# Patient Record
Sex: Male | Born: 1945
Health system: Southern US, Community
[De-identification: ages and names within clinical notes are randomized; demographics above are authoritative.]

## PROBLEM LIST (undated history)

## (undated) DIAGNOSIS — C4492 Squamous cell carcinoma of skin, unspecified: Secondary | ICD-10-CM

## (undated) DIAGNOSIS — R0989 Other specified symptoms and signs involving the circulatory and respiratory systems: Secondary | ICD-10-CM

## (undated) DIAGNOSIS — H269 Unspecified cataract: Secondary | ICD-10-CM

## (undated) DIAGNOSIS — E785 Hyperlipidemia, unspecified: Secondary | ICD-10-CM

## (undated) DIAGNOSIS — C4491 Basal cell carcinoma of skin, unspecified: Secondary | ICD-10-CM

## (undated) DIAGNOSIS — N529 Male erectile dysfunction, unspecified: Secondary | ICD-10-CM

## (undated) DIAGNOSIS — K219 Gastro-esophageal reflux disease without esophagitis: Secondary | ICD-10-CM

## (undated) DIAGNOSIS — G4730 Sleep apnea, unspecified: Secondary | ICD-10-CM

## (undated) DIAGNOSIS — E119 Type 2 diabetes mellitus without complications: Secondary | ICD-10-CM

## (undated) DIAGNOSIS — H409 Unspecified glaucoma: Secondary | ICD-10-CM

## (undated) DIAGNOSIS — T7840XA Allergy, unspecified, initial encounter: Secondary | ICD-10-CM

## (undated) DIAGNOSIS — I10 Essential (primary) hypertension: Secondary | ICD-10-CM

## (undated) HISTORY — DX: Type 2 diabetes mellitus without complications: E11.9

## (undated) HISTORY — PX: TONSILLECTOMY: SUR1361

## (undated) HISTORY — DX: Unspecified glaucoma: H40.9

## (undated) HISTORY — PX: POLYPECTOMY: SHX149

## (undated) HISTORY — DX: Essential (primary) hypertension: I10

## (undated) HISTORY — DX: Unspecified cataract: H26.9

## (undated) HISTORY — DX: Gastro-esophageal reflux disease without esophagitis: K21.9

## (undated) HISTORY — PX: CYSTECTOMY: SUR359

## (undated) HISTORY — PX: COLONOSCOPY: SHX174

## (undated) HISTORY — DX: Squamous cell carcinoma of skin, unspecified: C44.92

## (undated) HISTORY — DX: Other specified symptoms and signs involving the circulatory and respiratory systems: R09.89

## (undated) HISTORY — DX: Allergy, unspecified, initial encounter: T78.40XA

## (undated) HISTORY — PX: PILONIDAL CYST EXCISION: SHX744

## (undated) HISTORY — DX: Basal cell carcinoma of skin, unspecified: C44.91

## (undated) HISTORY — PX: INGUINAL HERNIA REPAIR: SUR1180

## (undated) HISTORY — DX: Male erectile dysfunction, unspecified: N52.9

## (undated) HISTORY — DX: Hyperlipidemia, unspecified: E78.5

---

## 2001-11-10 ENCOUNTER — Ambulatory Visit (HOSPITAL_BASED_OUTPATIENT_CLINIC_OR_DEPARTMENT_OTHER): Admission: RE | Admit: 2001-11-10 | Discharge: 2001-11-10 | Payer: Self-pay | Admitting: Family Medicine

## 2001-11-15 ENCOUNTER — Encounter: Payer: Self-pay | Admitting: Surgery

## 2001-11-17 ENCOUNTER — Ambulatory Visit (HOSPITAL_COMMUNITY): Admission: RE | Admit: 2001-11-17 | Discharge: 2001-11-17 | Payer: Self-pay | Admitting: Surgery

## 2003-12-26 ENCOUNTER — Ambulatory Visit: Payer: Self-pay | Admitting: Internal Medicine

## 2004-07-01 ENCOUNTER — Ambulatory Visit: Payer: Self-pay | Admitting: Internal Medicine

## 2004-07-08 ENCOUNTER — Ambulatory Visit: Payer: Self-pay | Admitting: Internal Medicine

## 2004-07-22 ENCOUNTER — Ambulatory Visit: Payer: Self-pay | Admitting: Internal Medicine

## 2004-12-05 ENCOUNTER — Ambulatory Visit: Payer: Self-pay | Admitting: Internal Medicine

## 2004-12-20 ENCOUNTER — Ambulatory Visit: Payer: Self-pay | Admitting: Internal Medicine

## 2005-01-22 ENCOUNTER — Ambulatory Visit: Payer: Self-pay | Admitting: Internal Medicine

## 2005-04-21 ENCOUNTER — Ambulatory Visit (HOSPITAL_BASED_OUTPATIENT_CLINIC_OR_DEPARTMENT_OTHER): Admission: RE | Admit: 2005-04-21 | Discharge: 2005-04-21 | Payer: Self-pay | Admitting: Surgery

## 2005-04-27 ENCOUNTER — Encounter (INDEPENDENT_AMBULATORY_CARE_PROVIDER_SITE_OTHER): Payer: Self-pay | Admitting: *Deleted

## 2005-04-27 ENCOUNTER — Ambulatory Visit (HOSPITAL_BASED_OUTPATIENT_CLINIC_OR_DEPARTMENT_OTHER): Admission: RE | Admit: 2005-04-27 | Discharge: 2005-04-27 | Payer: Self-pay | Admitting: Surgery

## 2005-11-26 ENCOUNTER — Ambulatory Visit: Payer: Self-pay | Admitting: Internal Medicine

## 2006-01-28 ENCOUNTER — Ambulatory Visit: Payer: Self-pay | Admitting: Internal Medicine

## 2006-01-28 LAB — CONVERTED CEMR LAB
ALT: 47 units/L — ABNORMAL HIGH (ref 0–40)
AST: 32 units/L (ref 0–37)
Albumin: 4.2 g/dL (ref 3.5–5.2)
Alkaline Phosphatase: 33 units/L — ABNORMAL LOW (ref 39–117)
BUN: 19 mg/dL (ref 6–23)
CO2: 26 meq/L (ref 19–32)
Calcium: 9.2 mg/dL (ref 8.4–10.5)
Chloride: 104 meq/L (ref 96–112)
Chol/HDL Ratio, serum: 5.8
Cholesterol: 191 mg/dL (ref 0–200)
Creatinine, Ser: 1.2 mg/dL (ref 0.4–1.5)
GFR calc non Af Amer: 66 mL/min
Glomerular Filtration Rate, Af Am: 79 mL/min/{1.73_m2}
Glucose, Bld: 103 mg/dL — ABNORMAL HIGH (ref 70–99)
HCT: 42.7 % (ref 39.0–52.0)
HDL: 32.8 mg/dL — ABNORMAL LOW (ref >39.0)
Hemoglobin: 14.5 g/dL (ref 13.0–17.0)
LDL Cholesterol: 135 mg/dL — ABNORMAL HIGH (ref 0–99)
MCHC: 33.9 g/dL (ref 30.0–36.0)
MCV: 94.5 fL (ref 78.0–100.0)
PSA: 1.13 ng/mL (ref 0.10–4.00)
Platelets: 219 10*3/uL (ref 150–400)
Potassium: 3.5 meq/L (ref 3.5–5.1)
RBC: 4.52 M/uL (ref 4.22–5.81)
RDW: 11.9 % (ref 11.5–14.6)
Sodium: 138 meq/L (ref 135–145)
TSH: 0.88 microintl units/mL (ref 0.35–5.50)
Testosterone, total: 2.8074 ng/mL — ABNORMAL LOW
Total Bilirubin: 0.8 mg/dL (ref 0.3–1.2)
Total Protein: 6.8 g/dL (ref 6.0–8.3)
Triglyceride fasting, serum: 118 mg/dL (ref 0–149)
VLDL: 24 mg/dL (ref 0–40)
WBC: 7.9 10*3/uL (ref 4.5–10.5)

## 2006-02-16 ENCOUNTER — Ambulatory Visit: Payer: Self-pay

## 2006-06-24 ENCOUNTER — Ambulatory Visit: Payer: Self-pay | Admitting: Family Medicine

## 2006-06-24 LAB — CONVERTED CEMR LAB
Basophils Absolute: 0.1 10*3/uL (ref 0.0–0.1)
Basophils Relative: 1 % (ref 0.0–1.0)
Eosinophils Absolute: 0.7 10*3/uL — ABNORMAL HIGH (ref 0.0–0.6)
Eosinophils Relative: 9.2 % — ABNORMAL HIGH (ref 0.0–5.0)
HCT: 39.7 % (ref 39.0–52.0)
Hemoglobin: 14.1 g/dL (ref 13.0–17.0)
Lymphocytes Relative: 31.4 % (ref 12.0–46.0)
MCHC: 35.4 g/dL (ref 30.0–36.0)
MCV: 92.2 fL (ref 78.0–100.0)
Monocytes Absolute: 0.7 10*3/uL (ref 0.2–0.7)
Monocytes Relative: 9.2 % (ref 3.0–11.0)
Neutro Abs: 3.6 10*3/uL (ref 1.4–7.7)
Neutrophils Relative %: 49.2 % (ref 43.0–77.0)
Platelets: 209 10*3/uL (ref 150–400)
RBC: 4.31 M/uL (ref 4.22–5.81)
RDW: 12.1 % (ref 11.5–14.6)
WBC: 7.5 10*3/uL (ref 4.5–10.5)

## 2006-06-29 DIAGNOSIS — E785 Hyperlipidemia, unspecified: Secondary | ICD-10-CM | POA: Insufficient documentation

## 2006-06-29 DIAGNOSIS — I10 Essential (primary) hypertension: Secondary | ICD-10-CM | POA: Insufficient documentation

## 2007-02-14 ENCOUNTER — Telehealth (INDEPENDENT_AMBULATORY_CARE_PROVIDER_SITE_OTHER): Payer: Self-pay | Admitting: *Deleted

## 2007-03-03 ENCOUNTER — Ambulatory Visit: Payer: Self-pay | Admitting: Internal Medicine

## 2007-03-05 ENCOUNTER — Telehealth (INDEPENDENT_AMBULATORY_CARE_PROVIDER_SITE_OTHER): Payer: Self-pay | Admitting: *Deleted

## 2007-03-05 ENCOUNTER — Ambulatory Visit: Payer: Self-pay | Admitting: Family Medicine

## 2007-03-09 ENCOUNTER — Telehealth: Payer: Self-pay | Admitting: Internal Medicine

## 2007-03-11 ENCOUNTER — Ambulatory Visit: Payer: Self-pay | Admitting: Internal Medicine

## 2007-03-16 ENCOUNTER — Encounter (INDEPENDENT_AMBULATORY_CARE_PROVIDER_SITE_OTHER): Payer: Self-pay | Admitting: *Deleted

## 2007-04-15 ENCOUNTER — Telehealth (INDEPENDENT_AMBULATORY_CARE_PROVIDER_SITE_OTHER): Payer: Self-pay | Admitting: *Deleted

## 2007-07-08 ENCOUNTER — Encounter (INDEPENDENT_AMBULATORY_CARE_PROVIDER_SITE_OTHER): Payer: Self-pay | Admitting: *Deleted

## 2007-08-01 ENCOUNTER — Ambulatory Visit: Payer: Self-pay | Admitting: Internal Medicine

## 2007-11-23 ENCOUNTER — Ambulatory Visit: Payer: Self-pay | Admitting: Internal Medicine

## 2008-02-06 ENCOUNTER — Encounter (INDEPENDENT_AMBULATORY_CARE_PROVIDER_SITE_OTHER): Payer: Self-pay | Admitting: *Deleted

## 2008-02-21 ENCOUNTER — Ambulatory Visit: Payer: Self-pay | Admitting: Internal Medicine

## 2008-04-19 ENCOUNTER — Ambulatory Visit: Payer: Self-pay | Admitting: Internal Medicine

## 2008-09-18 ENCOUNTER — Ambulatory Visit: Payer: Self-pay | Admitting: Internal Medicine

## 2008-09-18 LAB — HM COLONOSCOPY: HM Colonoscopy: NORMAL

## 2008-09-20 ENCOUNTER — Ambulatory Visit: Payer: Self-pay | Admitting: Internal Medicine

## 2008-09-21 ENCOUNTER — Encounter (INDEPENDENT_AMBULATORY_CARE_PROVIDER_SITE_OTHER): Payer: Self-pay | Admitting: *Deleted

## 2008-09-25 ENCOUNTER — Telehealth (INDEPENDENT_AMBULATORY_CARE_PROVIDER_SITE_OTHER): Payer: Self-pay | Admitting: *Deleted

## 2008-09-25 LAB — CONVERTED CEMR LAB
ALT: 38 units/L (ref 0–53)
AST: 24 units/L (ref 0–37)
BUN: 19 mg/dL (ref 6–23)
Basophils Absolute: 0 10*3/uL (ref 0.0–0.1)
Basophils Relative: 0.1 % (ref 0.0–3.0)
CO2: 27 meq/L (ref 19–32)
Calcium: 9 mg/dL (ref 8.4–10.5)
Chloride: 106 meq/L (ref 96–112)
Cholesterol: 173 mg/dL (ref 0–200)
Creatinine, Ser: 1.1 mg/dL (ref 0.4–1.5)
Eosinophils Absolute: 0.6 10*3/uL (ref 0.0–0.7)
Eosinophils Relative: 8 % — ABNORMAL HIGH (ref 0.0–5.0)
GFR calc non Af Amer: 71.79 mL/min (ref >60)
Glucose, Bld: 99 mg/dL (ref 70–99)
HCT: 43.5 % (ref 39.0–52.0)
HDL: 27.9 mg/dL — ABNORMAL LOW (ref >39.00)
Hemoglobin: 14.8 g/dL (ref 13.0–17.0)
LDL Cholesterol: 121 mg/dL — ABNORMAL HIGH (ref 0–99)
Lymphocytes Relative: 33.2 % (ref 12.0–46.0)
Lymphs Abs: 2.7 10*3/uL (ref 0.7–4.0)
MCHC: 34 g/dL (ref 30.0–36.0)
MCV: 94.1 fL (ref 78.0–100.0)
Monocytes Absolute: 1 10*3/uL (ref 0.1–1.0)
Monocytes Relative: 12.9 % — ABNORMAL HIGH (ref 3.0–12.0)
Neutro Abs: 3.7 10*3/uL (ref 1.4–7.7)
Neutrophils Relative %: 45.8 % (ref 43.0–77.0)
PSA: 1.01 ng/mL (ref 0.10–4.00)
Platelets: 184 10*3/uL (ref 150.0–400.0)
Potassium: 3.8 meq/L (ref 3.5–5.1)
RBC: 4.63 M/uL (ref 4.22–5.81)
RDW: 11.9 % (ref 11.5–14.6)
Sodium: 139 meq/L (ref 135–145)
TSH: 1.11 microintl units/mL (ref 0.35–5.50)
Total CHOL/HDL Ratio: 6
Triglycerides: 123 mg/dL (ref 0.0–149.0)
VLDL: 24.6 mg/dL (ref 0.0–40.0)
WBC: 8 10*3/uL (ref 4.5–10.5)

## 2008-10-30 ENCOUNTER — Ambulatory Visit: Payer: Self-pay | Admitting: Internal Medicine

## 2009-09-26 ENCOUNTER — Telehealth (INDEPENDENT_AMBULATORY_CARE_PROVIDER_SITE_OTHER): Payer: Self-pay | Admitting: *Deleted

## 2009-10-09 ENCOUNTER — Ambulatory Visit: Payer: Self-pay | Admitting: Internal Medicine

## 2009-10-09 DIAGNOSIS — L723 Sebaceous cyst: Secondary | ICD-10-CM | POA: Insufficient documentation

## 2009-10-09 DIAGNOSIS — N529 Male erectile dysfunction, unspecified: Secondary | ICD-10-CM | POA: Insufficient documentation

## 2009-10-11 ENCOUNTER — Ambulatory Visit: Payer: Self-pay | Admitting: Internal Medicine

## 2009-10-15 ENCOUNTER — Telehealth: Payer: Self-pay | Admitting: Internal Medicine

## 2009-10-17 LAB — CONVERTED CEMR LAB
ALT: 31 units/L (ref 0–53)
AST: 21 units/L (ref 0–37)
Albumin: 4.1 g/dL (ref 3.5–5.2)
Alkaline Phosphatase: 33 units/L — ABNORMAL LOW (ref 39–117)
BUN: 17 mg/dL (ref 6–23)
Basophils Absolute: 0.1 10*3/uL (ref 0.0–0.1)
Basophils Relative: 0.8 % (ref 0.0–3.0)
Bilirubin, Direct: 0.2 mg/dL (ref 0.0–0.3)
CO2: 26 meq/L (ref 19–32)
Calcium: 8.7 mg/dL (ref 8.4–10.5)
Chloride: 102 meq/L (ref 96–112)
Cholesterol: 185 mg/dL (ref 0–200)
Creatinine, Ser: 1.3 mg/dL (ref 0.4–1.5)
Eosinophils Absolute: 0.5 10*3/uL (ref 0.0–0.7)
Eosinophils Relative: 6.5 % — ABNORMAL HIGH (ref 0.0–5.0)
GFR calc non Af Amer: 60.61 mL/min (ref >60)
Glucose, Bld: 91 mg/dL (ref 70–99)
HCT: 40.7 % (ref 39.0–52.0)
HDL: 32.4 mg/dL — ABNORMAL LOW (ref >39.00)
Hemoglobin: 14 g/dL (ref 13.0–17.0)
LDL Cholesterol: 121 mg/dL — ABNORMAL HIGH (ref 0–99)
Lymphocytes Relative: 30.8 % (ref 12.0–46.0)
Lymphs Abs: 2.5 10*3/uL (ref 0.7–4.0)
MCHC: 34.4 g/dL (ref 30.0–36.0)
MCV: 94.8 fL (ref 78.0–100.0)
Monocytes Absolute: 1 10*3/uL (ref 0.1–1.0)
Monocytes Relative: 12.6 % — ABNORMAL HIGH (ref 3.0–12.0)
Neutro Abs: 4 10*3/uL (ref 1.4–7.7)
Neutrophils Relative %: 49.3 % (ref 43.0–77.0)
PSA: 1.09 ng/mL (ref 0.10–4.00)
Platelets: 190 10*3/uL (ref 150.0–400.0)
Potassium: 4.2 meq/L (ref 3.5–5.1)
RBC: 4.29 M/uL (ref 4.22–5.81)
RDW: 12.8 % (ref 11.5–14.6)
Sodium: 135 meq/L (ref 135–145)
TSH: 1 microintl units/mL (ref 0.35–5.50)
Total Bilirubin: 0.8 mg/dL (ref 0.3–1.2)
Total CHOL/HDL Ratio: 6
Total Protein: 6.3 g/dL (ref 6.0–8.3)
Triglycerides: 159 mg/dL — ABNORMAL HIGH (ref 0.0–149.0)
VLDL: 31.8 mg/dL (ref 0.0–40.0)
WBC: 8.2 10*3/uL (ref 4.5–10.5)

## 2010-03-09 LAB — CONVERTED CEMR LAB
ALT: 33 units/L (ref 0–53)
AST: 19 units/L (ref 0–37)
Albumin: 4.3 g/dL (ref 3.5–5.2)
Alkaline Phosphatase: 37 units/L — ABNORMAL LOW (ref 39–117)
BUN: 16 mg/dL (ref 6–23)
Basophils Absolute: 0 10*3/uL (ref 0.0–0.1)
Basophils Relative: 0 % (ref 0.0–1.0)
Bilirubin Urine: NEGATIVE
Bilirubin, Direct: 0.1 mg/dL (ref 0.0–0.3)
Blood in Urine, dipstick: NEGATIVE
CO2: 29 meq/L (ref 19–32)
Calcium: 9.4 mg/dL (ref 8.4–10.5)
Chloride: 104 meq/L (ref 96–112)
Cholesterol: 187 mg/dL (ref 0–200)
Creatinine, Ser: 1.1 mg/dL (ref 0.4–1.5)
Eosinophils Absolute: 0.3 10*3/uL (ref 0.0–0.6)
Eosinophils Relative: 1.8 % (ref 0.0–5.0)
GFR calc Af Amer: 88 mL/min
GFR calc non Af Amer: 72 mL/min
Glucose, Bld: 94 mg/dL (ref 70–99)
Glucose, Urine, Semiquant: NEGATIVE
HCT: 44.6 % (ref 39.0–52.0)
HDL: 31.8 mg/dL — ABNORMAL LOW (ref >39.0)
Hemoglobin: 15.3 g/dL (ref 13.0–17.0)
Ketones, urine, test strip: NEGATIVE
LDL Cholesterol: 131 mg/dL — ABNORMAL HIGH (ref 0–99)
Lymphocytes Relative: 35 % (ref 12.0–46.0)
MCHC: 34.4 g/dL (ref 30.0–36.0)
MCV: 91.8 fL (ref 78.0–100.0)
Monocytes Absolute: 1.8 10*3/uL — ABNORMAL HIGH (ref 0.2–0.7)
Monocytes Relative: 11.8 % — ABNORMAL HIGH (ref 3.0–11.0)
Neutro Abs: 7.6 10*3/uL (ref 1.4–7.7)
Neutrophils Relative %: 51.4 % (ref 43.0–77.0)
Nitrite: NEGATIVE
PSA: 1.68 ng/mL (ref 0.10–4.00)
Platelets: 280 10*3/uL (ref 150–400)
Potassium: 4 meq/L (ref 3.5–5.1)
Protein, U semiquant: NEGATIVE
RBC: 4.85 M/uL (ref 4.22–5.81)
RDW: 11.8 % (ref 11.5–14.6)
Sodium: 140 meq/L (ref 135–145)
Specific Gravity, Urine: 1.025
TSH: 1.39 microintl units/mL (ref 0.35–5.50)
Total Bilirubin: 1 mg/dL (ref 0.3–1.2)
Total CHOL/HDL Ratio: 5.9
Total Protein: 7.1 g/dL (ref 6.0–8.3)
Triglycerides: 120 mg/dL (ref 0–149)
Urobilinogen, UA: NEGATIVE
VLDL: 24 mg/dL (ref 0–40)
WBC Urine, dipstick: NEGATIVE
WBC: 14.9 10*3/uL — ABNORMAL HIGH (ref 4.5–10.5)
pH: 5

## 2010-03-11 NOTE — Progress Notes (Signed)
Summary: SURGICAL REFERRAL  ---- Converted from flag ---- ---- 10/14/2009 10:32 AM, Jose E. Paz MD wrote: in the back, by the shoulder blade  ---- 10/11/2009 2:29 PM, Magdalen Spatz [facility] wrote: Dr. Drue Novel,  The Sebaceous Cyst, is it on the patient's back, or on the back of his knee?  The referral says back, the office notes have back in one section, then "knee back" in another.  Thank you, Renee ------------------------------

## 2010-03-11 NOTE — Progress Notes (Signed)
Summary: refills   Phone Note Outgoing Call   Summary of Call: ok #30 days and 1 RF all meds tell patient is due fpr a CPX, schedule one no further RF w/o OV Jose E. Paz MD  September 26, 2009 1:00 PM   Follow-up for Phone Call        refilled all of pts meds- will you schedule cpx. Army Fossa CMA  September 26, 2009 1:06 PM   Additional Follow-up for Phone Call Additional follow up Details #1::        called home number but pt was not home. will try again later.Lavell Islam  September 26, 2009 3:11 PM    Additional Follow-up for Phone Call Additional follow up Details #2::    pt has appt for cpx 8/31.Lavell Islam  October 01, 2009 11:57 AM

## 2010-03-11 NOTE — Assessment & Plan Note (Signed)
Summary: cpx/kn   Vital Signs:  Patient profile:   65 year old male Height:      69 inches Weight:      232.38 pounds BMI:     34.44 Pulse rate:   66 / minute Pulse rhythm:   regular BP sitting:   142 / 80  (left arm) Cuff size:   large  Vitals Entered By: Army Fossa CMA (October 09, 2009 1:23 PM) CC: CPX: not fasting   History of Present Illness: CPX has a sebaceous cyst in the back that is giving him problems, like a referral for excision  Preventive Screening-Counseling & Management  Caffeine-Diet-Exercise     Does Patient Exercise: yes     Type of exercise: walking- 2 miles     Times/week: 7  Current Medications (verified): 1)  Amlodipine Besylate 5 Mg  Tabs (Amlodipine Besylate) .Marland Kitchen.. 1 By Mouth Once Daily 2)  Avapro 300 Mg  Tabs (Irbesartan) .Marland Kitchen.. 1 By Mouth Once Daily 3)  Tricor 145 Mg  Tabs (Fenofibrate) .Marland Kitchen.. 1 By Mouth Once Daily 4)  Metoprolol Tartrate 50 Mg  Tabs (Metoprolol Tartrate) .Marland Kitchen.. 1 By Mouth Bid  Allergies (verified): No Known Drug Allergies  Past History:  Past Medical History: Reviewed history from 03/11/2007 and no changes required. Hyperlipidemia Hypertension decreased L carotid pulse (?) : Nl carotidu/s 02-2006  Past Surgical History: Reviewed history from 02/21/2008 and no changes required. SLEEP STUDY (2003): neg Pilonidal cyst Inguinal herniorrhaphy  (L)   Family History: M - deceased age 64 pneumonia/dementia F - deceased age 26 coronary insuff CAD - GF, F Valve replacement (due to Ca++) -- F age 12s  DM - no colon ca - no prostate ca - no  Social History: Married 2 kids 5 grandkids retired  Alcohol use-yes (couple beers a night) Never Smoked Drug use-no Regular exercise-yes (walks every  day ) exercise-- walks almost daily, swimms 2-3 /weekDoes Patient Exercise:  yes  Review of Systems General:  Denies fatigue, fever, and weight loss. CV:  Denies chest pain or discomfort and swelling of feet. Resp:   Denies cough and shortness of breath. GI:  Denies bloody stools. GU:  Denies dysuria, hematuria, urinary frequency, and urinary hesitancy; some drecreased libido and ED nocturia x 3 , not bother by that . Psych:  Denies anxiety and depression.  Physical Exam  General:  alert and well-developed.   Neck:  no masses and no thyromegaly.   Lungs:  normal respiratory effort, no intercostal retractions, no accessory muscle use, and normal breath sounds.   Heart:  normal rate, regular rhythm, no murmur, no gallop, and no rub.   Abdomen:  soft, non-tender, no distention, no masses, no guarding, and no rigidity.   Rectal:  external hemorrhoid  noted. Normal sphincter tone. No rectal masses or tenderness. hemocult neg Prostate:  Prostate gland firm and smooth, no enlargement, nodularity, tenderness, mass, asymmetry or induration. Extremities:  no edema Skin:  has a mass consistent with a sebaceous cyst in the knee back, size approximately 1 inch. Has similar but smaller cyst proximal from it  Psych:  Cognition and judgment appear intact. Alert and cooperative with normal attention span and concentration.     Impression & Recommendations:  Problem # 1:  HEALTH SCREENING (ICD-V70.0) Td 09-2008 Zostavax 02-2008 flu shot --today  Colonoscopy:    Date:  01/09/2002    Next Due:  01/2012   Results:  normal  hemocult neg today, no iFOB available today, advised him to call  in 1 month   continue with his healthy lifestyle, add ASA   Problem # 2:  HYPERTENSION (ICD-401.9) at goal  His updated medication list for this problem includes:    Amlodipine Besylate 5 Mg Tabs (Amlodipine besylate) .Marland Kitchen... 1 by mouth once daily    Avapro 300 Mg Tabs (Irbesartan) .Marland Kitchen... 1 by mouth once daily    Metoprolol Tartrate 50 Mg Tabs (Metoprolol tartrate) .Marland Kitchen... 1 by mouth bid  BP today: 142/80 Prior BP: 126/80 (09/18/2008)  Labs Reviewed: K+: 3.8 (09/20/2008) Creat: : 1.1 (09/20/2008)   Chol: 173 (09/20/2008)    HDL: 27.90 (09/20/2008)   LDL: 121 (09/20/2008)   TG: 123.0 (09/20/2008)  Problem # 3:  HYPERLIPIDEMIA (ICD-272.4)  His updated medication list for this problem includes:    Tricor 145 Mg Tabs (Fenofibrate) .Marland Kitchen... 1 by mouth once daily  Labs Reviewed: SGOT: 24 (09/20/2008)   SGPT: 38 (09/20/2008)   HDL:27.90 (09/20/2008), 31.8 (03/11/2007)  LDL:121 (09/20/2008), 131 (03/11/2007)  Chol:173 (09/20/2008), 187 (03/11/2007)  Trig:123.0 (09/20/2008), 120 (03/11/2007)  Problem # 4:  SEBACEOUS CYST (ICD-706.2)  Referred to Dr. Margaree Mackintosh that knows  the patient already  Orders: Surgical Referral (Surgery)  Problem # 5:  ERECTILE DYSFUNCTION, ORGANIC (ICD-607.84) some decreased libido and ED Patient believes related to blood pressure medication, offered   to switch around his medicines to help him. The patient states is not a major problem, prefers to stay on the same medicines and start fish oil daily which helped in the past  Complete Medication List: 1)  Amlodipine Besylate 5 Mg Tabs (Amlodipine besylate) .Marland Kitchen.. 1 by mouth once daily 2)  Avapro 300 Mg Tabs (Irbesartan) .Marland Kitchen.. 1 by mouth once daily 3)  Tricor 145 Mg Tabs (Fenofibrate) .Marland Kitchen.. 1 by mouth once daily 4)  Metoprolol Tartrate 50 Mg Tabs (Metoprolol tartrate) .Marland Kitchen.. 1 by mouth bid 5)  Aspirin 81 Mg Tbec (Aspirin) .Marland Kitchen.. 1 a day  Other Orders: Admin 1st Vaccine (45409) Flu Vaccine 58yrs + (81191)  Patient Instructions: 1)  please come back fasting within one week 2)  FLP, BMP, CBC, LFTs, TSH, PSA----dx V70 3)  Check your blood pressure 2 or 3 times a week. If it is more than 140/85 consistently,please let us know  4)  Please schedule a follow-up appointment in 1 year.  Prescriptions: METOPROLOL TARTRATE 50 MG  TABS (METOPROLOL TARTRATE) 1 by mouth bid  #180 x 3   Entered and Authorized by:   Elita Quick E. Paz MD   Signed by:   Nolon Rod. Paz MD on 10/09/2009   Method used:   Electronically to        [facility] [phone]* (retail)        [address]       [place], [place]  [address]       Ph: [phone]       Fax: [phone]   RxID:   [id number] TRICOR 145 MG  TABS (FENOFIBRATE) 1 by mouth once daily  #90 x 3   Entered and Authorized by:   Nolon Rod. Paz MD   Signed by:   Nolon Rod. Paz MD on 10/09/2009   Method used:   Electronically to        [facility] [phone]* (retail)       [address]       [place], [place]  [address]       Ph: [phone]       Fax: [phone]   RxID:  [id number] AVAPRO 300 MG  TABS (IRBESARTAN) 1 by mouth once daily  #90 x 3   Entered and Authorized by:   Elita Quick E. Paz MD   Signed by:   Nolon Rod. Paz MD on 10/09/2009   Method used:   Electronically to        [facility] [phone]* (retail)       [address]       [place], [place]  [address]       Ph: [phone]       Fax: [phone]   RxID:   [id number] AMLODIPINE BESYLATE 5 MG  TABS (AMLODIPINE BESYLATE) 1 by mouth once daily  #90 x 3   Entered and Authorized by:   Nolon Rod. Paz MD   Signed by:   Nolon Rod. Paz MD on 10/09/2009   Method used:   Electronically to        [facility] [phone]* (retail)       [address]       [place], [place]  [address]       Ph: [phone]       Fax: [phone]   RxID:   [id number] METOPROLOL TARTRATE 50 MG  TABS (METOPROLOL TARTRATE) 1 by mouth bid  #60 Tablet x 11   Entered by:   Army Fossa CMA   Authorized by:   Nolon Rod. Paz MD   Signed by:   Army Fossa CMA on 10/09/2009   Method used:   Electronically to        [facility] [phone]* (retail)       [address]       [place], [place]  [address]       Ph: [phone]       Fax: [phone]   RxID:   [id number] TRICOR 145 MG  TABS (FENOFIBRATE) 1 by mouth once daily  #30 Tablet x 11   Entered by:   Army Fossa CMA   Authorized by:   Nolon Rod. Paz MD   Signed by:   Army Fossa CMA on 10/09/2009   Method used:   Electronically to        [facility] [phone]* (retail)       [address]        [place], [place]  [address]       Ph: [phone]       Fax: [phone]   RxID:   [id number] AVAPRO 300 MG  TABS (IRBESARTAN) 1 by mouth once daily  #30 Tablet x 11   Entered by:   Army Fossa CMA   Authorized by:   Nolon Rod. Paz MD   Signed by:   Army Fossa CMA on 10/09/2009   Method used:   Electronically to        [facility] [phone]* (retail)       [address]       [place], [place]  [address]       Ph: [phone]       Fax: [phone]   RxID:   [id number] AMLODIPINE BESYLATE 5 MG  TABS (AMLODIPINE BESYLATE) 1 by mouth once daily  #30 Tablet x 11   Entered by:   Army Fossa CMA   Authorized by:   Nolon Rod. Paz MD   Signed by:   Army Fossa CMA on 10/09/2009   Method used:   Electronically to        [facility]  [address] [phone]* (retail)       [address]       [place], [place]  [address]       Ph: [phone]       Fax: [phone]   RxID:   [id number]  Flu Vaccine Consent Questions     Do you have a history of severe allergic reactions to this vaccine? no    Any prior history of allergic reactions to egg and/or gelatin? no    Do you have a sensitivity to the preservative Thimersol? no    Do you have a past history of Guillan-Barre Syndrome? no    Do you currently have an acute febrile illness? no    Have you ever had a severe reaction to latex? no    Vaccine information given and explained to patient? yes    Are you currently pregnant? no    Lot Number:[id number]   Exp Date:08/09/2010   Site Given  Left Deltoid [id number]   RxID:   [id number]    .lbflu   Risk Factors:  Exercise:  yes    Times per week:  7    Type:  walking- 2 miles   Appended Document: cpx/kn Correction: Skin:  has a mass consistent with a sebaceous cyst in the mid-upper  back (by the scapula)  , size approximately 1 inch. Has similar but smaller cyst proximal from it

## 2010-05-26 ENCOUNTER — Telehealth: Payer: Self-pay | Admitting: *Deleted

## 2010-05-26 NOTE — Telephone Encounter (Signed)
Please call the patient, if he likes to switch Avapro is okay with me, we can try losartan 100 mg one daily. If he switches, he will need a nurse visit 3 weeks after that change for a BMP and a BP check.

## 2010-05-26 NOTE — Telephone Encounter (Signed)
[facility] called and stated that Avapro is no longer covered by pts insurance. Please give an alternative med?

## 2010-05-27 MED ORDER — LOSARTAN POTASSIUM 100 MG PO TABS: 100.0000 mg | ORAL_TABLET | Freq: Every day | ORAL | Status: DC

## 2010-05-27 NOTE — Telephone Encounter (Signed)
Spoke w/ pt he is aware, would like to switch. Appt scheduled.

## 2010-06-17 ENCOUNTER — Ambulatory Visit (INDEPENDENT_AMBULATORY_CARE_PROVIDER_SITE_OTHER): Payer: Medicare HMO | Admitting: *Deleted

## 2010-06-17 VITALS — BP 120/68 | Wt 223.0 lb

## 2010-06-17 DIAGNOSIS — I10 Essential (primary) hypertension: Secondary | ICD-10-CM

## 2010-06-17 LAB — BASIC METABOLIC PANEL
Chloride: 106 mEq/L (ref 96–112)
Creatinine, Ser: 0.9 mg/dL (ref 0.4–1.5)
GFR: 88.86 mL/min (ref >60.00)
Potassium: 4.1 mEq/L (ref 3.5–5.1)

## 2010-06-19 ENCOUNTER — Encounter: Payer: Self-pay | Admitting: Family Medicine

## 2010-06-19 ENCOUNTER — Ambulatory Visit (INDEPENDENT_AMBULATORY_CARE_PROVIDER_SITE_OTHER): Payer: Medicare HMO | Admitting: Family Medicine

## 2010-06-19 VITALS — BP 130/72 | HR 59 | Temp 97.8°F | Wt 226.4 lb

## 2010-06-19 DIAGNOSIS — J40 Bronchitis, not specified as acute or chronic: Secondary | ICD-10-CM

## 2010-06-19 MED ORDER — AMOXICILLIN-POT CLAVULANATE 875-125 MG PO TABS: 1.0000 | ORAL_TABLET | Freq: Two times a day (BID) | ORAL | Status: AC

## 2010-06-19 NOTE — Patient Instructions (Signed)
Bronchitis Bronchitis is the body's way of reacting to injury and/or infection (inflammation) of the bronchi. Bronchi are the air tubes that extend from the windpipe into the lungs. If the inflammation becomes severe, it may cause shortness of breath.  CAUSES Inflammation may be caused by:  A virus.   Germs (bacteria).   Dust.   Allergens.   Pollutants and many other irritants.  The cells lining the bronchial tree are covered with tiny hairs (cilia). These constantly beat upward, away from the lungs, toward the mouth. This keeps the lungs free of pollutants. When these cells become too irritated and are unable to do their job, mucus begins to develop. This causes the characteristic cough of bronchitis. The cough clears the lungs when the cilia are unable to do their job. Without either of these protective mechanisms, the mucus would settle in the lungs. Then you would develop pneumonia. Smoking is a common cause of bronchitis and can contribute to pneumonia. Stopping this habit is the single most important thing you can do to help yourself. TREATMENT  Your caregiver may prescribe an antibiotic if the cough is caused by bacteria. Also, medicines that open up your airways make it easier to breathe. Your caregiver may also recommend or prescribe an expectorant. It will loosen the mucus to be coughed up. Only take over-the-counter or prescription medicines for pain, discomfort, or fever as directed by your caregiver.   Removing whatever causes the problem (smoking, for example) is critical to preventing the problem from getting worse.   Cough suppressants may be prescribed for relief of cough symptoms.   Inhaled medicines may be prescribed to help with symptoms now and to help prevent problems from returning.   For those with recurrent (chronic) bronchitis, there may be a need for steroid medicines.  SEEK IMMEDIATE MEDICAL CARE IF:  During treatment, you develop more pus-like mucus  (purulent sputum).   You or your child has an oral temperature above 100.4, not controlled by medicine.   Your baby is older than 3 months with a rectal temperature of 102 F (38.9 C) or higher.   Your baby is 3 months old or younger with a rectal temperature of 100.4 F (38 C) or higher.   You become progressively more ill.   You have increased difficulty breathing, wheezing, or shortness of breath.  It is necessary to seek immediate medical care if you are elderly or sick from any other disease. MAKE SURE YOU:  Understand these instructions.   Will watch your condition.   Will get help right away if you are not doing well or get worse.  Document Released: 01/26/2005 Document Re-Released: 04/22/2009 [employer] Patient Information 2011 [employer]. 

## 2010-06-19 NOTE — Progress Notes (Signed)
  Subjective:     Jeffery Cline is a 65 y.o. male who presents for evaluation of sinus pain. Symptoms include: congestion, cough, facial pain, nasal congestion, sinus pressure and productive cough. Onset of symptoms was 7 days ago. Symptoms have been gradually worsening since that time. Past history is significant for asthma as child and hx bronchitis. Patient is a non-smoker.  The following portions of the patient's history were reviewed and updated as appropriate: allergies, current medications, past family history, past medical history, past social history, past surgical history and problem list.  Review of Systems Pertinent items are noted in HPI.   Objective:     General appearance: alert, cooperative, appears stated age and no distress Ears: normal TM's and external ear canals both ears Nose: Nares normal. Septum midline. Mucosa normal. No drainage or sinus tenderness. Throat: abnormal findings: mild oropharyngeal erythema Neck: mild anterior cervical adenopathy and thyroid not enlarged, symmetric, no tenderness/mass/nodules Lungs: wheezes mostly right side Heart: regular rate and rhythm, S1, S2 normal, no murmur, click, rub or gallop Extremities: extremities normal, atraumatic, no cyanosis or edema Lymph nodes: Cervical adenopathy:     Assessment:    Acute bacterial bronchitis  Plan:    Augmentin per medication orders. mucinex  F/u 2 weeks or sooner prn

## 2010-06-27 NOTE — Op Note (Signed)
NAME:  Jeffery Cline, Jeffery Cline             ACCOUNT NO.:  [id number]   MEDICAL RECORD NO.:  [id number]          PATIENT TYPE:  AMB   LOCATION:  DSC                          FACILITY:  [facility]   PHYSICIAN:  Wilmon Arms. Corliss Skains, M.D. DATE OF BIRTH:  [birth year]   DATE OF PROCEDURE:  04/27/2005  DATE OF DISCHARGE:                                 OPERATIVE REPORT   PREOPERATIVE DIAGNOSIS:  Multiple sebaceous cysts from the back, neck and  buttocks.   POSTOPERATIVE DIAGNOSIS:  Multiple sebaceous cysts from the back, neck and  buttocks.   PROCEDURE PERFORMED:  Excision of four sebaceous cysts.   SURGEON:  Wilmon Arms. Corliss Skains, M.D.   ASSISTANT:  None.   ANESTHESIA:  Local MAC.   INDICATIONS:  The patient is a 65 year old male with an extensive history of  skin problems, who had had a previous sebaceous cyst removed from the  posterior left shoulder several years ago. This has recurred. He also has a  smaller one in the mid back. He has a tiny one on the left side of his neck.  There is also a lump in his right buttock which has been enlarging. He  requests excision of all these. The one in his buttock was unclear whether  it was a lipoma or sebaceous cyst.   DESCRIPTION OF PROCEDURE:  The patient brought to the operating room, placed  in a prone position on the operating room table. After adequate level of  intravenous sedation was given, a time-out was taken. His right buttock was  then prepped with Betadine and draped in sterile fashion. A transverse  incision was made directly over the mass. Dissection was carried down  through subcutaneous tissues with cautery. The mass was encountered and was  found to be a large sebaceous cyst measuring proximally 2 cm in diameter.  This was chronically inflamed. The cyst was completely excised with cautery.  Hemostasis was obtained with cautery. The specimen was sent for pathologic  examination. The wound was closed with a deep layer of 3-0 Vicryl and a  subcuticular layer of 4-0 Monocryl. Steri-Strips were applied. The patient's  back was then prepped with Betadine and draped in sterile fashion. We began  with the small midline sebaceous cyst. This area was infiltrated with 0.25%  Marcaine as well. An elliptical incision was made around the opening in the  skin. Sharp dissection was used to excise the small cyst entirely.  Hemostasis obtained with cautery and the wound was closed with interrupted  vertical mattress sutures of 3-0 nylon. The one behind his left shoulder was  also anesthetized with 0.25% Marcaine. A transverse incision was made over  the cyst. Pus was encountered immediately. The entire cyst was proximally 3  cm in diameter. This was opened and I excised as much of the cyst wall that  I could see. Hemostasis was obtained with cautery. The wound was irrigated  and packed with 2 x 2 gauze. His left neck was then prepped with Betadine,  anesthetized with 0.25%  Marcaine. A tiny elliptical incision was made around the palpable nodule.  This also turned out  to be a small sebaceous cyst. This was excised and  hemostasis was obtained with cautery. The wound was closed with a single 3-0  nylon. The patient was awakened and brought to the recovery room in stable  condition.      Wilmon Arms. Tsuei, M.D.  Electronically Signed     MKT/MEDQ  D:  04/27/2005  T:  04/28/2005  Job:  [id number]

## 2010-06-27 NOTE — Op Note (Signed)
NAME:  Jeffery Cline, Jeffery Cline                       ACCOUNT NO.:  [id number]   MEDICAL RECORD NO.:  [id number]                   PATIENT TYPE:  AMB   LOCATION:  DAY                                  FACILITY:  [facility]   PHYSICIAN:  Abigail Miyamoto, MD                DATE OF BIRTH:  Nov 19, 1945   DATE OF PROCEDURE:  11/17/2001  DATE OF DISCHARGE:                                 OPERATIVE REPORT   PREOPERATIVE DIAGNOSES:  1. Right inguinal hernia.  2. Sebaceous cysts, left posterior ear and left upper back.   PROCEDURES:  1. Laparoscopic right inguinal hernia repair with mesh.  2. Excision of small sebaceous cysts behind left ear and left upper back.   SURGEON:  Douglas A. Magnus Ivan, M.D.   ANESTHESIA:  General endotracheal anesthesia.   ESTIMATED BLOOD LOSS:  Minimal.   PROCEDURE IN DETAIL:  The patient was brought to the operating room and  identified as Jeffery Cline.  He was placed supine on the operating table,  and general anesthesia was induced.  His abdomen and groin area was then  prepped and draped in the usual sterile fashion.  Using a #15 blade, a small  transverse incision was made below the umbilicus.  The incision was carried  down to the fascia which was then opened with a scalpel.  The rectus muscles  were identified and elevated.  The dissecting balloon was then passed  underneath the rectus sheath and manipulated toward the pelvis.  The  dissecting balloon was then insufflated, dissecting out the preperitoneal  space.  This was done under direct vision.  The dissecting balloon was then  removed, and a smaller balloon pole was placed through the umbilical  incision.  Insufflation of the preperitoneal space was then begun with CO2.  Next, two 5 mm ports were placed in the patient's midline under direct  vision.  Dissection was then carried down in the inguinal area.  Cooper's  ligament was easily identified.  The patient was found to have a large  direct inguinal  hernia defect.  The inferior epigastric vessels were well-  protected.  Testicular cord and structures were easily identified, and  dissection was carried out lateral to them.  The cord structures were  examined, and no hernia was seen to be found in the cord structures.  Once  the inguinal floor was dissected out, a piece of large Bard precut mesh was  brought onto the field.  A suture was placed in the mesh for pulling in the  abdominal cavity to help with holding the mesh.  The mesh was then placed  through the umbilical port.  The mesh was then opened up into the inguinal  floor area.  It was then tacked in place to Cooper's ligament, up the medial  abdominal wall, and then out laterally, covering a large direct hernia  defect as well as the cord structures in an onlay  fashion.  Excellent  coverage of the defect appeared to be achieved.  At this point, all ports  were removed, and the preperitoneal space was deflated under direct vision.  The mesh appeared to lay appropriately.  Next, the umbilical port was  removed as well.  The fascia of the umbilicus was then closed with a figure-  of-eight 0 Vicryl suture.  All incision were then anesthetized and closed  with 4-0 Monocryl subcuticular sutures.  Next, an elliptical incision was  made around the small mass behind the left ear.  This was used to completely  excise the superficial sebaceous cyst.  The wound was then closed with  single interrupted 4-0 Monocryl suture.  Dermabond and then Steri-Strips  were applied.  Next, the patient was turned up on his right side slightly.  The left upper back cyst was then identified.  A single stitch was made over  the cyst, and dissection was carried down through the cyst until it had been  completely excised with the capsule in its entirety.  The wound was then  irrigated with saline and anesthetized with Marcaine.  The wound was then  closed with interrupted 4-0  Monocryl sutures.  Dermabond,  Steri-Strips were then placed.  Gauze was  placed on the wound also.  The patient tolerated the procedure well.  All  sponge, needle, and instrument counts were correct at the end of the  procedure.  The patient was then extubated in the operating room and taken  in stable condition to the recovery room.                                               Abigail Miyamoto, MD    DB/MEDQ  D:  11/17/2001  T:  11/17/2001  Job:  [id number]

## 2010-08-02 ENCOUNTER — Ambulatory Visit (INDEPENDENT_AMBULATORY_CARE_PROVIDER_SITE_OTHER): Payer: Medicare HMO | Admitting: Family Medicine

## 2010-08-02 ENCOUNTER — Encounter: Payer: Self-pay | Admitting: Family Medicine

## 2010-08-02 DIAGNOSIS — T7840XA Allergy, unspecified, initial encounter: Secondary | ICD-10-CM

## 2010-08-02 DIAGNOSIS — J45909 Unspecified asthma, uncomplicated: Secondary | ICD-10-CM

## 2010-08-02 MED ORDER — PREDNISONE 20 MG PO TABS: ORAL_TABLET | ORAL | Status: DC

## 2010-08-02 NOTE — Patient Instructions (Signed)
Take the prednisone as directed..if y sym do not improve           i would rec. A allergy w/up w Dr. Willa Rough

## 2010-08-02 NOTE — Progress Notes (Signed)
  Subjective:    Patient ID: Jeffery Cline, male    DOB: 08-Feb-1946, 65 y.o.   MRN: [id number]  HPI  65 y/o male non-smoker.h/o of  AR and asthma,childhood,who comes in w 3 week h/o of cough.  rx w augmentin and theb self rx w wifes amoxil w no improvement in sym.  No fever.     Review of Systems gen and pulm ros neg    Objective:   Physical Exam wdwn male nad heent neg exp. 3plus nasal edema Chest clear no wheezing       Assessment & Plan:  AR, and rad,,,,,,,,pred B/T,,,,,,,,,,,,,in the future do not take atb unless doc. Bact. infection

## 2010-08-20 ENCOUNTER — Encounter: Payer: Self-pay | Admitting: Internal Medicine

## 2010-08-20 ENCOUNTER — Ambulatory Visit (INDEPENDENT_AMBULATORY_CARE_PROVIDER_SITE_OTHER): Payer: Medicare HMO | Admitting: Internal Medicine

## 2010-08-20 VITALS — BP 134/86 | HR 61 | Wt 225.0 lb

## 2010-08-20 DIAGNOSIS — J45909 Unspecified asthma, uncomplicated: Secondary | ICD-10-CM

## 2010-08-20 DIAGNOSIS — I10 Essential (primary) hypertension: Secondary | ICD-10-CM

## 2010-08-20 MED ORDER — AZITHROMYCIN 250 MG PO TABS: ORAL_TABLET | ORAL | Status: AC

## 2010-08-20 NOTE — Progress Notes (Signed)
  Subjective:    Patient ID: Jeffery Cline, male    DOB: [birth year], 65 y.o.   MRN: [id number]  HPI Symptoms started 6 weeks ago with what seemed to be a cold: Chest congestion, green sputum production, some sore throat. He has been seen twice, prescribed augmentin  and more recently prednisone. Most symptoms resolved but he continued to have a non- productive cough; he does have some chest congestion and white/clear sputum production early n the morning. No wheezing per se Also, we switch from avapro to losartan in April, BP is well controlled, recent BMP normal. He feels great, much better than when she was taking avapro. Wonders if cough may be related to new BP med.  Past Medical History  Diagnosis Date  . Hyperlipidemia   . Hypertension    Past Surgical History  Procedure Date  . Pilonidal cyst excision   . Inguinal hernia repair       Review of Systems No GERD symptoms Mild postnasal dripping nose congestion which is nothing new to him. Hystoricaly , Advair has helped with similar symptoms.     Objective:   Physical Exam  Constitutional: He appears well-developed and well-nourished.  HENT:  Head: Normocephalic and atraumatic.  Right Ear: External ear normal.  Left Ear: External ear normal.  Nose: Nose normal.  Mouth/Throat: No oropharyngeal exudate.  Cardiovascular: Normal rate, regular rhythm and normal heart sounds.   No murmur heard. Pulmonary/Chest: Effort normal and breath sounds normal. No respiratory distress. He has no wheezes. He has no rales.  Musculoskeletal: He exhibits no edema.          Assessment & Plan:

## 2010-08-20 NOTE — Patient Instructions (Addendum)
For cough, take Mucinex DM twice a day as needed  Take the antibiotic as prescribed, zithromax symbicort 80/4.5 2 puffs twice a day until samples gone Call if no better in few days Call anytime if the symptoms are severe, you have high fever, short of breath or if symptoms resurface

## 2010-08-20 NOTE — Assessment & Plan Note (Signed)
Switch from avapro to losartan/2012, since then he feels great and BP is well-controlled. Had a BMP, it was normal. No change for now

## 2010-08-20 NOTE — Assessment & Plan Note (Addendum)
peristent cough x 6 weeks after a cold H/o asthma as a child, some chest congestion and early morning sputum per history. Historically he responded well to advair in the past to similar situation See instructions Is possible but unlikely that the cough may be triggered by losartan. Will wait and see how he does. Also rec a CXR, pt somehow reluctant to get it done

## 2010-08-21 ENCOUNTER — Ambulatory Visit (INDEPENDENT_AMBULATORY_CARE_PROVIDER_SITE_OTHER)
Admission: RE | Admit: 2010-08-21 | Discharge: 2010-08-21 | Disposition: A | Discharge status: Home / Self Care | Payer: Medicare HMO | Source: Ambulatory Visit | Attending: Internal Medicine | Admitting: Internal Medicine

## 2010-08-21 DIAGNOSIS — J45909 Unspecified asthma, uncomplicated: Secondary | ICD-10-CM

## 2010-08-26 ENCOUNTER — Telehealth: Payer: Self-pay | Admitting: *Deleted

## 2010-08-26 DIAGNOSIS — R059 Cough, unspecified: Secondary | ICD-10-CM

## 2010-08-26 DIAGNOSIS — R05 Cough: Secondary | ICD-10-CM

## 2010-08-26 NOTE — Telephone Encounter (Signed)
Pt called and states he felt his cough was getting better yesterday. He said this am it is worse. He said it has been going for 6 weeks, should his BP medication be changed?

## 2010-08-27 NOTE — Telephone Encounter (Signed)
Pt is aware.  

## 2010-08-27 NOTE — Telephone Encounter (Signed)
Patient called to speak to Duwayne Heck this morning---said he has already spoken to her once about his "problem" (would not go into detail) and would need her to call him back as soon as possible--informed him that she was out in morning and should be back around lunch and that she would get this message after lunch--he said that would be fine

## 2010-08-27 NOTE — Telephone Encounter (Signed)
If he continued coughing, I prefer him to see pulmonary, Dr. Sherene Sires.  It is possible that the new BP medication is causing cough but that is actually not very common. Please arrange pulmonary referal

## 2010-08-27 NOTE — Telephone Encounter (Signed)
Dr.Paz please advise on pts concern.

## 2010-08-29 ENCOUNTER — Other Ambulatory Visit (INDEPENDENT_AMBULATORY_CARE_PROVIDER_SITE_OTHER): Payer: Medicare HMO

## 2010-08-29 ENCOUNTER — Institutional Professional Consult (permissible substitution): Payer: Medicare HMO | Admitting: Internal Medicine

## 2010-08-29 ENCOUNTER — Encounter: Payer: Self-pay | Admitting: Internal Medicine

## 2010-08-29 ENCOUNTER — Ambulatory Visit (INDEPENDENT_AMBULATORY_CARE_PROVIDER_SITE_OTHER): Payer: Medicare HMO | Admitting: Internal Medicine

## 2010-08-29 VITALS — BP 138/84 | HR 68 | Temp 97.9°F | Ht 70.0 in | Wt 227.2 lb

## 2010-08-29 DIAGNOSIS — R059 Cough, unspecified: Secondary | ICD-10-CM

## 2010-08-29 DIAGNOSIS — R05 Cough: Secondary | ICD-10-CM | POA: Insufficient documentation

## 2010-08-29 DIAGNOSIS — I10 Essential (primary) hypertension: Secondary | ICD-10-CM

## 2010-08-29 LAB — CBC WITH DIFFERENTIAL/PLATELET
Basophils Absolute: 0.1 10*3/uL (ref 0.0–0.1)
HCT: 43.4 % (ref 39.0–52.0)
Hemoglobin: 14.6 g/dL (ref 13.0–17.0)
Lymphs Abs: 2.4 10*3/uL (ref 0.7–4.0)
Monocytes Relative: 11.5 % (ref 3.0–12.0)
Neutro Abs: 3.8 10*3/uL (ref 1.4–7.7)
RDW: 13.8 % (ref 11.5–14.6)

## 2010-08-29 MED ORDER — PREDNISONE (PAK) 10 MG PO TABS: ORAL_TABLET | ORAL | Status: AC

## 2010-08-29 NOTE — Patient Instructions (Addendum)
Stop amlodipine and losartan Start azor 5/20 one daily Prednisone 10 mg take  4 each am x 2 days,   2 each am x 2 days,  1 each am x2days and stop  Try prilosec 20mg   Take 30-60 min before first meal of the day and Pepcid 20 mg one bedtime until cough is completely gone for at least a week without the need for cough suppression  I think of reflux for chronic cough like I do oxygen for fire (doesn't cause the fire but once you get the oxygen suppressed it usually goes away regardless of the exact cause).   GERD (REFLUX)  is an extremely common cause of respiratory symptoms, many times with no significant heartburn at all.    It can be treated with medication, but also with lifestyle changes including avoidance of late meals, excessive alcohol, smoking cessation, and avoid fatty foods, chocolate, peppermint, colas, red wine, and acidic juices such as orange juice.  NO MINT OR MENTHOL PRODUCTS SO NO COUGH DROPS  USE SUGARLESS CANDY INSTEAD (jolley ranchers or Stover's)  NO OIL BASED VITAMINS  Please see patient coordinator before you leave today  to schedule sinus ct   Please schedule a follow up office visit in 2 weeks, sooner if needed

## 2010-08-29 NOTE — Progress Notes (Signed)
Subjective:     Patient ID: Jeffery Cline, male   DOB: 1945/03/01, 65 y.o.   MRN: [id number]  HPI  43 yowm never smoker with asthma as child with passive smoke exp only outgrew while still in elementary school and athletic in high school with occ rhinitis esp  in summer with grass exposure (stuffy nose/sneezing)  and sev episodes of bad bronchitis requiring ov every several years treated with advair ? Helped but never needed any maintenance   08/29/2010 Initial pulmonary office eval in EMR era cc  Acute onset severe cough p  Weekend exposure to wet conditions early May 2012  with re-enactment civil war and coughing daily ever since, some better on abx including zpak   And prednisone  Never  Tried inhalgers though. Cough worse in am and hs.  Minimal mucoid sputum now.    Pt denies any significant  Overt hb/  dysphagia, itching,  nasal congestion or excess/ purulent secretions,  fever, chills, sweats, unintended wt loss, pleuritic or exertional cp, hempoptysis, orthopnea pnd or leg swelling.    Also denies any obvious fluctuation of symptoms with weather or environmental changes or other aggravating or alleviating factors.       Review of Systems  Constitutional: Negative for fever, chills, activity change, appetite change and unexpected weight change.  HENT: Positive for congestion, sore throat and sneezing. Negative for rhinorrhea, trouble swallowing, dental problem, voice change and postnasal drip.   Eyes: Negative for visual disturbance.  Respiratory: Positive for cough. Negative for choking and shortness of breath.   Cardiovascular: Negative for chest pain and leg swelling.  Gastrointestinal: Negative for nausea, vomiting and abdominal pain.  Genitourinary: Negative for difficulty urinating.  Musculoskeletal: Negative for arthralgias.  Skin: Negative for rash.  Psychiatric/Behavioral: Negative for behavioral problems and confusion.       Objective:   Physical Exam Mod obese amb  wm nad Wt 227 08/29/2010  HEENT: nl dentition, turbinates, and orophanx. Nl external ear canals without cough reflex   NECK :  without JVD/Nodes/TM/ nl carotid upstrokes bilaterally   LUNGS: no acc muscle use, clear to A and P bilaterally without cough on insp or exp maneuvers   CV:  RRR  no s3 or murmur or increase in P2, no edema   ABD:  soft and nontender with nl excursion in the supine position. No bruits or organomegaly, bowel sounds nl  MS:  warm without deformities, calf tenderness, cyanosis or clubbing  SKIN: warm and dry without lesions    NEURO:  alert, approp, no deficits  cxr 08/21/10 wnl     Assessment:         Plan:

## 2010-08-30 NOTE — Assessment & Plan Note (Signed)
The most common causes of chronic cough in immunocompetent adults include the following: upper airway cough syndrome (UACS), previously referred to as postnasal drip syndrome (PNDS), which is caused by variety of rhinosinus conditions; (2) asthma; (3) GERD; (4) chronic bronchitis from cigarette smoking or other inhaled environmental irritants; (5) nonasthmatic eosinophilic bronchitis; and (6) bronchiectasis.   These conditions, singly or in combination, have accounted for up to 94% of the causes of chronic cough in prospective studies.   Other conditions have constituted no >6% of the causes in prospective studies These have included bronchogenic carcinoma, chronic interstitial pneumonia, sarcoidosis, left ventricular failure, ACEI-induced cough, and aspiration from a condition associated with pharyngeal dysfunction.  This is almost certainly  Classic Upper airway cough syndrome, so named because it's frequently impossible to sort out how much is  CR/sinusitis with freq throat clearing (which can be related to primary GERD)   vs  causing  secondary (" extra esophageal")  GERD from wide swings in gastric pressure that occur with throat clearing, often  promoting self use of mint and menthol lozenges that reduce the lower esophageal sphincter tone and exacerbate the problem further in a cyclical fashion.   These are the same pts who not infrequently have failed to tolerate ace inhibitors,  dry powder inhalers or biphosphonates or report having reflux symptoms that don't respond to standard doses of PPI , and are easily confused as having aecopd or asthma flares,   For now check sinus ct and rx empirically for gerd then regroup  Discussed with pt in detail: The standardized cough guidelines recently published in Chest by [place] in 2006  are a multiple step process (up to 12!) , not a single office visit,  and are intended  to address this problem logically,  with an alogrithm dependent on response  to empiric treatment at  each progressive step  to determine a specific diagnosis with  minimal addtional testing needed. Therefore if compliance is an issue or can't be accurately verified then it's very unlikely the standard evaluation and treatment will be successful here.    Furthermore, response to therapy (other than acute cough suppression, which should only be used short term with avoidance of narcotic containing cough syrups if possible), can be a gradual process for which the patient may not receive immediate benefit.  Unlike going to an eye doctor where the right rx is almost always the first one and is immediately effective, this is almost never the case in the management of chronic cough syndromes and the patient needs to commit up front to compliance with recommendations and have the patience to wait out a response for up to 6 weeks of therapy directed at the likely underlying problem(s).

## 2010-08-30 NOTE — Assessment & Plan Note (Signed)
Doubt this is ARB but will give trial off at pt's request and use diovan/amlodipine in combo instead

## 2010-09-01 ENCOUNTER — Ambulatory Visit (INDEPENDENT_AMBULATORY_CARE_PROVIDER_SITE_OTHER)
Admission: RE | Admit: 2010-09-01 | Discharge: 2010-09-01 | Disposition: A | Discharge status: Home / Self Care | Payer: Medicare HMO | Source: Ambulatory Visit | Attending: Internal Medicine | Admitting: Internal Medicine

## 2010-09-01 DIAGNOSIS — R059 Cough, unspecified: Secondary | ICD-10-CM

## 2010-09-01 DIAGNOSIS — R05 Cough: Secondary | ICD-10-CM

## 2010-09-01 LAB — ALLERGY PROFILE REGION II-DC, DE, MD, ~~LOC~~, VA
Aspergillus fumigatus, IgG: <0.1 kU/L (ref <0.35)
Box Elder IgE: <0.1 kU/L (ref <0.35)
Cladosporium Herbarum: <0.1 kU/L (ref <0.35)
Lamb's Quarters: <0.1 kU/L (ref <0.35)
Meadow Grass: <0.1 kU/L (ref <0.35)
Oak: <0.1 kU/L (ref <0.35)

## 2010-09-01 NOTE — Progress Notes (Signed)
Quick Note:  Spoke with pt and notified of results per Dr. Wert. Pt verbalized understanding and denied any questions.  ______ 

## 2010-09-15 ENCOUNTER — Institutional Professional Consult (permissible substitution): Payer: Medicare HMO | Admitting: Internal Medicine

## 2010-09-17 ENCOUNTER — Encounter: Payer: Self-pay | Admitting: Internal Medicine

## 2010-09-17 ENCOUNTER — Ambulatory Visit (INDEPENDENT_AMBULATORY_CARE_PROVIDER_SITE_OTHER): Payer: Medicare HMO | Admitting: Internal Medicine

## 2010-09-17 VITALS — BP 116/68 | HR 58 | Temp 97.9°F | Ht 70.0 in | Wt 231.6 lb

## 2010-09-17 DIAGNOSIS — R05 Cough: Secondary | ICD-10-CM

## 2010-09-17 DIAGNOSIS — I10 Essential (primary) hypertension: Secondary | ICD-10-CM

## 2010-09-17 DIAGNOSIS — R059 Cough, unspecified: Secondary | ICD-10-CM

## 2010-09-17 NOTE — Progress Notes (Signed)
Subjective:     Patient ID: Jeffery Cline, male   DOB: 18-Dec-1945, 65 y.o.   MRN: [id number]  HPI  44 yowm never smoker with asthma as child with passive smoke exp only outgrew while still in elementary school and athletic in high school with occ rhinitis esp  in summer with grass exposure (stuffy nose/sneezing)  and sev episodes of bad bronchitis requiring ov every several years treated with advair ? Helped but never needed any maintenance   08/29/2010 Initial pulmonary office eval in EMR era cc  Acute onset severe cough p  Weekend exposure to wet conditions early May 2012  with re-enactment civil war and coughing daily ever since, some better on abx including zpak   And prednisone  Never  Tried inhalers though. Cough worse in am and hs.  Minimal mucoid sputum now.   rec Stop amlodipine and losartan Start azor 5/20 one daily Prednisone 10 mg take  4 each am x 2 days,   2 each am x 2 days,  1 each am x2days and stop  Try prilosec 20mg   Take 30-60 min before first meal of the day and Pepcid 20 mg one bedtime until cough is completely gone for at least a week without the need for cough suppression GERD (REFLUX) diet  09/17/2010 f/u ov/[facility] cc cough 80% better, not so much p prednisone,  Cough day > night, esp on phone, did not follow recs re use of gerd suppression (stopped p 2 weeks even though not completely better.    Sleeping ok without nocturnal  or early am exac of resp c/o's  - Pt denies any significant sore throat, dysphagia, itching, sneezing,  nasal congestion or excess/ purulent secretions,  fever, chills, sweats, unintended wt loss, pleuritic or exertional cp, hempoptysis, orthopnea pnd or leg swelling.    Also denies any obvious fluctuation of symptoms with weather or environmental changes or other aggravating or alleviating factors.     .             Objective:   Physical Exam  Mod obese amb wm nad  Wt 227 08/29/2010  > 09/17/2010  231  HEENT: nl dentition, turbinates,  and orophanx. Nl external ear canals without cough reflex   NECK :  without JVD/Nodes/TM/ nl carotid upstrokes bilaterally   LUNGS: no acc muscle use, clear to A and P bilaterally without cough on insp or exp maneuvers   CV:  RRR  no s3 or murmur or increase in P2, no edema   ABD:  soft and nontender with nl excursion in the supine position. No bruits or organomegaly, bowel sounds nl  MS:  warm without deformities, calf tenderness, cyanosis or clubbing     cxr 08/21/10 wnl     Assessment:         Plan:

## 2010-09-17 NOTE — Assessment & Plan Note (Signed)
Ok on azor 5/20 so continue for a month to see if cough gone, then resume losartan ok with me

## 2010-09-17 NOTE — Patient Instructions (Signed)
Stay on free samples of azor for another month while on prilosec 20mg   Take 30-60 min before first meal of the day and Pepcid 20 mg one bedtime for a minimum of two  months and if better after one month  ok to resume amlodipine and losartan.  Then if cough comes back you know it's the medication.  Best medicine available for drainage chlortrimeton 4 mg every 6 hours if needed  Please schedule a follow up visit in 2  months but call sooner if needed .  GERD (REFLUX)  is an extremely common cause of respiratory symptoms, many times with no significant heartburn at all.    It can be treated with medication, but also with lifestyle changes including avoidance of late meals, excessive alcohol, smoking cessation, and avoid fatty foods, chocolate, peppermint, colas, red wine, and acidic juices such as orange juice.  NO MINT OR MENTHOL PRODUCTS SO NO COUGH DROPS  USE SUGARLESS CANDY INSTEAD (jolley ranchers or Stover's)  NO OIL BASED VITAMINS

## 2010-09-17 NOTE — Assessment & Plan Note (Signed)
Most c/w  Classic Upper airway cough syndrome, so named because it's frequently impossible to sort out how much is  CR/sinusitis with freq throat clearing (which can be related to primary GERD)   vs  causing  secondary (" extra esophageal")  GERD from wide swings in gastric pressure that occur with throat clearing, often  promoting self use of mint and menthol lozenges that reduce the lower esophageal sphincter tone and exacerbate the problem further in a cyclical fashion.   These are the same pts who not infrequently have failed to tolerate ace inhibitors,  dry powder inhalers or biphosphonates or report having reflux symptoms that don't respond to standard doses of PPI , and are easily confused as having aecopd or asthma flares,   Since so much better guidelines suggest max gerd rx x minimum of 6 weeks then regroup

## 2010-09-22 ENCOUNTER — Other Ambulatory Visit: Payer: Self-pay | Admitting: Internal Medicine

## 2010-10-20 ENCOUNTER — Other Ambulatory Visit: Payer: Self-pay | Admitting: Internal Medicine

## 2010-10-21 MED ORDER — FENOFIBRATE 145 MG PO TABS: 145.0000 mg | ORAL_TABLET | Freq: Every day | ORAL | Status: DC

## 2010-10-21 MED ORDER — AMLODIPINE-OLMESARTAN 5-20 MG PO TABS: 1.0000 | ORAL_TABLET | Freq: Every day | ORAL | Status: DC

## 2010-10-21 MED ORDER — METOPROLOL TARTRATE 50 MG PO TABS: 50.0000 mg | ORAL_TABLET | Freq: Two times a day (BID) | ORAL | Status: DC

## 2010-10-21 NOTE — Telephone Encounter (Signed)
Rx[s] Done. 

## 2010-10-23 ENCOUNTER — Other Ambulatory Visit: Payer: Self-pay | Admitting: Internal Medicine

## 2010-10-24 NOTE — Telephone Encounter (Signed)
Refill was done on 10/21/10. Checked with pharmacy to ensure that it was received

## 2010-10-29 ENCOUNTER — Other Ambulatory Visit: Payer: Self-pay | Admitting: Internal Medicine

## 2010-10-29 MED ORDER — AMLODIPINE-OLMESARTAN 5-20 MG PO TABS: 1.0000 | ORAL_TABLET | Freq: Every day | ORAL | Status: DC

## 2010-10-29 NOTE — Telephone Encounter (Signed)
Sent med to pharmacy.Marland KitchenMarland Kitchen9/19/12@11 ;48am/LMB

## 2010-11-06 ENCOUNTER — Other Ambulatory Visit: Payer: Self-pay | Admitting: Internal Medicine

## 2010-11-06 NOTE — Telephone Encounter (Signed)
Amlodipine Besylate 5 mg request. This is the medication that is in patient's EMR chart [in [facility]].  Pulmonary office visit on 09/17/2010 that made these changes: Stop amlodipine and losartan  Start azor 5/20 one daily  Prednisone 10 mg take 4 each am x 2 days, 2 each am x 2 days, 1 each am x2days and stop  Try prilosec 20mg  Take 30-60 min before first meal of the day and Pepcid 20 mg one bedtime until cough is completely gone for at least a week without the need for cough suppression  GERD (REFLUX) diet  Azor filled last week on 10/29/2010.? Please Advise.

## 2010-11-06 NOTE — Telephone Encounter (Signed)
Need to clarify what meds he is on, [facility]

## 2010-11-14 ENCOUNTER — Encounter (INDEPENDENT_AMBULATORY_CARE_PROVIDER_SITE_OTHER): Payer: Self-pay | Admitting: Surgery

## 2010-11-17 ENCOUNTER — Other Ambulatory Visit: Payer: Self-pay | Admitting: Internal Medicine

## 2010-11-17 MED ORDER — AMLODIPINE BESYLATE 5 MG PO TABS: 5.0000 mg | ORAL_TABLET | Freq: Every day | ORAL | Status: DC

## 2010-11-17 NOTE — Telephone Encounter (Signed)
Spoke with patient, cough not better or worse with amlodipine/losartan or Azore. Patient prefers amlodipine and losartan. Medication list changed, refill amlodipine. He will see pulmonary, the patient is convinced that cough is  GERD related. After the pulmonary visit he will come back and see me for a physical.

## 2010-11-17 NOTE — Telephone Encounter (Signed)
Duplicate Done 11/17/10.

## 2010-11-18 ENCOUNTER — Encounter (INDEPENDENT_AMBULATORY_CARE_PROVIDER_SITE_OTHER): Payer: Self-pay | Admitting: Surgery

## 2010-11-18 ENCOUNTER — Ambulatory Visit (INDEPENDENT_AMBULATORY_CARE_PROVIDER_SITE_OTHER): Payer: Medicare HMO | Admitting: Surgery

## 2010-11-18 VITALS — BP 134/88 | HR 64 | Temp 97.6°F | Resp 20 | Ht 70.0 in | Wt 234.0 lb

## 2010-11-18 DIAGNOSIS — L723 Sebaceous cyst: Secondary | ICD-10-CM

## 2010-11-18 NOTE — Progress Notes (Signed)
Chief Complaint  Patient presents with  . Other    est pt- eval another cyst on back    HPI Jeffery Cline is a 65 y.o. male. This patient has had multiple sebaceous cyst excised from his back in the past. He presents with at least 3 new areas that are slowly enlarging. There is one on his posterior left shoulder that had been quite tender and swollen but has improved slightly. He denies any drainage from any of the cyst. He presents now to plan excision here in the office. HPI  Past Medical History  Diagnosis Date  . Hyperlipidemia   . Hypertension   . Cyst     Past Surgical History  Procedure Date  . Pilonidal cyst excision   . Inguinal hernia repair     right by dr. Magnus Ivan  . Cystectomy     back    Family History  Problem Relation Age of Onset  . Dementia Mother   . Heart disease Father     Social History History  Substance Use Topics  . Smoking status: Never Smoker   . Smokeless tobacco: Never Used  . Alcohol Use: Yes    No Known Allergies  Current Outpatient Prescriptions  Medication Sig Dispense Refill  . amLODipine (NORVASC) 5 MG tablet Take 1 tablet (5 mg total) by mouth daily.  90 tablet  3  . aspirin 81 MG tablet Take 81 mg by mouth daily.        . fenofibrate (TRICOR) 145 MG tablet Take 1 tablet (145 mg total) by mouth daily.  30 tablet  3  . losartan (COZAAR) 100 MG tablet TAKE 1 TABLET DAILY  30 tablet  3  . metoprolol (LOPRESSOR) 50 MG tablet Take 1 tablet (50 mg total) by mouth 2 (two) times daily.  60 tablet  3    Review of Systems Review of Systems ROS otherwise negative  Blood pressure 134/88, pulse 64, temperature 97.6 F (36.4 C), temperature source Temporal, resp. rate 20, height 5\' 10"  (1.778 m), weight 234 lb (106.142 kg).  Physical Exam Physical Exam WDWN in NAD Skin - posterior left scapula - two 1.5/ 2.0 cm cysts; no inflammation or infection Right lumbar - 3 cm mass - lipoma vs sebaceous cyst Data  Reviewed None  Assessment    Sebaceous cysts - 1.5/ 2.0/ 3.0 cm of the posterior left shoulder and right lumbar regions    Plan    Excision in the office under local anesthesia.       Bertil Brickey K. 11/18/2010, 9:29 AM

## 2010-11-24 ENCOUNTER — Ambulatory Visit (INDEPENDENT_AMBULATORY_CARE_PROVIDER_SITE_OTHER): Payer: Medicare HMO | Admitting: Internal Medicine

## 2010-11-24 ENCOUNTER — Encounter: Payer: Self-pay | Admitting: Internal Medicine

## 2010-11-24 VITALS — BP 128/78 | HR 57 | Temp 98.2°F | Ht 70.0 in | Wt 241.0 lb

## 2010-11-24 DIAGNOSIS — R059 Cough, unspecified: Secondary | ICD-10-CM

## 2010-11-24 DIAGNOSIS — R05 Cough: Secondary | ICD-10-CM

## 2010-11-24 NOTE — Assessment & Plan Note (Signed)
Response to present rx strongly supports  Classic Upper airway cough syndrome, so named because it's frequently impossible to sort out how much is  CR/sinusitis with freq throat clearing (which can be related to primary GERD)   vs  causing  secondary (" extra esophageal")  GERD from wide swings in gastric pressure that occur with throat clearing, often  promoting self use of mint and menthol lozenges that reduce the lower esophageal sphincter tone and exacerbate the problem further in a cyclical fashion.   These are the same pts who not infrequently have failed to tolerate ace inhibitors,  dry powder inhalers or biphosphonates or report having reflux symptoms that don't respond to standard doses of PPI , and are easily confused as having aecopd or asthma flares,   See instructions for specific recommendations which were reviewed directly with the patient who was given a copy with highlighter outlining the key components.

## 2010-11-24 NOTE — Patient Instructions (Signed)
First try off the pepcid and use nasal saline sprays to control drainage/ nasal obstruction  Lose extra weight and watch your diet and then after another drop the pepcid and see if cough flares  At any point the cough really flares (for any reason): treat yourself with maxium dose acid suppression :  prilosec Take 30- 60 min before your first and last meals of the day and the pepcid 20 mg one at bedtime for at least 2 weeks after better and use Chlortrimeton to suppress nasal drainage (over the counter)    If you are satisfied with your treatment plan let your doctor know and he/she can either refill your medications or you can return here when your prescription runs out.     If in any way you are not 100% satisfied,  please tell us.  If 100% better, tell your friends!

## 2010-11-24 NOTE — Progress Notes (Signed)
Subjective:     Patient ID: Jeffery Cline, male   DOB: Jul 20, 1945, 65 y.o.   MRN: [id number]  HPI  4 yowm never smoker with asthma as child with passive smoke exp only outgrew while still in elementary school and athletic in high school with occ rhinitis esp  in summer with grass exposure (stuffy nose/sneezing)  and sev episodes of bad bronchitis requiring ov every several years treated with advair ? Helped but never needed any maintenance   08/29/2010 Initial pulmonary office eval in EMR era cc  Acute onset severe cough p  Weekend exposure to wet conditions early May 2012  with re-enactment civil war and coughing daily ever since, some better on abx including zpak   And prednisone  Never  Tried inhalers though. Cough worse in am and hs.  Minimal mucoid sputum now.   rec Stop amlodipine and losartan Start azor 5/20 one daily Prednisone 10 mg take  4 each am x 2 days,   2 each am x 2 days,  1 each am x2days and stop  Try prilosec 20mg   Take 30-60 min before first meal of the day and Pepcid 20 mg one bedtime until cough is completely gone for at least a week without the need for cough suppression GERD (REFLUX) diet  09/17/2010 f/u ov/Jeffery Cline cc cough 80% better, not so much p prednisone,  Cough day > night, esp on phone, did not follow recs re use of gerd suppression (stopped p 2 weeks even though not completely better)  rec Stay on free samples of azor for another month while on prilosec 20mg   Take 30-60 min before first meal of the day and Pepcid 20 mg one bedtime for a minimum of two  months and if better after one month  ok to resume amlodipine and losartan.  Then if cough comes back you know it's the medication. Best medicine available for drainage chlortrimeton 4 mg every 6 hours if needed GERD diet reviewed again     11/24/2010 f/u ov/Jeffery Cline back on cozar due to insurance,  cc 100% better cough when it occurs is rare, dry, and  Seems to be triggered by wine, almonds,  Clementeen Graham taking prilosec  each am and sometimes the pepcid at bedtime but has not tried H1   Sleeping ok without nocturnal  or early am exac of resp c/o's  - Pt denies any significant sore throat, dysphagia, itching, sneezing,  nasal congestion or excess/ purulent secretions,  fever, chills, sweats, unintended wt loss, pleuritic or exertional cp, hempoptysis, orthopnea pnd or leg swelling.    Also denies any obvious fluctuation of symptoms with weather or environmental changes or other aggravating or alleviating factors.     .             Objective:   Physical Exam  Mod obese amb wm nad  Wt 227 08/29/2010  > 09/17/2010  231  > 11/24/2010  241  HEENT: nl dentition and orophanx. Nl external ear canals without cough reflex- moderate bilateral nasal turbinate swelling with watery discharge, no cyanosis or polyps  NECK :  without JVD/Nodes/TM/ nl carotid upstrokes bilaterally   LUNGS: no acc muscle use, clear to A and P bilaterally without cough on insp or exp maneuvers   CV:  RRR  no s3 or murmur or increase in P2, no edema   ABD:  soft and nontender with nl excursion in the supine position. No bruits or organomegaly, bowel sounds nl  MS:  warm without  deformities, calf tenderness, cyanosis or clubbing     cxr 08/25/10 Minimal atelectasis versus scarring at the right lung base. No  overt infiltrate identified.     Assessment:         Plan:

## 2010-12-10 ENCOUNTER — Ambulatory Visit (INDEPENDENT_AMBULATORY_CARE_PROVIDER_SITE_OTHER): Payer: Medicare HMO | Admitting: Surgery

## 2010-12-10 ENCOUNTER — Encounter (INDEPENDENT_AMBULATORY_CARE_PROVIDER_SITE_OTHER): Payer: Self-pay | Admitting: Surgery

## 2010-12-10 VITALS — BP 132/84 | HR 60 | Temp 97.2°F | Resp 20 | Ht 70.0 in | Wt 235.4 lb

## 2010-12-10 DIAGNOSIS — L723 Sebaceous cyst: Secondary | ICD-10-CM

## 2010-12-10 NOTE — Progress Notes (Signed)
Procedure cancelled due to fire alarm.  Rescheduled for tomorrow.

## 2010-12-10 NOTE — Patient Instructions (Signed)
We will perform the procedure tomorrow.

## 2010-12-11 ENCOUNTER — Encounter (INDEPENDENT_AMBULATORY_CARE_PROVIDER_SITE_OTHER): Payer: Self-pay | Admitting: Surgery

## 2010-12-11 ENCOUNTER — Ambulatory Visit (INDEPENDENT_AMBULATORY_CARE_PROVIDER_SITE_OTHER): Payer: Medicare HMO | Admitting: Surgery

## 2010-12-11 VITALS — BP 124/78 | HR 64 | Temp 97.5°F | Resp 20 | Ht 70.0 in | Wt 235.2 lb

## 2010-12-11 DIAGNOSIS — L723 Sebaceous cyst: Secondary | ICD-10-CM

## 2010-12-11 NOTE — Patient Instructions (Signed)
Remove dressings on Saturday, then you may shower over the steri-strips.

## 2010-12-11 NOTE — Progress Notes (Signed)
Removal of three sebaceous cysts of the left posterior shoulder and right lower back - local anesthetic.  Closed with 4-0 Monocryl, steri-strips, Tegaderm.    May shower in 48 hours.  Follow-up PRn.

## 2011-01-19 ENCOUNTER — Other Ambulatory Visit: Payer: Self-pay | Admitting: Internal Medicine

## 2011-01-19 ENCOUNTER — Encounter: Payer: Self-pay | Admitting: Internal Medicine

## 2011-01-20 ENCOUNTER — Ambulatory Visit (INDEPENDENT_AMBULATORY_CARE_PROVIDER_SITE_OTHER): Payer: Medicare HMO | Admitting: Internal Medicine

## 2011-01-20 DIAGNOSIS — R059 Cough, unspecified: Secondary | ICD-10-CM

## 2011-01-20 DIAGNOSIS — R05 Cough: Secondary | ICD-10-CM

## 2011-01-20 DIAGNOSIS — Z125 Encounter for screening for malignant neoplasm of prostate: Secondary | ICD-10-CM

## 2011-01-20 DIAGNOSIS — I10 Essential (primary) hypertension: Secondary | ICD-10-CM

## 2011-01-20 DIAGNOSIS — Z0000 Encounter for general adult medical examination without abnormal findings: Secondary | ICD-10-CM | POA: Insufficient documentation

## 2011-01-20 DIAGNOSIS — N529 Male erectile dysfunction, unspecified: Secondary | ICD-10-CM

## 2011-01-20 DIAGNOSIS — Z23 Encounter for immunization: Secondary | ICD-10-CM

## 2011-01-20 DIAGNOSIS — E785 Hyperlipidemia, unspecified: Secondary | ICD-10-CM

## 2011-01-20 LAB — AST: AST: 25 U/L (ref 0–37)

## 2011-01-20 LAB — LIPID PANEL
Cholesterol: 189 mg/dL (ref 0–200)
Triglycerides: 172 mg/dL — ABNORMAL HIGH (ref 0.0–149.0)

## 2011-01-20 LAB — ALT: ALT: 36 U/L (ref 0–53)

## 2011-01-20 LAB — BASIC METABOLIC PANEL
CO2: 26 mEq/L (ref 19–32)
Chloride: 106 mEq/L (ref 96–112)
Potassium: 3.9 mEq/L (ref 3.5–5.1)

## 2011-01-20 LAB — PSA: PSA: 1.35 ng/mL (ref 0.10–4.00)

## 2011-01-20 MED ORDER — NEBIVOLOL HCL 20 MG PO TABS: 20.0000 mg | ORAL_TABLET | Freq: Every day | ORAL | Status: DC

## 2011-01-20 NOTE — Assessment & Plan Note (Addendum)
amb BP great, will change metoprolol to bystolic d/t issues w/ ED (see ED)

## 2011-01-20 NOTE — Assessment & Plan Note (Addendum)
Definitely worse when changed metoprolol from ER to plain Plan: Change metoprolol to bystolic, See instructions Also consider go back to metoprolol er

## 2011-01-20 NOTE — Assessment & Plan Note (Addendum)
Td 09-2008, pneumonia today Zostavax 02-2008 Had a flu shot already  Colonoscopy:   Date:  01/09/2002, normal Next cscope due 01/2012  However has occ "mucus " from the rectum and a trace + hemocult: refer for another Cscope     continue with his healthy lifestyle, discussed diet

## 2011-01-20 NOTE — Patient Instructions (Signed)
Stop metoprolol , start bystolic 10 mg 2 tablets once a day Watch your BP (goal 110/60 to 140/85) Call if side effects Depending on results we may need to increase or decrease the dose Came back in 6 weeks

## 2011-01-20 NOTE — Progress Notes (Signed)
  Subjective:    Patient ID: Jeffery Cline, male    DOB: Dec 06, 1945, 65 y.o.   MRN: [id number]  HPI Here for Medicare AWV: 1. Risk factors based on Past M, S, F history: reviewed 2. Physical Activities: walks 2 miles x 4 /week   3. Depression/mood:  No problems noted, reported  4. Hearing: no problems noted or reported    5. ADL's: totally independent    6. Fall Risk: no recent falls, low risk 7. home Safety: does feelsafe at home  8. Height, weight, &visual acuity: see VS, normal vision, uses reading glasses   9. Counseling: provided 10. Labs ordered based on risk factors: if needed  11. Referral Coordination: if needed 12.  Care Plan, see assessment and plan  13.   Cognitive Assessment: Motor exam and cognition are normal  In addition, today we discussed the following: HTN-- good med compliance , amb BP wnl Hyperlipidemia--  good medication compliance Cough-- occ cough, well treated w/ PPIs prn ED-- worse since metoprolol was changed from "ER qd"  to plain bid ; some decreased libido, no decrease muscle mass   Past Medical History: Hyperlipidemia Hypertension ED decreased L carotid pulse (?) : Nl carotidu/s 02-2006 Sebaceous cyst in the back on-off   Past Surgical History: SLEEP STUDY (2003): neg Pilonidal cyst Inguinal herniorrhaphy  (L)   Family History: M - deceased age 34 pneumonia/dementia F - deceased age 51 coronary insuff CAD - GF, F dx in late 70s  Valve replacement (due to Ca++) -- F age 70s  DM - no colon ca - no prostate ca - no  Social History: Married. 2 kids ([place] and [place]) 5 grandkids retired  Alcohol use-yes  (occ beer) Never Smoked Drug use-no Regular exercise-yes (walks every  day )  Review of Systems  Constitutional: Negative for fever and fatigue.  Respiratory: Negative for shortness of breath and wheezing.   Cardiovascular: Negative for chest pain. Leg swelling: mild LE edema   Gastrointestinal: Negative for abdominal pain and  blood in stool.       Occ mucus per rectum after a BM, no blood-itching-pain.Depends on what food he eats  Genitourinary: Negative for dysuria, hematuria and difficulty urinating.       Objective:   Physical Exam  Constitutional: He is oriented to person, place, and time. He appears well-developed and well-nourished. No distress.  HENT:  Head: Atraumatic.  Neck: No thyromegaly present.  Cardiovascular: Normal rate, regular rhythm and normal heart sounds.   No murmur heard. Pulmonary/Chest: Effort normal and breath sounds normal. No respiratory distress. He has no wheezes. He has no rales.  Abdominal: Soft. Bowel sounds are normal. He exhibits no distension. There is no tenderness. There is no rebound and no guarding.  Genitourinary: Rectum normal and prostate normal. Guaiac stool: brown stools, trace + hemocult.       No exeternal hemorrhoids   Musculoskeletal: He exhibits no edema.  Neurological: He is alert and oriented to person, place, and time.  Skin: He is not diaphoretic.  Psychiatric: He has a normal mood and affect. His behavior is normal. Judgment and thought content normal.      Assessment & Plan:

## 2011-01-20 NOTE — Assessment & Plan Note (Signed)
Well controlled at present w/ PPIs prn thus likely related to GERD

## 2011-01-21 ENCOUNTER — Encounter: Payer: Self-pay | Admitting: Internal Medicine

## 2011-01-22 ENCOUNTER — Encounter: Payer: Self-pay | Admitting: Internal Medicine

## 2011-02-13 ENCOUNTER — Ambulatory Visit (INDEPENDENT_AMBULATORY_CARE_PROVIDER_SITE_OTHER): Payer: Medicare HMO | Admitting: Internal Medicine

## 2011-02-13 VITALS — BP 138/78 | HR 65 | Temp 97.6°F | Ht 69.75 in | Wt 233.0 lb

## 2011-02-13 DIAGNOSIS — J40 Bronchitis, not specified as acute or chronic: Secondary | ICD-10-CM

## 2011-02-13 MED ORDER — HYDROCODONE-HOMATROPINE 5-1.5 MG/5ML PO SYRP: 5.0000 mL | ORAL_SOLUTION | Freq: Four times a day (QID) | ORAL | Status: DC | PRN

## 2011-02-13 MED ORDER — AZITHROMYCIN 250 MG PO TABS: ORAL_TABLET | ORAL | Status: AC

## 2011-02-13 NOTE — Patient Instructions (Addendum)
Rest, fluids , tylenol For cough, take Mucinex DM twice a day as needed  If the cough continue, use hydrocodone, will cause drowsiness Take the antibiotic as prescribed ----> zpack if wheezing continue, call for an additional med (ventolin) qnsal 2 sprays on each side of the nose daily until sample gone Call if no better in few days Call anytime if the symptoms are severe

## 2011-02-13 NOTE — Progress Notes (Signed)
  Subjective:    Patient ID: Jeffery Cline, male    DOB: 1945/02/25, 66 y.o.   MRN: [id number]  HPI One-week history of cough, chest and sinus congestion with production of green and abundant sputum. Some wheezing.  Past Medical History:  Hyperlipidemia  Hypertension  ED  decreased L carotid pulse (?) : Nl carotidu/s 02-2006  Sebaceous cyst in the back on-off   Past Surgical History:  SLEEP STUDY (2003): neg  Pilonidal cyst  Inguinal herniorrhaphy (L)  Social History:  Married. 2 kids ([place] and [place])  5 grandkids  retired  Alcohol use-yes (occ beer)  Never Smoked  Drug use-no    Review of Systems No fever or chills No chest pain or shortness of breath No nausea, vomiting, diarrhea     Objective:   Physical Exam  Constitutional: He is oriented to person, place, and time. He appears well-developed and well-nourished. No distress.  HENT:  Head: Normocephalic and atraumatic.       Face symmetric, not tender to palpation, right ear with wax, left ear normal. Nose congested, throat without redness  Cardiovascular: Normal rate, regular rhythm and normal heart sounds.   No murmur heard. Pulmonary/Chest:       Rhonchi bilaterally, no crackles, very few end expiratory wheezes  Neurological: He is alert and oriented to person, place, and time.  Skin: He is not diaphoretic.  Psychiatric: He has a normal mood and affect. His behavior is normal. Judgment and thought content normal.       Assessment & Plan:  Presents with bronchitis and mild bronchospasm. See instructions. Also he has symbicort at home, will use as needed.

## 2011-02-15 ENCOUNTER — Encounter: Payer: Self-pay | Admitting: Internal Medicine

## 2011-02-19 ENCOUNTER — Ambulatory Visit (INDEPENDENT_AMBULATORY_CARE_PROVIDER_SITE_OTHER): Payer: Medicare HMO | Admitting: Internal Medicine

## 2011-02-19 ENCOUNTER — Encounter: Payer: Self-pay | Admitting: Internal Medicine

## 2011-02-19 VITALS — BP 140/82 | HR 64 | Ht 70.0 in | Wt 229.0 lb

## 2011-02-19 DIAGNOSIS — R195 Other fecal abnormalities: Secondary | ICD-10-CM

## 2011-02-19 MED ORDER — PEG-KCL-NACL-NASULF-NA ASC-C 100 G PO SOLR: 1.0000 | Freq: Once | ORAL | Status: DC

## 2011-02-19 NOTE — Progress Notes (Signed)
Subjective:    Patient ID: Jeffery Cline, male    DOB: September 03, 1945, 66 y.o.   MRN: [id number]  HPI This man presents for evaluation of heme-positive stool. He described some occasional leakage of mucus in his underwear to his primary care physician last month. A digital rectal exam showed a trace heme positive stool. Had a routine colonoscopy in December of 2003 that showed internal and external hemorrhoids. His GI review of systems is positive for occasional cough but has been attributed in part to reflux. It's not a very frequent problem and he will use omeprazole 20 mg as needed to help control this. Again the stomach current issue.  No Known Allergies Outpatient Prescriptions Prior to Visit  Medication Sig Dispense Refill  . amLODipine (NORVASC) 5 MG tablet Take 1 tablet (5 mg total) by mouth daily.  90 tablet  3  . aspirin 81 MG tablet Take 81 mg by mouth daily.        . fenofibrate (TRICOR) 145 MG tablet Take 1 tablet (145 mg total) by mouth daily.  30 tablet  3  . losartan (COZAAR) 100 MG tablet TAKE 1 TABLET DAILY  30 tablet  2  . Nebivolol HCl (BYSTOLIC) 20 MG TABS Take 1 tablet (20 mg total) by mouth daily.  30 tablet  6  . Omeprazole 20 MG TBEC Take 1 tablet by mouth as needed.        Marland Kitchen azithromycin (ZITHROMAX Z-PAK) 250 MG tablet As directed  6 each  0  . HYDROcodone-homatropine (HYCODAN) 5-1.5 MG/5ML syrup Take 5 mLs by mouth every 6 (six) hours as needed for cough.  120 mL  0   Past Medical History  Diagnosis Date  . Hyperlipidemia   . Hypertension   . Pilonidal cyst   . Allergic rhinitis   . Hemorrhoids    Past Surgical History  Procedure Date  . Pilonidal cyst excision   . Inguinal hernia repair     right by dr. Magnus Ivan  . Cystectomy     back  . Colonoscopy 01/09/2002    internal and external hemorrhoids  . Tonsillectomy    History   Social History  . Marital Status: Married    Spouse Name: N/A    Number of Children: N/A  . Years of Education: N/A    Social History Main Topics  . Smoking status: Never Smoker   . Smokeless tobacco: Never Used  . Alcohol Use: Yes     less than 1 drink per day  . Drug Use: No    Social History Narrative  .  retired Engineer, site   Family History  Problem Relation Age of Onset  . Dementia Mother   . Coronary artery disease Father   . Coronary artery disease Maternal Grandfather   . Leukemia Paternal Grandfather   . Alcohol abuse Mother   . Colon cancer Neg Hx         Review of Systems Recovering from a URI, recently finished a Z-Pak.     Objective:   Physical Exam General:  Well-developed, well-nourished and in no acute distress Eyes:  anicteric. ENT:   Mouth and posterior pharynx free of lesions.  Neck:   supple w/o thyromegaly or mass.  Lungs: Clear to auscultation bilaterally. Heart:  S1S2, no rubs, murmurs, gallops. Abdomen:  soft, non-tender, no hepatosplenomegaly, hernia, or mass and BS+.  Rectal: Deferred until colonoscopy Lymph:  no cervical or supraclavicular adenopathy. Psych:  appropriate mood and  Affect.  Data Reviewed: Recent primary care office note Lab Results  Component Value Date   WBC 7.6 08/29/2010   HGB 14.6 08/29/2010   HCT 43.4 08/29/2010   MCV 93.2 08/29/2010   PLT 203.0 08/29/2010          Assessment & Plan:   1. Heme + stool    Evaluate for cause with colonoscopy. Risks benefits and indications were explained he understands and agrees to proceed.

## 2011-02-19 NOTE — Patient Instructions (Signed)
You have been scheduled for a Colonoscopy with separate instructions given. Your prep kit has been sent to your pharmacy for you to pick up, please pick this up within 3-4 days or in may be put back on the shelf.

## 2011-02-20 ENCOUNTER — Other Ambulatory Visit: Payer: Self-pay | Admitting: Internal Medicine

## 2011-04-09 ENCOUNTER — Encounter: Payer: Self-pay | Admitting: Internal Medicine

## 2011-04-14 ENCOUNTER — Ambulatory Visit (AMBULATORY_SURGERY_CENTER): Payer: Medicare HMO | Admitting: Internal Medicine

## 2011-04-14 ENCOUNTER — Encounter: Payer: Self-pay | Admitting: Internal Medicine

## 2011-04-14 VITALS — BP 136/89 | HR 54 | Temp 97.2°F | Resp 16 | Ht 70.0 in | Wt 229.0 lb

## 2011-04-14 DIAGNOSIS — D126 Benign neoplasm of colon, unspecified: Secondary | ICD-10-CM

## 2011-04-14 DIAGNOSIS — K5730 Diverticulosis of large intestine without perforation or abscess without bleeding: Secondary | ICD-10-CM

## 2011-04-14 DIAGNOSIS — R195 Other fecal abnormalities: Secondary | ICD-10-CM

## 2011-04-14 DIAGNOSIS — K648 Other hemorrhoids: Secondary | ICD-10-CM

## 2011-04-14 MED ORDER — SODIUM CHLORIDE 0.9 % IV SOLN: 500.0000 mL | INTRAVENOUS | Status: DC

## 2011-04-14 NOTE — Progress Notes (Signed)
Patient did not experience any of the following events: a burn prior to discharge; a fall within the facility; wrong site/side/patient/procedure/implant event; or a hospital transfer or hospital admission upon discharge from the facility. (G8907) Patient did not have preoperative order for IV antibiotic SSI prophylaxis. (G8918)  

## 2011-04-14 NOTE — Op Note (Signed)
[facility] [address] [place], [place]  [address]  COLONOSCOPY PROCEDURE REPORT  PATIENT:  Jeffery Cline, Jeffery Cline  MR#:  [id number] BIRTHDATE:  May 25, 1945, 65 yrs. old  GENDER:  male ENDOSCOPIST:  Iva Boop, MD, [facility]  PROCEDURE DATE:  04/14/2011 PROCEDURE:  Colonoscopy with biopsy and snare polypectomy ASA CLASS:  Class II INDICATIONS:  heme positive stool MEDICATIONS:   These medications were titrated to patient response per physician's verbal order, Fentanyl 75 mcg IV, Versed 9 mg  DESCRIPTION OF PROCEDURE:   After the risks benefits and alternatives of the procedure were thoroughly explained, informed consent was obtained.  Digital rectal exam was performed and revealed no rectal masses and normal prostate.   The LB 180AL [id number] endoscope was introduced through the anus and advanced to the cecum, which was identified by both the appendix and ileocecal valve, without limitations.  The quality of the prep was excellent, using MoviPrep.  The instrument was then slowly withdrawn as the colon was fully examined. <<PROCEDUREIMAGES>>  FINDINGS:  Three polyps were found. Diminutive splenic flexure (4 mm and 2mm removed cold snare and bx), 2-3 mm rectal polyp removed cold biopsy.  Moderate diverticulosis was found in the sigmoid colon.  This was otherwise a normal examination of the colon. Retroflexed views in the rectum revealed internal hemorrhoids. The time to cecum = 5:29 minutes. The scope was then withdrawn in 11:00 minutes from the cecum and the procedure completed. COMPLICATIONS:  None ENDOSCOPIC IMPRESSION: 1) Three diminutive polyps removed 2) Moderate diverticulosis in the sigmoid colon 3) Internal hemorrhoids - probable cause of heme + stool 4) Otherwise normal examination  REPEAT EXAM:  In for Colonoscopy, pending biopsy results.  Iva Boop, MD, Clementeen Graham  CC:  Willow Ora, MD and The Patient  n. eSIGNED:   Iva Boop at 04/14/2011 10:10  AM  [name], [phone]

## 2011-04-14 NOTE — Patient Instructions (Signed)
YOU HAD AN ENDOSCOPIC PROCEDURE TODAY AT THE [facility]: Refer to the procedure report that was given to you for any specific questions about what was found during the examination.  If the procedure report does not answer your questions, please call your gastroenterologist to clarify.  If you requested that your care partner not be given the details of your procedure findings, then the procedure report has been included in a sealed envelope for you to review at your convenience later.  YOU SHOULD EXPECT: Some feelings of bloating in the abdomen. Passage of more gas than usual.  Walking can help get rid of the air that was put into your GI tract during the procedure and reduce the bloating. If you had a lower endoscopy (such as a colonoscopy or flexible sigmoidoscopy) you may notice spotting of blood in your stool or on the toilet paper. If you underwent a bowel prep for your procedure, then you may not have a normal bowel movement for a few days.  DIET: Your first meal following the procedure should be a light meal and then it is ok to progress to your normal diet.  A half-sandwich or bowl of soup is an example of a good first meal.  Heavy or fried foods are harder to digest and may make you feel nauseous or bloated.  Likewise meals heavy in dairy and vegetables can cause extra gas to form and this can also increase the bloating.  Drink plenty of fluids but you should avoid alcoholic beverages for 24 hours.  ACTIVITY: Your care partner should take you home directly after the procedure.  You should plan to take it easy, moving slowly for the rest of the day.  You can resume normal activity the day after the procedure however you should NOT DRIVE or use heavy machinery for 24 hours (because of the sedation medicines used during the test).    SYMPTOMS TO REPORT IMMEDIATELY: A gastroenterologist can be reached at any hour.  During normal business hours, 8:30 AM to 5:00 PM Monday through Friday,  call [phone].  After hours and on weekends, please call the GI answering service at [phone] who will take a message and have the physician on call contact you.   Following lower endoscopy (colonoscopy or flexible sigmoidoscopy):  Excessive amounts of blood in the stool  Significant tenderness or worsening of abdominal pains  Swelling of the abdomen that is new, acute  Fever of 100F or higher  Following upper endoscopy (EGD)  Vomiting of blood or coffee ground material  New chest pain or pain under the shoulder blades  Painful or persistently difficult swallowing  New shortness of breath  Fever of 100F or higher  Black, tarry-looking stools  FOLLOW UP: If any biopsies were taken you will be contacted by phone or by letter within the next 1-3 weeks.  Call your gastroenterologist if you have not heard about the biopsies in 3 weeks.  Our staff will call the home number listed on your records the next business day following your procedure to check on you and address any questions or concerns that you may have at that time regarding the information given to you following your procedure. This is a courtesy call and so if there is no answer at the home number and we have not heard from you through the emergency physician on call, we will assume that you have returned to your regular daily activities without incident.  SIGNATURES/CONFIDENTIALITY: You and/or your care   partner have signed paperwork which will be entered into your electronic medical record.  These signatures attest to the fact that that the information above on your After Visit Summary has been reviewed and is understood.  Full responsibility of the confidentiality of this discharge information lies with you and/or your care-partner.  

## 2011-04-15 ENCOUNTER — Telehealth: Payer: Self-pay | Admitting: *Deleted

## 2011-04-15 NOTE — Telephone Encounter (Signed)
  Follow up Call-  Call back number 04/14/2011  Post procedure Call Back phone  # [phone]  Permission to leave phone message Yes     Patient questions:  Do you have a fever, pain , or abdominal swelling? no Pain Score  0 *  Have you tolerated food without any problems? yes  Have you been able to return to your normal activities? yes  Do you have any questions about your discharge instructions: Diet   no Medications  no Follow up visit  no  Do you have questions or concerns about your Care? no  Actions: * If pain score is 4 or above: No action needed, pain <4.

## 2011-04-18 ENCOUNTER — Other Ambulatory Visit: Payer: Self-pay | Admitting: Internal Medicine

## 2011-04-20 NOTE — Telephone Encounter (Signed)
Prescription sent to pharmacy.

## 2011-04-21 ENCOUNTER — Encounter: Payer: Self-pay | Admitting: Internal Medicine

## 2011-04-21 DIAGNOSIS — Z860100 Personal history of colon polyps, unspecified: Secondary | ICD-10-CM | POA: Insufficient documentation

## 2011-04-21 NOTE — Progress Notes (Signed)
Quick Note:  2 diminutive adenomas and one hyperplastic Repeat colonoscopy 5 years - approx 04/2016 ______

## 2011-06-21 ENCOUNTER — Other Ambulatory Visit: Payer: Self-pay | Admitting: Internal Medicine

## 2011-06-22 NOTE — Telephone Encounter (Signed)
Refill done.  

## 2011-09-21 ENCOUNTER — Other Ambulatory Visit: Payer: Self-pay | Admitting: Internal Medicine

## 2011-09-21 MED ORDER — NEBIVOLOL HCL 20 MG PO TABS: 20.0000 mg | ORAL_TABLET | Freq: Every day | ORAL | Status: DC

## 2011-09-21 NOTE — Telephone Encounter (Signed)
Refill Bystolic 20mg  Tablet #30 take one tablet every day last fill 7.13.13 Last ov 1.4.13 acute

## 2011-10-16 ENCOUNTER — Ambulatory Visit (INDEPENDENT_AMBULATORY_CARE_PROVIDER_SITE_OTHER): Payer: Medicare HMO | Admitting: Internal Medicine

## 2011-10-16 ENCOUNTER — Encounter: Payer: Self-pay | Admitting: Internal Medicine

## 2011-10-16 VITALS — BP 132/84 | HR 68 | Temp 98.1°F | Wt 240.0 lb

## 2011-10-16 DIAGNOSIS — L0292 Furuncle, unspecified: Secondary | ICD-10-CM | POA: Insufficient documentation

## 2011-10-16 DIAGNOSIS — L723 Sebaceous cyst: Secondary | ICD-10-CM

## 2011-10-16 DIAGNOSIS — I10 Essential (primary) hypertension: Secondary | ICD-10-CM

## 2011-10-16 DIAGNOSIS — N529 Male erectile dysfunction, unspecified: Secondary | ICD-10-CM

## 2011-10-16 MED ORDER — FENOFIBRATE 145 MG PO TABS: 145.0000 mg | ORAL_TABLET | Freq: Every day | ORAL | Status: DC

## 2011-10-16 MED ORDER — DOXYCYCLINE HYCLATE 100 MG PO TABS: 100.0000 mg | ORAL_TABLET | Freq: Two times a day (BID) | ORAL | Status: AC

## 2011-10-16 MED ORDER — METOPROLOL TARTRATE 50 MG PO TABS: 50.0000 mg | ORAL_TABLET | Freq: Two times a day (BID) | ORAL | Status: DC

## 2011-10-16 MED ORDER — AMLODIPINE BESYLATE 5 MG PO TABS: 5.0000 mg | ORAL_TABLET | Freq: Every day | ORAL | Status: DC

## 2011-10-16 MED ORDER — LOSARTAN POTASSIUM 100 MG PO TABS: 100.0000 mg | ORAL_TABLET | Freq: Every day | ORAL | Status: DC

## 2011-10-16 NOTE — Assessment & Plan Note (Signed)
We change it metoprolol to bystolic hoping to help ED but things have not changed consequently he likes to go back to metoprolol which is a generic.

## 2011-10-16 NOTE — Assessment & Plan Note (Signed)
Infection at the right under arm. Not fluctuant as of this morning Plan: Warm compresses, doxycycline for  7-10 days, if he comes to a head he knows to return for a I&D

## 2011-10-16 NOTE — Patient Instructions (Addendum)
Doxycycline twice a day for 7-10 days Warm compress Come back if it comes to a head Next visit by December for a physical Check the  blood pressure 2 or 3 times a week, be sure it is between 110/60 and 140/85. If it is consistently higher or lower, let me know

## 2011-10-16 NOTE — Assessment & Plan Note (Signed)
We took about possible therapy with Viagra, he's not interested at this point. Would consider check a testosterone level when he comes back

## 2011-10-16 NOTE — Progress Notes (Signed)
  Subjective:    Patient ID: Jeffery Cline, male    DOB: 09/18/1945, 66 y.o.   MRN: [id number]  HPI Acute visit, we discussed the following issues: Yesterday he developed redness and pain at the right under arm. Hypertension, we changed metoprolol to bystolic, likes to go back to metoprolol, see assessment and plan.  Past Medical History:   Hyperlipidemia   Hypertension   ED   decreased L carotid pulse (?) : Nl carotidu/s 02-2006   Sebaceous cyst in the back on-off   Past Surgical History:   SLEEP STUDY (2003): neg   Pilonidal cyst   Inguinal herniorrhaphy (L)  Social History:   Married. 2 kids ([place] and [place])   5 grandkids   retired   Alcohol use-yes (occ beer)   Never Smoked   Drug use-no   Review of Systems No fever or chills Has not seen any discharge from the under arm Has been doing well, had a root canal 10 days ago, has been taking amoxicillin in the last few days    Objective:   Physical Exam Alert oriented x3, no apparent distress, vital signs are stable Has a 3x2 cm area of redness, swelling and tenderness at the inner right arm very close to the axillary area. There is not fluctuant. No discharge or openings. Mild redness in the skin around it      Assessment & Plan:

## 2011-11-08 ENCOUNTER — Other Ambulatory Visit: Payer: Self-pay | Admitting: Internal Medicine

## 2011-11-09 NOTE — Telephone Encounter (Signed)
Spoke with Morrie Sheldon at the pharmacy & she confirmed pt still has refills left on file. Refill request sent in error.

## 2011-11-12 ENCOUNTER — Encounter (INDEPENDENT_AMBULATORY_CARE_PROVIDER_SITE_OTHER): Payer: Self-pay | Admitting: Surgery

## 2011-11-12 ENCOUNTER — Ambulatory Visit (INDEPENDENT_AMBULATORY_CARE_PROVIDER_SITE_OTHER): Payer: Medicare HMO | Admitting: Surgery

## 2011-11-12 VITALS — BP 142/86 | HR 68 | Temp 98.2°F | Resp 14 | Ht 70.0 in | Wt 241.2 lb

## 2011-11-12 DIAGNOSIS — L723 Sebaceous cyst: Secondary | ICD-10-CM

## 2011-11-12 NOTE — Progress Notes (Signed)
This patient presents with a infected sebaceous cyst on the back of his neck right at the hairline. He states that he had a cyst here about 30 years ago that was incised and drained. However it seem to have recurred. Last week it drained a large amount of foul-smelling purulent fluid. It has recurred and is quite tender.  He presents now for urgent evaluation.  Filed Vitals:   11/12/11 1543  BP: 142/86  Pulse: 68  Temp: 98.2 F (36.8 C)  Resp: 14    On the posterior neck at the hairline there is a 3 cm area of induration. There is a punctate opening with some purulent drainage. We positioned him in a prone position. I prepped this area Betadine and anesthetized with 1% lidocaine. I made a 2 cm transverse incision. We entered a large sebaceous cyst and expressed a large amount of purulent fluid and sebum. We debrided some of the cyst wall but this is fairly difficult due to the inflammation. We packed the wound with quarter-inch Nu Gauze. The patient will remove the dressing in 24 hours and will followup in 2 weeks. He will continue dressing the wound with Neosporin and a dry dressing.  Wilmon Arms. Corliss Skains, MD, [facility]  11/12/2011 4:14 PM

## 2011-11-26 ENCOUNTER — Ambulatory Visit (INDEPENDENT_AMBULATORY_CARE_PROVIDER_SITE_OTHER): Payer: Medicare HMO | Admitting: Surgery

## 2011-11-26 ENCOUNTER — Encounter (INDEPENDENT_AMBULATORY_CARE_PROVIDER_SITE_OTHER): Payer: Self-pay | Admitting: Surgery

## 2011-11-26 VITALS — BP 129/82 | HR 77 | Temp 98.6°F | Resp 16 | Ht 70.0 in | Wt 233.4 lb

## 2011-11-26 DIAGNOSIS — L723 Sebaceous cyst: Secondary | ICD-10-CM

## 2011-11-26 NOTE — Progress Notes (Signed)
Status post excision of an infected sebaceous cyst on the back of his neck. This area is completely healed. No sign of inflammation. No drainage noted. The patient currently does not have any other sebaceous cysts that are large enough for excision. If he does develop another large cyst, I encouraged him to call us to excise these before they become infected. If he calls to schedule an appointment, we will just schedule him for the last appointment of the afternoon so we can excise it at the time of evaluation.  Jeffery Cline. Corliss Skains, MD, [facility]  11/26/2011 9:13 AM

## 2012-03-01 ENCOUNTER — Ambulatory Visit (INDEPENDENT_AMBULATORY_CARE_PROVIDER_SITE_OTHER): Payer: Medicare HMO | Admitting: Internal Medicine

## 2012-03-01 ENCOUNTER — Encounter: Payer: Self-pay | Admitting: Internal Medicine

## 2012-03-01 VITALS — BP 140/82 | HR 56 | Temp 97.8°F | Ht 70.0 in | Wt 231.0 lb

## 2012-03-01 DIAGNOSIS — I10 Essential (primary) hypertension: Secondary | ICD-10-CM

## 2012-03-01 DIAGNOSIS — N529 Male erectile dysfunction, unspecified: Secondary | ICD-10-CM

## 2012-03-01 DIAGNOSIS — Z0000 Encounter for general adult medical examination without abnormal findings: Secondary | ICD-10-CM

## 2012-03-01 DIAGNOSIS — E785 Hyperlipidemia, unspecified: Secondary | ICD-10-CM

## 2012-03-01 LAB — BASIC METABOLIC PANEL
BUN: 16 mg/dL (ref 6–23)
CO2: 27 mEq/L (ref 19–32)
Calcium: 9.2 mg/dL (ref 8.4–10.5)
Glucose, Bld: 94 mg/dL (ref 70–99)
Sodium: 135 mEq/L (ref 135–145)

## 2012-03-01 LAB — CBC WITH DIFFERENTIAL/PLATELET
Basophils Relative: 0.4 % (ref 0.0–3.0)
Eosinophils Relative: 8 % — ABNORMAL HIGH (ref 0.0–5.0)
HCT: 43.5 % (ref 39.0–52.0)
Lymphs Abs: 2.7 10*3/uL (ref 0.7–4.0)
MCV: 91.3 fl (ref 78.0–100.0)
Monocytes Absolute: 1.1 10*3/uL — ABNORMAL HIGH (ref 0.1–1.0)
Platelets: 209 10*3/uL (ref 150.0–400.0)
WBC: 9 10*3/uL (ref 4.5–10.5)

## 2012-03-01 LAB — LIPID PANEL
Cholesterol: 197 mg/dL (ref 0–200)
HDL: 37.6 mg/dL — ABNORMAL LOW (ref >39.00)
Triglycerides: 147 mg/dL (ref 0.0–149.0)

## 2012-03-01 LAB — TSH: TSH: 0.93 u[IU]/mL (ref 0.35–5.50)

## 2012-03-01 NOTE — Assessment & Plan Note (Addendum)
Excellent control with current medications that includes amlodipine, losartan and Lopressor. No change EKG today, sinus bradycardia, at baseline Labs

## 2012-03-01 NOTE — Progress Notes (Signed)
  Subjective:    Patient ID: Jeffery Cline, male    DOB: [birth year], 67 y.o.   MRN: [id number]  HPI  Here for Medicare AWV: 1. Risk factors based on Past M, S, F history: reviewed 2. Physical Activities: walks 2 miles x 4 /week    3. Depression/mood: (-) screening   4. Hearing: no problems noted or reported     5. ADL's: totally independent     6. Fall Risk: no recent falls, low risk, see instructions 7. home Safety: does feelsafe at home   8. Height, weight, &visual acuity: see VS, normal vision, uses reading glasses, sees eye doctor, ?early glaucoma    9. Counseling: provided 10. Labs ordered based on risk factors: if needed   11. Referral Coordination: if needed 12.  Care Plan, see assessment and plan   13.   Cognitive Assessment: Motor exam and cognition are normal  In addition, today we discussed the following: Hypertension, good medication compliance, normal ambulatory BPs Some stress related to family issues however he denies anxiety per se, sleeping well. High cholesterol, good medication compliance. ED, still having some problems but ED is not a major problem for him  right now.  Past Medical History:   Hyperlipidemia   Hypertension   ED   decreased L carotid pulse (?) : Nl carotidu/s 02-2006   Sebaceous cyst in the back on-off   Past Surgical History:   SLEEP STUDY (2003): neg   Pilonidal cyst   Inguinal herniorrhaphy (L)  Social History:   Married. 2 sons ([place] and [place]) , 5 grandkids   retired   Alcohol use-yes (occ beer)   Never Smoked   Drug use-no   Family History  Problem Relation Age of Onset  . Dementia Mother   . Coronary artery disease Father   . Coronary artery disease Maternal Grandfather   . Leukemia Paternal Grandfather   . Alcohol abuse Mother   . Colon cancer Neg Hx     Review of Systems Denies chest pain or shortness or breath No nausea, vomiting, diarrhea or blood in the stools. No dysuria or gross hematuria.     Objective:   Physical Exam  General -- alert, well-developed, + central obesity.   Neck --no thyromegaly , normal carotid pulse Lungs -- normal respiratory effort, no intercostal retractions, no accessory muscle use, and normal breath sounds.   Heart-- normal rate, regular rhythm, no murmur, and no gallop.   Abdomen--soft, non-tender, no distention, no masses, no HSM, no guarding, and no rigidity.  No bruit  Extremities-- no pretibial edema bilaterally Rectal-- No external abnormalities noted. Normal sphincter tone. No rectal masses or tenderness. Brown stool Prostate:  Prostate gland firm and smooth, no enlargement, nodularity, tenderness, mass, asymmetry or induration. Neurologic-- alert & oriented X3 and strength normal in all extremities. Psych-- Cognition and judgment appear intact. Alert and cooperative with normal attention span and concentration.  not anxious appearing and not depressed appearing.       Assessment & Plan:

## 2012-03-01 NOTE — Assessment & Plan Note (Signed)
Good compliance with medications, check FLP 

## 2012-03-01 NOTE — Patient Instructions (Addendum)

## 2012-03-01 NOTE — Assessment & Plan Note (Signed)
Td 09-2008, pneumonia 2012, Zostavax 02-2008 Had a flu shot already  Colonoscopy:   Date:  01/09/2002, normal Colonoscopy 04/2011, 2 polyps, Dr. Leone Payor, next 2018.  continue with his healthy lifestyle, discussed diet    Prostate cancer screening, negative family history, DRE normal, PSAs have been consistently normal, check a PSA next year

## 2012-03-01 NOTE — Assessment & Plan Note (Signed)
Still has some issues, currently not a major problem for the patient, we discussed medication but he's not interested, he has a history of vascular headaches.

## 2012-03-03 ENCOUNTER — Encounter: Payer: Self-pay | Admitting: Internal Medicine

## 2012-03-07 ENCOUNTER — Encounter: Payer: Self-pay | Admitting: *Deleted

## 2012-03-26 ENCOUNTER — Other Ambulatory Visit: Payer: Self-pay

## 2012-03-28 ENCOUNTER — Encounter: Payer: Self-pay | Admitting: Internal Medicine

## 2012-03-28 ENCOUNTER — Ambulatory Visit (INDEPENDENT_AMBULATORY_CARE_PROVIDER_SITE_OTHER): Payer: Medicare HMO | Admitting: Internal Medicine

## 2012-03-28 VITALS — BP 132/80 | HR 68 | Temp 97.9°F | Wt 227.0 lb

## 2012-03-28 DIAGNOSIS — J209 Acute bronchitis, unspecified: Secondary | ICD-10-CM

## 2012-03-28 MED ORDER — AZITHROMYCIN 250 MG PO TABS: ORAL_TABLET | ORAL | Status: DC

## 2012-03-28 MED ORDER — HYDROCODONE-HOMATROPINE 5-1.5 MG/5ML PO SYRP: 5.0000 mL | ORAL_SOLUTION | Freq: Three times a day (TID) | ORAL | Status: DC | PRN

## 2012-03-28 NOTE — Progress Notes (Signed)
  Subjective:    Patient ID: Jeffery Cline, male    DOB: Jul 03, 1945, 67 y.o.   MRN: [id number]  HPI Acute visit One-week history of productive cough, throughout the day, worse at night, he is bringing up sputum, initially clear and now yellow . Does not feel well in general, feeling tired. Taking Mucinex.  Past Medical History  Diagnosis Date  . Hyperlipidemia   . Hypertension   . ED (erectile dysfunction)   . Allergic rhinitis   . GERD (gastroesophageal reflux disease)   . Decreased carotid pulse     decreased L carotid pulse (?) : Nl carotidu/s 02-2006     Past Surgical History  Procedure Laterality Date  . Pilonidal cyst excision    . Inguinal hernia repair      right by dr. Magnus Ivan  . Cystectomy      back  . Colonoscopy  01/09/2002    internal and external hemorrhoids  . Tonsillectomy       Review of Systems No fever chills No nausea vomiting No myalgias or shortness or breath    Objective:   Physical Exam  General -- alert, well-developed  HEENT -- TMs normal, throat w/o redness, face symmetric and not tender to palpation, nose slightly congested   Lungs -- normal respiratory effort, no intercostal retractions, no accessory muscle use, and few rhonchi bilaterally, no wheezing or crackles  Heart-- normal rate, regular rhythm, no murmur, and no gallop.    Psych-- Cognition and judgment appear intact. Alert and cooperative with normal attention span and concentration.  not anxious appearing and not depressed appearing.       Assessment & Plan:  Bronchitis, see instructions

## 2012-03-28 NOTE — Patient Instructions (Addendum)
Rest, fluids , tylenol For cough, take Mucinex or Robitussin DM twice a day as needed  For severe cough, use hydrocodone Take the antibiotic as prescribed  (Zithromax) only if not improving in the next 2 or 3 days Call if no better by next week Call anytime if the symptoms are severe

## 2012-04-14 ENCOUNTER — Other Ambulatory Visit: Payer: Self-pay | Admitting: Internal Medicine

## 2012-04-14 NOTE — Telephone Encounter (Signed)
Refill done.  

## 2012-05-14 ENCOUNTER — Other Ambulatory Visit: Payer: Self-pay | Admitting: Internal Medicine

## 2012-05-16 NOTE — Telephone Encounter (Signed)
Refill done.  

## 2012-07-17 ENCOUNTER — Other Ambulatory Visit: Payer: Self-pay | Admitting: Internal Medicine

## 2012-07-18 NOTE — Telephone Encounter (Signed)
Pt has Rx on file from 05-14-12 and march 2014

## 2012-10-11 ENCOUNTER — Other Ambulatory Visit: Payer: Self-pay | Admitting: Internal Medicine

## 2012-10-11 NOTE — Telephone Encounter (Signed)
Refill done.  

## 2012-12-01 ENCOUNTER — Other Ambulatory Visit: Payer: Self-pay | Admitting: Dermatology

## 2012-12-10 ENCOUNTER — Other Ambulatory Visit: Payer: Self-pay | Admitting: Internal Medicine

## 2012-12-12 NOTE — Telephone Encounter (Signed)
Refills for metoprolol, losartan and fenofibrate sent to pharmacy

## 2012-12-15 ENCOUNTER — Other Ambulatory Visit: Payer: Self-pay

## 2013-01-16 ENCOUNTER — Other Ambulatory Visit: Payer: Self-pay | Admitting: Internal Medicine

## 2013-01-16 MED ORDER — FENOFIBRATE 145 MG PO TABS: ORAL_TABLET | ORAL | Status: DC

## 2013-01-16 MED ORDER — AMLODIPINE BESYLATE 5 MG PO TABS: ORAL_TABLET | ORAL | Status: DC

## 2013-01-16 MED ORDER — METOPROLOL TARTRATE 50 MG PO TABS: ORAL_TABLET | ORAL | Status: DC

## 2013-01-16 MED ORDER — LOSARTAN POTASSIUM 100 MG PO TABS: ORAL_TABLET | ORAL | Status: DC

## 2013-01-16 NOTE — Telephone Encounter (Signed)
rx refiled per protocol DJR  

## 2013-02-16 ENCOUNTER — Other Ambulatory Visit: Payer: Self-pay | Admitting: Internal Medicine

## 2013-03-03 ENCOUNTER — Telehealth: Payer: Self-pay

## 2013-03-03 NOTE — Telephone Encounter (Signed)
Medication List and allergies:  Updated and Reviewed  90 day supply/mail order: n/a Local prescriptions:  [facility] on [address]  Immunization due:  UTD  A/P: No changes to personal, family or Mutual Flu- 11/2012 per pt.  Tdap- 09/18/08 PNA- 01/20/11 Shingles- 02/21/08 CCS - 04/14/11- adenomatous polyps, diverticulosis, and internal hemorrhoids.  Recommended every 5 years.   PSA- 03/01/12-1.45   To discuss with provider: Not at this time.

## 2013-03-06 ENCOUNTER — Ambulatory Visit (INDEPENDENT_AMBULATORY_CARE_PROVIDER_SITE_OTHER): Payer: Medicare HMO | Admitting: Internal Medicine

## 2013-03-06 ENCOUNTER — Encounter: Payer: Self-pay | Admitting: Internal Medicine

## 2013-03-06 VITALS — BP 132/87 | HR 60 | Temp 98.0°F | Ht 69.2 in | Wt 233.0 lb

## 2013-03-06 DIAGNOSIS — E785 Hyperlipidemia, unspecified: Secondary | ICD-10-CM

## 2013-03-06 DIAGNOSIS — I10 Essential (primary) hypertension: Secondary | ICD-10-CM

## 2013-03-06 DIAGNOSIS — R399 Unspecified symptoms and signs involving the genitourinary system: Secondary | ICD-10-CM

## 2013-03-06 DIAGNOSIS — N529 Male erectile dysfunction, unspecified: Secondary | ICD-10-CM

## 2013-03-06 DIAGNOSIS — Z125 Encounter for screening for malignant neoplasm of prostate: Secondary | ICD-10-CM

## 2013-03-06 DIAGNOSIS — Z0000 Encounter for general adult medical examination without abnormal findings: Secondary | ICD-10-CM

## 2013-03-06 DIAGNOSIS — R3989 Other symptoms and signs involving the genitourinary system: Secondary | ICD-10-CM

## 2013-03-06 LAB — COMPREHENSIVE METABOLIC PANEL
ALBUMIN: 4.4 g/dL (ref 3.5–5.2)
ALT: 36 U/L (ref 0–53)
AST: 23 U/L (ref 0–37)
Alkaline Phosphatase: 35 U/L — ABNORMAL LOW (ref 39–117)
BUN: 16 mg/dL (ref 6–23)
CALCIUM: 9.1 mg/dL (ref 8.4–10.5)
CHLORIDE: 105 meq/L (ref 96–112)
CO2: 26 mEq/L (ref 19–32)
Creatinine, Ser: 1 mg/dL (ref 0.4–1.5)
GFR: 79.04 mL/min (ref >60.00)
GLUCOSE: 90 mg/dL (ref 70–99)
POTASSIUM: 3.8 meq/L (ref 3.5–5.1)
Sodium: 139 mEq/L (ref 135–145)
TOTAL PROTEIN: 7.2 g/dL (ref 6.0–8.3)
Total Bilirubin: 1 mg/dL (ref 0.3–1.2)

## 2013-03-06 LAB — URINALYSIS, ROUTINE W REFLEX MICROSCOPIC
BILIRUBIN URINE: NEGATIVE
Hgb urine dipstick: NEGATIVE
Ketones, ur: NEGATIVE
Nitrite: NEGATIVE
Specific Gravity, Urine: 1.025 (ref 1.000–1.030)
TOTAL PROTEIN, URINE-UPE24: NEGATIVE
URINE GLUCOSE: NEGATIVE
Urobilinogen, UA: 0.2 (ref 0.0–1.0)
pH: 6.5 (ref 5.0–8.0)

## 2013-03-06 LAB — LIPID PANEL
CHOLESTEROL: 177 mg/dL (ref 0–200)
HDL: 33.3 mg/dL — ABNORMAL LOW (ref >39.00)
LDL CALC: 106 mg/dL — AB (ref 0–99)
TRIGLYCERIDES: 187 mg/dL — AB (ref 0.0–149.0)
Total CHOL/HDL Ratio: 5
VLDL: 37.4 mg/dL (ref 0.0–40.0)

## 2013-03-06 LAB — CBC WITH DIFFERENTIAL/PLATELET
Basophils Absolute: 0 10*3/uL (ref 0.0–0.1)
Basophils Relative: 0.5 % (ref 0.0–3.0)
EOS PCT: 5.9 % — AB (ref 0.0–5.0)
Eosinophils Absolute: 0.6 10*3/uL (ref 0.0–0.7)
HEMATOCRIT: 43.9 % (ref 39.0–52.0)
Hemoglobin: 14.8 g/dL (ref 13.0–17.0)
Lymphocytes Relative: 31.5 % (ref 12.0–46.0)
Lymphs Abs: 3.1 10*3/uL (ref 0.7–4.0)
MCHC: 33.6 g/dL (ref 30.0–36.0)
MCV: 94.5 fl (ref 78.0–100.0)
MONOS PCT: 10.9 % (ref 3.0–12.0)
Monocytes Absolute: 1.1 10*3/uL — ABNORMAL HIGH (ref 0.1–1.0)
NEUTROS PCT: 51.2 % (ref 43.0–77.0)
Neutro Abs: 5 10*3/uL (ref 1.4–7.7)
Platelets: 212 10*3/uL (ref 150.0–400.0)
RBC: 4.65 Mil/uL (ref 4.22–5.81)
RDW: 12.5 % (ref 11.5–14.6)
WBC: 9.8 10*3/uL (ref 4.5–10.5)

## 2013-03-06 LAB — PSA: PSA: 1.44 ng/mL (ref 0.10–4.00)

## 2013-03-06 MED ORDER — AMLODIPINE BESYLATE 5 MG PO TABS
ORAL_TABLET | ORAL | Status: DC
Start: 2013-03-06 — End: 2013-10-09

## 2013-03-06 MED ORDER — LOSARTAN POTASSIUM 100 MG PO TABS: ORAL_TABLET | ORAL | Status: DC

## 2013-03-06 MED ORDER — METOPROLOL TARTRATE 50 MG PO TABS: ORAL_TABLET | ORAL | Status: DC

## 2013-03-06 MED ORDER — FENOFIBRATE 145 MG PO TABS: ORAL_TABLET | ORAL | Status: DC

## 2013-03-06 NOTE — Patient Instructions (Signed)
Get your blood work before you leave   Next visit is for a physical exam in 1 year ,  fasting Please make an appointment    Check the  blood pressure 2  times a month  be sure it is between 110/60 and 140/85. Ideal blood pressure is 120/80. If it is consistently higher or lower, let me know   Fall Prevention and Home Safety Falls cause injuries and can affect all age groups. It is possible to use preventive measures to significantly decrease the likelihood of falls. There are many simple measures which can make your home safer and prevent falls. OUTDOORS  Repair cracks and edges of walkways and driveways.  Remove high doorway thresholds.  Trim shrubbery on the main path into your home.  Have good outside lighting.  Clear walkways of tools, rocks, debris, and clutter.  Check that handrails are not broken and are securely fastened. Both sides of steps should have handrails.  Have leaves, snow, and ice cleared regularly.  Use sand or salt on walkways during winter months.  In the garage, clean up grease or oil spills. BATHROOM  Install night lights.  Install grab bars by the toilet and in the tub and shower.  Use non-skid mats or decals in the tub or shower.  Place a plastic non-slip stool in the shower to sit on, if needed.  Keep floors dry and clean up all water on the floor immediately.  Remove soap buildup in the tub or shower on a regular basis.  Secure bath mats with non-slip, double-sided rug tape.  Remove throw rugs and tripping hazards from the floors. BEDROOMS  Install night lights.  Make sure a bedside light is easy to reach.  Do not use oversized bedding.  Keep a telephone by your bedside.  Have a firm chair with side arms to use for getting dressed.  Remove throw rugs and tripping hazards from the floor. KITCHEN  Keep handles on pots and pans turned toward the center of the stove. Use back burners when possible.  Clean up spills quickly and  allow time for drying.  Avoid walking on wet floors.  Avoid hot utensils and knives.  Position shelves so they are not too high or low.  Place commonly used objects within easy reach.  If necessary, use a sturdy step stool with a grab bar when reaching.  Keep electrical cables out of the way.  Do not use floor polish or wax that makes floors slippery. If you must use wax, use non-skid floor wax.  Remove throw rugs and tripping hazards from the floor. STAIRWAYS  Never leave objects on stairs.  Place handrails on both sides of stairways and use them. Fix any loose handrails. Make sure handrails on both sides of the stairways are as long as the stairs.  Check carpeting to make sure it is firmly attached along stairs. Make repairs to worn or loose carpet promptly.  Avoid placing throw rugs at the top or bottom of stairways, or properly secure the rug with carpet tape to prevent slippage. Get rid of throw rugs, if possible.  Have an electrician put in a light switch at the top and bottom of the stairs. OTHER FALL PREVENTION TIPS  Wear low-heel or rubber-soled shoes that are supportive and fit well. Wear closed toe shoes.  When using a stepladder, make sure it is fully opened and both spreaders are firmly locked. Do not climb a closed stepladder.  Add color or contrast paint or  tape to grab bars and handrails in your home. Place contrasting color strips on first and last steps.  Learn and use mobility aids as needed. Install an electrical emergency response system.  Turn on lights to avoid dark areas. Replace light bulbs that burn out immediately. Get light switches that glow.  Arrange furniture to create clear pathways. Keep furniture in the same place.  Firmly attach carpet with non-skid or double-sided tape.  Eliminate uneven floor surfaces.  Select a carpet pattern that does not visually hide the edge of steps.  Be aware of all pets. OTHER HOME SAFETY TIPS  Set the  water temperature for 120 F (48.8 C).  Keep emergency numbers on or near the telephone.  Keep smoke detectors on every level of the home and near sleeping areas. Document Released: 01/16/2002 Document Revised: 07/28/2011 Document Reviewed: 04/17/2011 [facility] Patient Information 2014 [place].

## 2013-03-06 NOTE — Assessment & Plan Note (Addendum)
Mild nocturia, see review of systems, will check a UA, last PSA normal, repeat PSA today Discussed Flomax, not interested

## 2013-03-06 NOTE — Assessment & Plan Note (Signed)
Labs

## 2013-03-06 NOTE — Assessment & Plan Note (Signed)
Good compliance of medication,No recent ambulatory BPs, BP today normal, no change, labs

## 2013-03-06 NOTE — Progress Notes (Signed)
Subjective:    Patient ID: Jeffery Cline, male    DOB: 03-24-1945, 68 y.o.   MRN: [id number]  HPI Here for Medicare AWV:  1. Risk factors based on Past M, S, F history: reviewed  2. Physical Activities: walks 2 miles x 4 /week  3. Depression/mood: (-) screening  4. Hearing: no problems noted or reported  5. ADL's: totally independent  6. Fall Risk: no recent falls, low risk, see instructions  7. home Safety: does feel safe at home  8. Height, weight, &visual acuity: see VS, normal vision, uses reading glasses, sees eye doctor, d/t early glaucoma  9. Counseling: provided  10. Labs ordered based on risk factors: if needed  11. Referral Coordination: if needed  12. Care Plan, see assessment and plan  13. Cognitive Assessment: Motor exam and cognition are normal for age   In addition, today we discussed the following: ED--is still an issue, see assessment and plan Hypertension, good medication compliance, no apparent side effects High cholesterol ----on TriCor, good compliance LUTS--occasional symptoms, see review of systems and assessment and plan   Past Medical History  Diagnosis Date  . Hyperlipidemia   . Hypertension   . ED (erectile dysfunction)   . Allergic rhinitis   . GERD (gastroesophageal reflux disease)   . Decreased carotid pulse     decreased L carotid pulse (?) : Nl carotidu/s 02-2006     Past Surgical History  Procedure Laterality Date  . Pilonidal cyst excision    . Inguinal hernia repair      right by dr. Ninfa Linden  . Cystectomy      back  . Colonoscopy  01/09/2002    internal and external hemorrhoids  . Tonsillectomy     History   Social History  . Marital Status: Married    Spouse Name: N/A    Number of Children: N/A  . Years of Education: N/A   Occupational History  . Not on file.   Social History Main Topics  . Smoking status: Never Smoker   . Smokeless tobacco: Never Used  . Alcohol Use: Yes     Comment: less than 1 drink per day  .  Drug Use: No  . Sexual Activity: Not on file   Other Topics Concern  . Not on file   Social History Narrative  . No narrative on file   Family History  Problem Relation Age of Onset  . Dementia Mother   . Coronary artery disease Father   . Coronary artery disease Maternal Grandfather   . Leukemia Paternal Grandfather   . Alcohol abuse Mother   . Colon cancer Neg Hx   . Prostate cancer Neg Hx     Review of Systems No  CP, SOB Denies  nausea, vomiting diarrhea Denies  blood in the stools occ cough he think associated to GERD, sx decrease w/ prilosec prn (-) sputum production, (-) wheezing, no chest congestion No dysuria, gross hematuria   Had nocturia and mild diff going few months ago, now better, up ~ 2 per night which is normal for him     Objective:   Physical Exam  There were no vitals taken for this visit. General -- alert, well-developed, NAD.  Neck --no thyromegaly , normal carotid pulse, no carotid bruit HEENT-- Not pale.   Lungs -- normal respiratory effort, no intercostal retractions, no accessory muscle use, and normal breath sounds.  Heart-- normal rate, regular rhythm, no murmur.  Abdomen-- Not distended, good bowel  sounds,soft, non-tender. No  bruit. Rectal-- No external abnormalities noted. Normal sphincter tone. No rectal masses or tenderness. Brown stool. Prostate--Prostate gland firm and smooth, no enlargement, nodularity, tenderness, mass, asymmetry or induration. Extremities-- no pretibial edema bilaterally  Neurologic--  alert & oriented X3. Speech normal, gait normal, strength normal in all extremities.  Psych-- Cognition and judgment appear intact. Cooperative with normal attention span and concentration. No anxious or depressed appearing.     Assessment & Plan:

## 2013-03-06 NOTE — Assessment & Plan Note (Signed)
Again discussed medication, patient not interested at this time

## 2013-03-06 NOTE — Assessment & Plan Note (Addendum)
Td 09-2008 pneumonia 2012 Zostavax 02-2008 Had a flu shot already  Colonoscopy:   Date:  01/09/2002, normal Colonoscopy 04/2011, 2 polyps, Dr. Carlean Purl, next 2018.   Prostate cancer screening: DRE normal, checkina PSA today, has a neg family history    diet-exercise discussed

## 2013-03-06 NOTE — Progress Notes (Signed)
Pre visit review using our clinic review tool, if applicable. No additional management support is needed unless otherwise documented below in the visit note. 

## 2013-03-10 ENCOUNTER — Encounter: Payer: Self-pay | Admitting: *Deleted

## 2013-03-14 ENCOUNTER — Encounter: Payer: Self-pay | Admitting: Internal Medicine

## 2013-03-15 ENCOUNTER — Telehealth: Payer: Self-pay | Admitting: Internal Medicine

## 2013-03-15 NOTE — Telephone Encounter (Signed)
Patient called and scheduled a lab appointment for tomorrow. Please advise with lab order

## 2013-03-15 NOTE — Telephone Encounter (Signed)
Relevant patient education assigned to patient using Emmi. ° °

## 2013-03-16 ENCOUNTER — Other Ambulatory Visit (INDEPENDENT_AMBULATORY_CARE_PROVIDER_SITE_OTHER): Payer: Medicare HMO

## 2013-03-16 DIAGNOSIS — R399 Unspecified symptoms and signs involving the genitourinary system: Secondary | ICD-10-CM

## 2013-03-16 DIAGNOSIS — R3989 Other symptoms and signs involving the genitourinary system: Secondary | ICD-10-CM

## 2013-03-16 LAB — POCT URINALYSIS DIPSTICK
BILIRUBIN UA: NEGATIVE
Blood, UA: NEGATIVE
GLUCOSE UA: NEGATIVE
Ketones, UA: NEGATIVE
LEUKOCYTES UA: NEGATIVE
Nitrite, UA: NEGATIVE
PH UA: 5
Protein, UA: NEGATIVE
Spec Grav, UA: >=1.03
Urobilinogen, UA: 0.2

## 2013-03-17 LAB — URINE CULTURE
Colony Count: NO GROWTH
Organism ID, Bacteria: NO GROWTH

## 2013-04-11 ENCOUNTER — Encounter: Payer: Self-pay | Admitting: Internal Medicine

## 2013-04-11 ENCOUNTER — Ambulatory Visit (INDEPENDENT_AMBULATORY_CARE_PROVIDER_SITE_OTHER): Payer: Medicare HMO | Admitting: Internal Medicine

## 2013-04-11 VITALS — BP 117/73 | HR 60 | Temp 98.0°F | Wt 229.0 lb

## 2013-04-11 DIAGNOSIS — R05 Cough: Secondary | ICD-10-CM

## 2013-04-11 DIAGNOSIS — R059 Cough, unspecified: Secondary | ICD-10-CM

## 2013-04-11 DIAGNOSIS — J209 Acute bronchitis, unspecified: Secondary | ICD-10-CM

## 2013-04-11 MED ORDER — AZELASTINE HCL 0.1 % NA SOLN: 2.0000 | Freq: Every day | NASAL | Status: DC

## 2013-04-11 MED ORDER — AZITHROMYCIN 250 MG PO TABS: ORAL_TABLET | ORAL | Status: DC

## 2013-04-11 NOTE — Assessment & Plan Note (Signed)
Seen with an acute respiratory infection however he again complains about postnasal dripping and occasional GERD. Recommend PPIs daily for several weeks; also Nasacort OTC and astelin

## 2013-04-11 NOTE — Progress Notes (Signed)
Pre visit review using our clinic review tool, if applicable. No additional management support is needed unless otherwise documented below in the visit note. 

## 2013-04-11 NOTE — Progress Notes (Signed)
Subjective:    Patient ID: Jeffery Cline, male    DOB: [birth year], 68 y.o.   MRN: [id number]  DOS:  04/11/2013 Type of  visit: Acute visit  Symptoms started 2 weeks ago with cough, chest congestion, green sputum production. Overall slightly better today, less sputum production but still has cough.    ROS No fever chills No chest pain or difficulty breathing Occasional wheezing Occasional GERD symptoms, takes omeprazole as needed Last week had a three-day episode of nausea, vomiting, diarrhea; all GI symptoms resolved  Past Medical History  Diagnosis Date  . Hyperlipidemia   . Hypertension   . ED (erectile dysfunction)   . Allergic rhinitis   . GERD (gastroesophageal reflux disease)   . Decreased carotid pulse     decreased L carotid pulse (?) : Nl carotidu/s 02-2006      Past Surgical History  Procedure Laterality Date  . Pilonidal cyst excision    . Inguinal hernia repair      right by dr. Ninfa Linden  . Cystectomy      back  . Colonoscopy  01/09/2002    internal and external hemorrhoids  . Tonsillectomy      History   Social History  . Marital Status: Married    Spouse Name: N/A    Number of Children: 2  . Years of Education: N/A   Occupational History  . retired    Social History Main Topics  . Smoking status: Never Smoker   . Smokeless tobacco: Never Used  . Alcohol Use: Yes     Comment: less than 1 drink per day  . Drug Use: No  . Sexual Activity: Not on file   Other Topics Concern  . Not on file   Social History Narrative   Married. 2 sons ([place] and [place]) , 5 grandkids          Medication List       This list is accurate as of: 04/11/13  3:15 PM.  Always use your most recent med list.               amLODipine 5 MG tablet  Commonly known as:  NORVASC  TAKE 1 TABLET BY MOUTH EVERY DAY     aspirin 81 MG tablet  Take 81 mg by mouth daily.     azelastine 137 MCG/SPRAY nasal spray  Commonly known as:  ASTELIN  Place 2 sprays into both  nostrils at bedtime.     azithromycin 250 MG tablet  Commonly known as:  ZITHROMAX Z-PAK  As directed     bimatoprost 0.01 % Soln  Commonly known as:  LUMIGAN  1 drop at bedtime.     fenofibrate 145 MG tablet  Commonly known as:  TRICOR  TAKE 1 TABLET (145 MG TOTAL) BY MOUTH DAILY.     losartan 100 MG tablet  Commonly known as:  COZAAR  TAKE 1 TABLET (100 MG TOTAL) BY MOUTH DAILY.     metoprolol 50 MG tablet  Commonly known as:  LOPRESSOR  TAKE 1 TABLET (50 MG TOTAL) BY MOUTH 2 (TWO) TIMES DAILY.           Objective:   Physical Exam BP 117/73  Pulse 60  Temp(Src) 98 F (36.7 C)  Wt 229 lb (103.874 kg)  SpO2 96% General -- alert, well-developed, NAD.  HEENT-- Not pale. TMs obscured by wax, no d/c;  throat symmetric, no redness or discharge. Face symmetric, sinuses not tender to palpation. Nose moderately  congested.  Lungs -- normal respiratory effort, no intercostal retractions, no accessory muscle use, and + ronchi w/  Cough, no wheezing .  Heart-- normal rate, regular rhythm, no murmur.   Extremities-- no pretibial edema bilaterally  Neurologic--  alert & oriented X3. Speech normal, gait normal, strength normal in all extremities.   Psych-- Cognition and judgment appear intact. Cooperative with normal attention span and concentration. No anxious or depressed appearing.       Assessment & Plan:  Symptoms consistent with acute bronchitis, see instructions

## 2013-04-11 NOTE — Patient Instructions (Signed)
Rest, fluids , tylenol For cough, take Mucinex DM twice a day as needed   Take the antibiotic as prescribed  (zpack ) Call if no better in few days Call anytime if the symptoms are severe, you have high fever, short of breath, chest pain  For postnasal dripping:  OTC Nasocort: 2 nasal sprays on each side of the nose in the morning ASTELIN at night  GERD: OTC omeprazole 1 tablet daily

## 2013-07-13 ENCOUNTER — Other Ambulatory Visit: Payer: Self-pay | Admitting: *Deleted

## 2013-07-13 MED ORDER — METOPROLOL TARTRATE 50 MG PO TABS: ORAL_TABLET | ORAL | Status: DC

## 2013-07-13 MED ORDER — LOSARTAN POTASSIUM 100 MG PO TABS: ORAL_TABLET | ORAL | Status: DC

## 2013-07-13 MED ORDER — FENOFIBRATE 145 MG PO TABS: ORAL_TABLET | ORAL | Status: DC

## 2013-07-13 NOTE — Telephone Encounter (Signed)
Rx's sent to the pharmacy by e-script.//AB/CMA 

## 2013-10-09 ENCOUNTER — Other Ambulatory Visit: Payer: Self-pay | Admitting: Internal Medicine

## 2014-01-03 ENCOUNTER — Other Ambulatory Visit: Payer: Self-pay | Admitting: Internal Medicine

## 2014-01-11 ENCOUNTER — Other Ambulatory Visit: Payer: Self-pay | Admitting: Internal Medicine

## 2014-02-09 DIAGNOSIS — C4491 Basal cell carcinoma of skin, unspecified: Secondary | ICD-10-CM

## 2014-02-09 HISTORY — DX: Basal cell carcinoma of skin, unspecified: C44.91

## 2014-04-02 ENCOUNTER — Encounter: Payer: Self-pay | Admitting: Internal Medicine

## 2014-04-02 ENCOUNTER — Ambulatory Visit (INDEPENDENT_AMBULATORY_CARE_PROVIDER_SITE_OTHER): Payer: Medicare HMO | Admitting: Internal Medicine

## 2014-04-02 ENCOUNTER — Other Ambulatory Visit: Payer: Self-pay | Admitting: Internal Medicine

## 2014-04-02 VITALS — BP 130/71 | HR 57 | Temp 98.4°F | Ht 69.0 in | Wt 235.2 lb

## 2014-04-02 DIAGNOSIS — I10 Essential (primary) hypertension: Secondary | ICD-10-CM

## 2014-04-02 DIAGNOSIS — N528 Other male erectile dysfunction: Secondary | ICD-10-CM

## 2014-04-02 DIAGNOSIS — R399 Unspecified symptoms and signs involving the genitourinary system: Secondary | ICD-10-CM

## 2014-04-02 DIAGNOSIS — Z23 Encounter for immunization: Secondary | ICD-10-CM

## 2014-04-02 DIAGNOSIS — N529 Male erectile dysfunction, unspecified: Secondary | ICD-10-CM

## 2014-04-02 DIAGNOSIS — E785 Hyperlipidemia, unspecified: Secondary | ICD-10-CM

## 2014-04-02 DIAGNOSIS — Z0000 Encounter for general adult medical examination without abnormal findings: Secondary | ICD-10-CM

## 2014-04-02 NOTE — Assessment & Plan Note (Signed)
Currently well controlled but the patient feels there is a clear relationship between beta blockers and ED/decreased libido. Plan:  Continue with amlodipine and losartan Gradually discontinue beta blockers Increase amlodipine 5 mg to 2 tablets daily if needed for better BP control

## 2014-04-02 NOTE — Assessment & Plan Note (Signed)
Good compliance with fenofibrate, check labs

## 2014-04-02 NOTE — Progress Notes (Signed)
Subjective:    Patient ID: Jeffery Cline, male    DOB: 11-14-1945, 69 y.o.   MRN: [id number]  DOS:  04/02/2014 Type of visit - description :   Here for Medicare AWV:   1. Risk factors based on Past M, S, F history: reviewed   2. Physical Activities: more active, walks 2 miles most days   3. Depression/mood: (-) screening   4. Hearing: no problems noted or reported   5. ADL's: totally independent   6. Fall Risk: no recent falls, low risk, see instructions   7. home Safety: does feel safe at home   8. Height, weight, &visual acuity: see VS, normal vision, uses reading glasses, sees eye doctor, d/t early glaucoma   9. Counseling: provided   10. Labs ordered based on risk factors: if needed   11. Referral Coordination: if needed   12. Care Plan, see assessment and plan .Written plan provided 13. Cognitive Assessment: Motor exam and cognition are normal for age  51. Care team updated  15. End of life care , has a healthcare power of attorney document at home , asked for a copy   In addition, today we discussed the following: Hypertension, good compliance of medication, ambulatory BPs always within normal. Does have issue with beta blockers, see next. ED, decreased libido: Long history of ED, decreased libido. Thinks problem started after he started metoprolol, the days he forgets to take it, he noted some penile erections. High cholesterol, good compliance w/ medications without apparent side effects Medication list reviewed, not taking aspirin daily. LUTS: Still has problems with occasionally  urgency and frequency, + nocturia usually 3 times at night. No difficulty urinating, hematuria or dysuria..   Review of Systems Constitutional: No fever, chills. No unexplained wt changes. No unusual sweats HEENT: No dental problems, ear discharge, facial swelling, voice changes. No eye discharge, redness or intolerance to light Respiratory: No wheezing or difficulty breathing. No cough ,  mucus production Cardiovascular: No CP, leg swelling or palpitations GI: no nausea, vomiting, diarrhea or abdominal pain.  No blood in the stools. No dysphagia   Endocrine: No polyphagia, polyuria or polydipsia Musculoskeletal: No joint swellings or unusual aches or pains Skin: No change in the color of the skin, palor or rash Allergic, immunologic: No environmental allergies or food allergies Neurological: No dizziness or syncope. No headaches. No diplopia, slurred speech, motor deficits, facial numbness Hematological: No enlarged lymph nodes, easy bruising or bleeding Psychiatry: No suicidal ideas, hallucinations, behavior problems or confusion. No unusual/severe anxiety or depression.     Past Medical History  Diagnosis Date  . Hyperlipidemia   . Hypertension   . ED (erectile dysfunction)   . Allergic rhinitis   . GERD (gastroesophageal reflux disease)   . Decreased carotid pulse     decreased L carotid pulse (?) : Nl carotidu/s 02-2006      Past Surgical History  Procedure Laterality Date  . Pilonidal cyst excision    . Inguinal hernia repair      right by dr. Ninfa Linden  . Cystectomy      back  . Colonoscopy  01/09/2002    internal and external hemorrhoids  . Tonsillectomy      History   Social History  . Marital Status: Married    Spouse Name: N/A  . Number of Children: 2  . Years of Education: N/A   Occupational History  . retired    Social History Main Topics  . Smoking status: Never  Smoker   . Smokeless tobacco: Never Used  . Alcohol Use: Yes     Comment: less than 1 drink per day  . Drug Use: No  . Sexual Activity: Not on file   Other Topics Concern  . Not on file   Social History Narrative   Married. 2 sons ([place] and [place]) , 5 grandkids       Family History  Problem Relation Age of Onset  . Dementia Mother   . Coronary artery disease Father 2  . Coronary artery disease Maternal Grandfather   . Leukemia Paternal Grandfather   . Alcohol abuse  Mother   . Colon cancer Neg Hx   . Prostate cancer Neg Hx        Medication List       This list is accurate as of: 04/02/14 11:59 PM.  Always use your most recent med list.               amLODipine 5 MG tablet  Commonly known as:  NORVASC  Take 1 tablet daily.     aspirin 81 MG tablet  Take 81 mg by mouth daily.     azelastine 0.1 % nasal spray  Commonly known as:  ASTELIN  Place 2 sprays into both nostrils at bedtime.     bimatoprost 0.01 % Soln  Commonly known as:  LUMIGAN  1 drop at bedtime.     fenofibrate 145 MG tablet  Commonly known as:  TRICOR  TAKE 1 TABLET (145 MG TOTAL) BY MOUTH DAILY.     losartan 100 MG tablet  Commonly known as:  COZAAR  TAKE 1 TABLET (100 MG TOTAL) BY MOUTH DAILY.     metoprolol 50 MG tablet  Commonly known as:  LOPRESSOR  TAKE 1 TABLET (50 MG TOTAL) BY MOUTH 2 (TWO) TIMES DAILY.           Objective:   Physical Exam BP 130/71 mmHg  Pulse 57  Temp(Src) 98.4 F (36.9 C) (Oral)  Ht 5\' 9"  (1.753 m)  Wt 235 lb 4 oz (106.709 kg)  BMI 34.72 kg/m2  SpO2 97%  General:   Well developed, well nourished . NAD.  Neck:  Full range of motion. Supple. No  Thyromegaly . HEENT:  Normocephalic . Face symmetric, atraumatic Lungs:  CTA B Normal respiratory effort, no intercostal retractions, no accessory muscle use. Heart: brady,  no murmur.  Abdomen:  Not distended, soft, non-tender. No rebound or rigidity. No mass,organomegaly Muscle skeletal: no pretibial edema bilaterally  Rectal:  External abnormalities: none. Normal sphincter tone. No rectal masses or tenderness.  Stool brown  Prostate: Prostate gland firm and smooth, no enlargement, nodularity, tenderness, mass, asymmetry or induration.  Skin: Exposed areas without rash. Not pale. Not jaundice Neurologic:  alert & oriented X3.  Speech normal, gait appropriate for age and unassisted Strength symmetric and appropriate for age.  Psych: Cognition and judgment appear  intact.  Cooperative with normal attention span and concentration.  Behavior appropriate. No anxious or depressed appearing.     Assessment & Plan:

## 2014-04-02 NOTE — Patient Instructions (Signed)
Stop by the front desk and schedule labs to be done within few days (fasting)  Metoprolol 50 mg: Decrease to half tablet twice a day for 2 weeks Decreased to half tablet once a day for 2 weeks Then stop  Check your blood pressure daily, if he goes over 145/85, start taking amlodipine 5 mg 2 tablets daily     Please come back to the office in 2 months  For a check up   Fall Prevention and [place] cause injuries and can affect all age groups. It is possible to use preventive measures to significantly decrease the likelihood of falls. There are many simple measures which can make your home safer and prevent falls. OUTDOORS  Repair cracks and edges of walkways and driveways.  Remove high doorway thresholds.  Trim shrubbery on the main path into your home.  Have good outside lighting.  Clear walkways of tools, rocks, debris, and clutter.  Check that handrails are not broken and are securely fastened. Both sides of steps should have handrails.  Have leaves, snow, and ice cleared regularly.  Use sand or salt on walkways during winter months.  In the garage, clean up grease or oil spills. BATHROOM  Install night lights.  Install grab bars by the toilet and in the tub and shower.  Use non-skid mats or decals in the tub or shower.  Place a plastic non-slip stool in the shower to sit on, if needed.  Keep floors dry and clean up all water on the floor immediately.  Remove soap buildup in the tub or shower on a regular basis.  Secure bath mats with non-slip, double-sided rug tape.  Remove throw rugs and tripping hazards from the floors. BEDROOMS  Install night lights.  Make sure a bedside light is easy to reach.  Do not use oversized bedding.  Keep a telephone by your bedside.  Have a firm chair with side arms to use for getting dressed.  Remove throw rugs and tripping hazards from the floor. KITCHEN  Keep handles on pots and pans turned toward the  center of the stove. Use back burners when possible.  Clean up spills quickly and allow time for drying.  Avoid walking on wet floors.  Avoid hot utensils and knives.  Position shelves so they are not too high or low.  Place commonly used objects within easy reach.  If necessary, use a sturdy step stool with a grab bar when reaching.  Keep electrical cables out of the way.  Do not use floor polish or wax that makes floors slippery. If you must use wax, use non-skid floor wax.  Remove throw rugs and tripping hazards from the floor. STAIRWAYS  Never leave objects on stairs.  Place handrails on both sides of stairways and use them. Fix any loose handrails. Make sure handrails on both sides of the stairways are as long as the stairs.  Check carpeting to make sure it is firmly attached along stairs. Make repairs to worn or loose carpet promptly.  Avoid placing throw rugs at the top or bottom of stairways, or properly secure the rug with carpet tape to prevent slippage. Get rid of throw rugs, if possible.  Have an electrician put in a light switch at the top and bottom of the stairs. OTHER FALL PREVENTION TIPS  Wear low-heel or rubber-soled shoes that are supportive and fit well. Wear closed toe shoes.  When using a stepladder, make sure it is fully opened and both spreaders are  firmly locked. Do not climb a closed stepladder.  Add color or contrast paint or tape to grab bars and handrails in your home. Place contrasting color strips on first and last steps.  Learn and use mobility aids as needed. Install an electrical emergency response system.  Turn on lights to avoid dark areas. Replace light bulbs that burn out immediately. Get light switches that glow.  Arrange furniture to create clear pathways. Keep furniture in the same place.  Firmly attach carpet with non-skid or double-sided tape.  Eliminate uneven floor surfaces.  Select a carpet pattern that does not visually  hide the edge of steps.  Be aware of all pets. OTHER HOME SAFETY TIPS  Set the water temperature for 120 F (48.8 C).  Keep emergency numbers on or near the telephone.  Keep smoke detectors on every level of the home and near sleeping areas. Document Released: 01/16/2002 Document Revised: 07/28/2011 Document Reviewed: 04/17/2011 [facility] Patient Information 2015 [place], [place]. This information is not intended to replace advice given to you by your health care provider. Make sure you discuss any questions you have with your health care provider.   Preventive Care for Adults Ages 40 and over  Blood pressure check.** / Every 1 to 2 years.  Lipid and cholesterol check.**/ Every 5 years beginning at age 87.  Lung cancer screening. / Every year if you are aged 12-80 years and have a 30-pack-year history of smoking and currently smoke or have quit within the past 15 years. Yearly screening is stopped once you have quit smoking for at least 15 years or develop a health problem that would prevent you from having lung cancer treatment.  Fecal occult blood test (FOBT) of stool. / Every year beginning at age 37 and continuing until age 52. You may not have to do this test if you get a colonoscopy every 10 years.  Flexible sigmoidoscopy** or colonoscopy.** / Every 5 years for a flexible sigmoidoscopy or every 10 years for a colonoscopy beginning at age 57 and continuing until age 57.  Hepatitis C blood test.** / For all people born from [birth year] through 1965 and any individual with known risks for hepatitis C.  Abdominal aortic aneurysm (AAA) screening.** / A one-time screening for ages 57 to 64 years who are current or former smokers.  Skin self-exam. / Monthly.  Influenza vaccine. / Every year.  Tetanus, diphtheria, and acellular pertussis (Tdap/Td) vaccine.** / 1 dose of Td every 10 years.  Varicella vaccine.** / Consult your health care provider.  Zoster vaccine.** / 1 dose for adults  aged 66 years or older.  Pneumococcal 13-valent conjugate (PCV13) vaccine.** / Consult your health care provider.  Pneumococcal polysaccharide (PPSV23) vaccine.** / 1 dose for all adults aged 45 years and older.  Meningococcal vaccine.** / Consult your health care provider.  Hepatitis A vaccine.** / Consult your health care provider.  Hepatitis B vaccine.** / Consult your health care provider.  Haemophilus influenzae type b (Hib) vaccine.** / Consult your health care provider. **Family history and personal history of risk and conditions may change your health care provider's recommendations. Document Released: 03/24/2001 Document Revised: 01/31/2013 Document Reviewed: 06/23/2010 [facility] Patient Information 2015 [place], [place]. This information is not intended to replace advice given to you by your health care provider. Make sure you discuss any questions you have with your health care provider.

## 2014-04-02 NOTE — Assessment & Plan Note (Signed)
See comments under hypertension, we are stopping beta blockers. If no response, will get that testosterone level and possibly prescribe Viagra

## 2014-04-02 NOTE — Assessment & Plan Note (Addendum)
Td 09-2008 pneumonia 2012 Prevnar today Zostavax 02-2008 Had a flu shot already  Colonoscopy:   Date:  01/09/2002, normal Colonoscopy 04/2011, 2 polyps, Dr. Carlean Purl, next 2018.   Prostate cancer screening: DRE normal, checking a PSA today   diet-exercise discussed

## 2014-04-02 NOTE — Assessment & Plan Note (Signed)
Symptoms slightly worse lately? Check a PSA, UA

## 2014-04-02 NOTE — Progress Notes (Signed)
Pre visit review using our clinic review tool, if applicable. No additional management support is needed unless otherwise documented below in the visit note. 

## 2014-04-04 ENCOUNTER — Other Ambulatory Visit (INDEPENDENT_AMBULATORY_CARE_PROVIDER_SITE_OTHER): Payer: Medicare HMO

## 2014-04-04 DIAGNOSIS — R399 Unspecified symptoms and signs involving the genitourinary system: Secondary | ICD-10-CM

## 2014-04-04 DIAGNOSIS — N528 Other male erectile dysfunction: Secondary | ICD-10-CM

## 2014-04-04 DIAGNOSIS — N529 Male erectile dysfunction, unspecified: Secondary | ICD-10-CM

## 2014-04-04 DIAGNOSIS — E785 Hyperlipidemia, unspecified: Secondary | ICD-10-CM

## 2014-04-04 DIAGNOSIS — Z5181 Encounter for therapeutic drug level monitoring: Secondary | ICD-10-CM

## 2014-04-04 DIAGNOSIS — I10 Essential (primary) hypertension: Secondary | ICD-10-CM

## 2014-04-04 LAB — CBC WITH DIFFERENTIAL/PLATELET
Basophils Absolute: 0.1 10*3/uL (ref 0.0–0.1)
Basophils Relative: 0.5 % (ref 0.0–3.0)
Eosinophils Absolute: 0.6 10*3/uL (ref 0.0–0.7)
Eosinophils Relative: 5.6 % — ABNORMAL HIGH (ref 0.0–5.0)
HCT: 44.8 % (ref 39.0–52.0)
Hemoglobin: 15.1 g/dL (ref 13.0–17.0)
LYMPHS PCT: 25.4 % (ref 12.0–46.0)
Lymphs Abs: 2.5 10*3/uL (ref 0.7–4.0)
MCHC: 33.8 g/dL (ref 30.0–36.0)
MCV: 91 fl (ref 78.0–100.0)
Monocytes Absolute: 1.1 10*3/uL — ABNORMAL HIGH (ref 0.1–1.0)
Monocytes Relative: 10.9 % (ref 3.0–12.0)
Neutro Abs: 5.7 10*3/uL (ref 1.4–7.7)
Neutrophils Relative %: 57.6 % (ref 43.0–77.0)
PLATELETS: 205 10*3/uL (ref 150.0–400.0)
RBC: 4.92 Mil/uL (ref 4.22–5.81)
RDW: 13 % (ref 11.5–15.5)
WBC: 10 10*3/uL (ref 4.0–10.5)

## 2014-04-04 LAB — LIPID PANEL
CHOLESTEROL: 180 mg/dL (ref 0–200)
HDL: 31.6 mg/dL — ABNORMAL LOW (ref >39.00)
LDL CALC: 120 mg/dL — AB (ref 0–99)
NonHDL: 148.4
Total CHOL/HDL Ratio: 6
Triglycerides: 140 mg/dL (ref 0.0–149.0)
VLDL: 28 mg/dL (ref 0.0–40.0)

## 2014-04-04 LAB — BASIC METABOLIC PANEL
BUN: 19 mg/dL (ref 6–23)
CO2: 29 mEq/L (ref 19–32)
CREATININE: 0.94 mg/dL (ref 0.40–1.50)
Calcium: 9.4 mg/dL (ref 8.4–10.5)
Chloride: 106 mEq/L (ref 96–112)
GFR: 84.61 mL/min (ref >60.00)
GLUCOSE: 118 mg/dL — AB (ref 70–99)
Potassium: 4 mEq/L (ref 3.5–5.1)
Sodium: 140 mEq/L (ref 135–145)

## 2014-04-04 LAB — AST: AST: 17 U/L (ref 0–37)

## 2014-04-04 LAB — PSA: PSA: 1.48 ng/mL (ref 0.10–4.00)

## 2014-04-04 LAB — ALT: ALT: 24 U/L (ref 0–53)

## 2014-06-04 ENCOUNTER — Encounter: Payer: Self-pay | Admitting: Internal Medicine

## 2014-06-04 ENCOUNTER — Ambulatory Visit (INDEPENDENT_AMBULATORY_CARE_PROVIDER_SITE_OTHER): Payer: Medicare HMO | Admitting: Internal Medicine

## 2014-06-04 VITALS — BP 126/68 | HR 53 | Temp 97.5°F | Ht 69.0 in | Wt 234.0 lb

## 2014-06-04 DIAGNOSIS — N528 Other male erectile dysfunction: Secondary | ICD-10-CM | POA: Diagnosis not present

## 2014-06-04 DIAGNOSIS — R5383 Other fatigue: Secondary | ICD-10-CM | POA: Insufficient documentation

## 2014-06-04 DIAGNOSIS — R739 Hyperglycemia, unspecified: Secondary | ICD-10-CM | POA: Diagnosis not present

## 2014-06-04 DIAGNOSIS — I10 Essential (primary) hypertension: Secondary | ICD-10-CM

## 2014-06-04 DIAGNOSIS — E291 Testicular hypofunction: Secondary | ICD-10-CM

## 2014-06-04 DIAGNOSIS — R5382 Chronic fatigue, unspecified: Secondary | ICD-10-CM

## 2014-06-04 DIAGNOSIS — R399 Unspecified symptoms and signs involving the genitourinary system: Secondary | ICD-10-CM | POA: Diagnosis not present

## 2014-06-04 DIAGNOSIS — N529 Male erectile dysfunction, unspecified: Secondary | ICD-10-CM

## 2014-06-04 LAB — HEMOGLOBIN A1C: Hgb A1c MFr Bld: 6 % (ref 4.6–6.5)

## 2014-06-04 MED ORDER — LOSARTAN POTASSIUM 100 MG PO TABS: 100.0000 mg | ORAL_TABLET | Freq: Every day | ORAL | Status: DC

## 2014-06-04 MED ORDER — AMLODIPINE BESYLATE 5 MG PO TABS: 5.0000 mg | ORAL_TABLET | Freq: Every day | ORAL | Status: DC

## 2014-06-04 MED ORDER — METOPROLOL TARTRATE 50 MG PO TABS: 50.0000 mg | ORAL_TABLET | Freq: Two times a day (BID) | ORAL | Status: DC

## 2014-06-04 MED ORDER — FENOFIBRATE 145 MG PO TABS: 145.0000 mg | ORAL_TABLET | Freq: Every day | ORAL | Status: DC

## 2014-06-04 NOTE — Patient Instructions (Signed)
Get your blood work before you leave   Come back to the office in 4 months   for a routine check up   

## 2014-06-04 NOTE — Assessment & Plan Note (Signed)
Last PSA normal, has mostly nocturia, declined Flomax

## 2014-06-04 NOTE — Assessment & Plan Note (Signed)
Since the last visit he try to wean off beta blockers, BP went up, lower dose of BP did not change his ED. On further questioning, he has decreased libido, feeling tired, sometimes fatigue. Plan: Check a testosterone level. Declined Viagra.

## 2014-06-04 NOTE — Progress Notes (Signed)
Pre visit review using our clinic review tool, if applicable. No additional management support is needed unless otherwise documented below in the visit note. 

## 2014-06-04 NOTE — Assessment & Plan Note (Signed)
  Fatigue, Reports lack of energy, he does snore. Epworth Sleepiness Scale score 10 which is positive for sleep apnea. 10 years ago he had a negative sleep study, we discussed repeating a sleep study. We are also checking a testosterone level

## 2014-06-04 NOTE — Progress Notes (Signed)
Subjective:    Patient ID: Jeffery Cline, male    DOB: Aug 16, 1945, 69 y.o.   MRN: [id number]  DOS:  06/04/2014 Type of visit - description : f/u, to discuss several issues  Interval history: Since the last office visit, he try to wean off metoprolol to see if that help erectile dysfunction, when he was at the lowest dose of metoprolol, BP  was elevated, ED didn't improve and he felt slt "edgy". Did notice a + change--- slightly increase energy Eventually he decided to go back on normal dose of metoprolol because he was concerned about his blood pressure.     Review of Systems  denies weight increase or decrease   No anxiety- depression No blurred vision When asked, admits to a history of snoring or feeling sleepy some days. LUTS: Unchanged   Past Medical History  Diagnosis Date  . Hyperlipidemia   . Hypertension   . ED (erectile dysfunction)   . Allergic rhinitis   . GERD (gastroesophageal reflux disease)   . Decreased carotid pulse     decreased L carotid pulse (?) : Nl carotidu/s 02-2006      Past Surgical History  Procedure Laterality Date  . Pilonidal cyst excision    . Inguinal hernia repair      right by dr. Ninfa Linden  . Cystectomy      back  . Colonoscopy  01/09/2002    internal and external hemorrhoids  . Tonsillectomy      History   Social History  . Marital Status: Married    Spouse Name: N/A  . Number of Children: 2  . Years of Education: N/A   Occupational History  . retired    Social History Main Topics  . Smoking status: Never Smoker   . Smokeless tobacco: Never Used  . Alcohol Use: Yes     Comment: less than 1 drink per day  . Drug Use: No  . Sexual Activity: Not on file   Other Topics Concern  . Not on file   Social History Narrative   Married. 2 sons ([place] and [place]) , 5 grandkids          Medication List       This list is accurate as of: 06/04/14  1:02 PM.  Always use your most recent med list.               amLODipine  5 MG tablet  Commonly known as:  NORVASC  Take 1 tablet (5 mg total) by mouth daily.     aspirin 81 MG tablet  Take 81 mg by mouth daily.     azelastine 0.1 % nasal spray  Commonly known as:  ASTELIN  Place 2 sprays into both nostrils at bedtime.     bimatoprost 0.01 % Soln  Commonly known as:  LUMIGAN  1 drop at bedtime.     fenofibrate 145 MG tablet  Commonly known as:  TRICOR  Take 1 tablet (145 mg total) by mouth daily.     losartan 100 MG tablet  Commonly known as:  COZAAR  Take 1 tablet (100 mg total) by mouth daily.     metoprolol 50 MG tablet  Commonly known as:  LOPRESSOR  Take 1 tablet (50 mg total) by mouth 2 (two) times daily.           Objective:   Physical Exam BP 126/68 mmHg  Pulse 53  Temp(Src) 97.5 F (36.4 C) (Oral)  Ht 5\' 9"  (  1.753 m)  Wt 234 lb (106.142 kg)  BMI 34.54 kg/m2  SpO2 94% General:   Well developed, well nourished . NAD.  HEENT:  Normocephalic . Face symmetric, atraumatic Lungs:  CTA B Normal respiratory effort, no intercostal retractions, no accessory muscle use. Heart: RRR,  no murmur.  Muscle skeletal: Trace  pretibial edema bilaterally  Skin: Not pale. Not jaundice Neurologic:  alert & oriented X3.  Speech normal, gait appropriate for age and unassisted Psych--  Cognition and judgment appear intact.  Cooperative with normal attention span and concentration.  Behavior appropriate. No anxious or depressed appearing.        Assessment & Plan:       Hyperglycemia, check A1c   Today , I spent more than  25  min with the patient: >50% of the time counseling regards  pros and cons of checking a testosterone level and checking an sleep study

## 2014-06-04 NOTE — Assessment & Plan Note (Signed)
Since the last visit tried to wean off beta blockers but BP went up, back on normal dose of metoprolol and BPs well-controlled

## 2014-06-05 ENCOUNTER — Other Ambulatory Visit (INDEPENDENT_AMBULATORY_CARE_PROVIDER_SITE_OTHER): Payer: Medicare HMO

## 2014-06-05 DIAGNOSIS — E291 Testicular hypofunction: Secondary | ICD-10-CM

## 2014-06-05 LAB — TESTOSTERONE, FREE, TOTAL, SHBG
SEX HORMONE BINDING: 28 nmol/L (ref 22–77)
TESTOSTERONE-% FREE: 2.1 % (ref 1.6–2.9)
TESTOSTERONE: 180 ng/dL — AB (ref 300–890)
Testosterone, Free: 37.3 pg/mL — ABNORMAL LOW (ref 47.0–244.0)

## 2014-06-05 NOTE — Addendum Note (Signed)
Addended by: Janalee Dane C on: 06/05/2014 12:00 PM   Modules accepted: Orders

## 2014-06-06 LAB — PROLACTIN: PROLACTIN: 6 ng/mL (ref 2.1–17.1)

## 2014-06-06 LAB — FOLLICLE STIMULATING HORMONE: FSH: 4.8 m[IU]/mL (ref 1.4–18.1)

## 2014-06-06 LAB — LUTEINIZING HORMONE: LH: 2.47 m[IU]/mL

## 2014-06-11 NOTE — Addendum Note (Signed)
Addended by: Wilfrid Lund on: 06/11/2014 01:27 PM   Modules accepted: Orders

## 2014-06-20 ENCOUNTER — Ambulatory Visit (INDEPENDENT_AMBULATORY_CARE_PROVIDER_SITE_OTHER): Payer: Medicare HMO | Admitting: Internal Medicine

## 2014-06-20 ENCOUNTER — Encounter: Payer: Self-pay | Admitting: Internal Medicine

## 2014-06-20 VITALS — BP 118/70 | HR 58 | Temp 97.5°F | Resp 12 | Ht 70.0 in | Wt 238.6 lb

## 2014-06-20 DIAGNOSIS — R7989 Other specified abnormal findings of blood chemistry: Secondary | ICD-10-CM

## 2014-06-20 DIAGNOSIS — E291 Testicular hypofunction: Secondary | ICD-10-CM | POA: Diagnosis not present

## 2014-06-20 NOTE — Progress Notes (Signed)
Patient ID: Jeffery Cline, male   DOB: March 14, 1945, 69 y.o.   MRN: [id number]  HPI: Jeffery Cline is a 68 y.o.-year-old man, referred by his PCP, Dr. Larose Kells, for evaluation and management of low testosterone.  He c/o low libido, erectile dysfunction during APE with PCP >> testosterone was low.   Component     Latest Ref Rng 06/04/2014  Testosterone     300 - 890 ng/dL 180 (L)  Sex Hormone Binding     22 - 77 nmol/L 28  Testosterone Free     47.0 - 244.0 pg/mL 37.3 (L)  Testosterone-% Free     1.6 - 2.9 % 2.1  Labs were drawn at 9 am, fasting.  He admits for no libido - for the last 2 years + some fatigue; can fall asleep in am; has to be careful not to fall asleep at the wheel. Has difficulty obtaining or maintaining an erection - for the last 2 years; only occasional nocturnal erections. He also has nocturia almost every 2 hours No trauma to testes, testicular irradiation or surgery No h/o of mumps orchitis/h/o autoimmune ds. No h/o cryptorchidism He grew and went through puberty like his peers No shrinking of testes. No very small testes (<5 ml)    No breast discomfort/gynecomastia    No loss of body hair (axillary/pubic)/decreased need for shaving No height loss No abnormal sense of smell (only allergies) No hot flushes No vision problems (except glaucoma) No worst HA of his life - but had severe HAs 20 years ago when in [place] - when working.  No FH of hypogonadism/infertility  No personal h/o infertility - has 2 children (sons) No FH of hemochromatosis or pituitary tumors No excessive weight gain or loss.  No chronic diseases, exc. HTN No chronic pain. Not on opiates, does not take steroids.  No more than 2 drinks a day of alcohol at a time, and this is rarely No anabolic steroids use No herbal medicines  Not on antidepressants  No AI ds in his family, no FH of MS. He does not have family history of early cardiac disease (father at 21 y/o). His son and his  grandson: brittle bone ds >> improved after puberty.  He has oily skin >> gets cysts in the last 10 years; had cystic acne as a child. She is having them taken out - Dr Molli Posey. No change in shoe size, + change in ring sizes - adjusted 5-6 years ago. No underbite.    He can exercise and walks 3 miles a day 5x/a week.  Had DRE 03/2014 >> no BPH.  Reviewed pertinent labs: Component     Latest Ref Rng 03/01/2012 03/06/2013 04/04/2014 06/05/2014  TSH     0.35 - 5.50 uIU/mL 0.93     PSA     0.10 - 4.00 ng/mL 1.45 1.44 1.48   Prolactin     2.1 - 17.1 ng/mL    6.0  FSH     1.4 - 18.1 mIU/ML    4.8  LH         2.47   He used to drink alcohol, now he cut down significantly to avoid gaining weight.  ROS: Constitutional: no weight gain/loss, + fatigue, no subjective hyperthermia/hypothermia, + poor sleep, + excessive urination Eyes: + blurry vision, no xerophthalmia ENT: no sore throat, no nodules palpated in throat, no dysphagia/odynophagia, no hoarseness, + tinnitus Cardiovascular: no CP/SOB/palpitations/leg swelling Respiratory: + cough/no SOB Gastrointestinal: no N/V/D/C, + acid reflux Musculoskeletal:  no muscle/joint aches Skin: no rashes Neurological: no tremors/numbness/tingling/dizziness Psychiatric: no depression/anxiety + see HPI  Past Medical History  Diagnosis Date  . Hyperlipidemia   . Hypertension   . ED (erectile dysfunction)   . Allergic rhinitis   . GERD (gastroesophageal reflux disease)   . Decreased carotid pulse     decreased L carotid pulse (?) : Nl carotidu/s 02-2006     Past Surgical History  Procedure Laterality Date  . Pilonidal cyst excision    . Inguinal hernia repair      right by dr. Ninfa Linden  . Cystectomy      back  . Colonoscopy  01/09/2002    internal and external hemorrhoids  . Tonsillectomy     History   Social History  . Marital Status: Married    Spouse Name: N/A  . Number of Children: 2   Occupational History  . retired    Social  History Main Topics  . Smoking status: Never Smoker   . Smokeless tobacco: Never Used  . Alcohol Use: Yes     Comment: less than 1 drink per day  . Drug Use: No   Social History Narrative   Married. 2 sons ([place] and [place]) , 5 grandkids     Current Outpatient Prescriptions on File Prior to Visit  Medication Sig Dispense Refill  . amLODipine (NORVASC) 5 MG tablet Take 1 tablet (5 mg total) by mouth daily. 90 tablet 3  . aspirin 81 MG tablet Take 81 mg by mouth daily.      Marland Kitchen azelastine (ASTELIN) 137 MCG/SPRAY nasal spray Place 2 sprays into both nostrils at bedtime. 30 mL 3  . bimatoprost (LUMIGAN) 0.01 % SOLN 1 drop at bedtime.    . fenofibrate (TRICOR) 145 MG tablet Take 1 tablet (145 mg total) by mouth daily. 90 tablet 3  . losartan (COZAAR) 100 MG tablet Take 1 tablet (100 mg total) by mouth daily. 90 tablet 3  . metoprolol (LOPRESSOR) 50 MG tablet Take 1 tablet (50 mg total) by mouth 2 (two) times daily. 180 tablet 3   No current facility-administered medications on file prior to visit.   No Known Allergies Family History  Problem Relation Age of Onset  . Dementia Mother   . Coronary artery disease Father 50  . Coronary artery disease Maternal Grandfather   . Leukemia Paternal Grandfather   . Alcohol abuse Mother   . Colon cancer Neg Hx   . Prostate cancer Neg Hx    PE: BP 118/70 mmHg  Pulse 58  Temp(Src) 97.5 F (36.4 C) (Oral)  Resp 12  Ht 5\' 10"  (1.778 m)  Wt 238 lb 9.6 oz (108.228 kg)  BMI 34.24 kg/m2  SpO2 97% Wt Readings from Last 3 Encounters:  06/20/14 238 lb 9.6 oz (108.228 kg)  06/04/14 234 lb (106.142 kg)  04/02/14 235 lb 4 oz (106.709 kg)   Constitutional: overweight, in NAD Eyes: PERRLA, EOMI, no exophthalmos ENT: moist mucous membranes, no thyromegaly, no cervical lymphadenopathy Cardiovascular: RRR, No MRG Respiratory: CTA B Gastrointestinal: abdomen soft, NT, ND, BS+ Musculoskeletal: no deformities, strength intact in all 4 Skin: moist, warm, no  rashes; already scan, multiple small cysts on face Neurological: no tremor with outstretched hands, DTR normal in all 4 Genital exam: normal male escutcheon, no inguinal LAD, normal phallus, testes ~25 mL, no testicular masses, no penile discharge.  No gynecomastia. He has pseudo-gynecomastia due to fat accumulation behind the breast tissue.  ASSESSMENT: 1. Low testosterone  2.  ED  PLAN:  1. Low total testosterone I had a long discussion with the pt regarding his testosterone results, which we reviewed together. I explained that the total testosterone consists of free and bound testosterone. The total testosterone can be low: - later in the afternoon  - after meals  - when the testes do not secrete enough testosterone (in this case the free testosterone is low, too)   - when the SHBG is low (in this case the free testosterone is normal) In his case, his free testosterone is low, but not dramatically so. His total testosterone is significantly low, however this is mostly due to to the SHBG protein also being low. His LH and FSH were also lower than expected, confirming secondary hypogonadism. - We discussed about possible causes of secondary hypogonadism: Age Obesity (although usually testosterone is not severely low) Obstructive sleep apnea Pituitary tumors Hemochromatosis  Testicular tumors - Patient is obese, he will likely benefit from weight loss. However, he is complaining about sleepiness during the day to the point of falling asleep while reading in the morning on his porch, poor sleep at night, and he also has hypertension and occasional leg swelling. All of these can be signs of obstructive sleep apnea. We discussed that he will need a sleep study (he already discussed with PCP about this and I advised him to follow through with it), and I believe that his testosterone level will improve after starting to wear the CPAP machine. He is excited about this and would definitely want to  start this treatment if he is found to have OSA. We decided to check blood work for the above conditions, however, if all the rest of the tests are normal, to wait to see if his testosterone levels will improve on the CPAP machine before intervening and started him on testosterone replacement. He is not opposed to testosterone replacement, but he is not very enthusiastic about it, and would definitely want to try to correct the cause for his hypogonadism before treating the efect. - For now, will check the following tests (FSH, LH, prolactin recently checked, will only repeat LH to be done concomitantly with the testosterone): Orders Placed This Encounter  Procedures  . Testosterone, Free, Total, SHBG  . Estradiol  . B-HCG Quant  . Cortisol  . ACTH  . Insulin-like growth factor  . TSH  . T4, free  . T3, free  . LH  . IBC panel  - patient is leaving the country in 3 days, I encouraged him to have the labs before then  2. Erectile dysfunction - this is possibly caused by the decreased testosterone - I believe the above measures will improve his erections, but he can also try a PDE5 inhibitor. PCP already discussed with him about this. He did not try to get an is not eager to try it.  - time spent with the patient: 1 hour, of which >50% was spent in obtaining information about his symptoms, reviewing his previous labs, evaluations, and treatments, counseling him about his condition (please see the discussed topics above), and developing a plan to further investigate it; he had a number of questions which I addressed.  Component     Latest Ref Rng 06/21/2014  Testosterone     300 - 890 ng/dL 213 (L)  Sex Hormone Binding     22 - 77 nmol/L 30  Testosterone Free     47.0 - 244.0 pg/mL 43.0 (L)  Testosterone-% Free  1.6 - 2.9 % 2.0  Iron     42 - 165 ug/dL 6 (L)  Transferrin     212.0 - 360.0 mg/dL 280.0  Saturation Ratios     20.0 - 50.0 % 1.5 (L)  Estradiol, Free     ADULTS: < OR  = 0.45 pg/mL 0.52 (H)  Estradiol     ADULTS: < OR = 29 pg/mL 26  IGF-I, LC/MS     41 - 279 ng/mL 64  Z-Score (Male)     -2.0-+2.0 SD -1.1  TSH     0.35 - 4.50 uIU/mL 1.02  LH      2.61  Quantitative HCG      0.34  Cortisol, Plasma      8.6  C206 ACTH     6 - 50 pg/mL 12  Free T4     0.60 - 1.60 ng/dL 0.88  T3, Free     2.3 - 4.2 pg/mL 4.2   Cortisol, thyroid tests, IGF-I normal. Free Testosterone slightly low, with inappropriately normal LH and FSH in the context of low testosterone >> mild hypogonadotropic hypogonadism. Slightly high estradiol. Iron deficiency. I am not sure why he is iron deficient... This could be associated with low testosterone, but usually with much lower testosterone levels. He is not anemic, but low iron and low ferritin have been associated with fatigue. I'm not sure cure the had a colonoscopy, if not I would recommend one.  The testosterone is not low enough to trigger further investigation at this time, so we will continue with the plan to obtain a sleep study and possibly a CPAP machine before rechecking the testosterone. I will repeat an estradiol level at that time.

## 2014-06-20 NOTE — Patient Instructions (Signed)
Please come in for labs at 8 am, fasting.  Please come back for a follow-up appointment in 4 months.

## 2014-06-21 ENCOUNTER — Other Ambulatory Visit (INDEPENDENT_AMBULATORY_CARE_PROVIDER_SITE_OTHER): Payer: Medicare HMO

## 2014-06-21 DIAGNOSIS — E291 Testicular hypofunction: Secondary | ICD-10-CM

## 2014-06-21 DIAGNOSIS — R7989 Other specified abnormal findings of blood chemistry: Secondary | ICD-10-CM

## 2014-06-21 LAB — TSH: TSH: 1.02 u[IU]/mL (ref 0.35–4.50)

## 2014-06-21 LAB — T4, FREE: Free T4: 0.88 ng/dL (ref 0.60–1.60)

## 2014-06-21 LAB — LUTEINIZING HORMONE: LH: 2.61 m[IU]/mL

## 2014-06-21 LAB — HCG, QUANTITATIVE, PREGNANCY: Quantitative HCG: 0.34 m[IU]/mL

## 2014-06-21 LAB — T3, FREE: T3, Free: 4.2 pg/mL (ref 2.3–4.2)

## 2014-06-21 LAB — CORTISOL: CORTISOL PLASMA: 8.6 ug/dL

## 2014-06-21 LAB — IBC PANEL
Iron: 6 ug/dL — ABNORMAL LOW (ref 42–165)
SATURATION RATIOS: 1.5 % — AB (ref 20.0–50.0)
Transferrin: 280 mg/dL (ref 212.0–360.0)

## 2014-06-22 LAB — TESTOSTERONE, FREE, TOTAL, SHBG
Sex Hormone Binding: 30 nmol/L (ref 22–77)
TESTOSTERONE: 213 ng/dL — AB (ref 300–890)
Testosterone, Free: 43 pg/mL — ABNORMAL LOW (ref 47.0–244.0)
Testosterone-% Free: 2 % (ref 1.6–2.9)

## 2014-06-25 LAB — ACTH: C206 ACTH: 12 pg/mL (ref 6–50)

## 2014-06-26 LAB — INSULIN-LIKE GROWTH FACTOR
IGF-I, LC/MS: 64 ng/mL (ref 41–279)
Z-SCORE (MALE): -1.1 {STDV} (ref ?–2.0)

## 2014-06-27 LAB — ESTRADIOL, FREE
ESTRADIOL: 26 pg/mL (ref <=29)
Estradiol, Free: 0.52 pg/mL — ABNORMAL HIGH (ref <=0.45)

## 2014-07-18 DIAGNOSIS — G4730 Sleep apnea, unspecified: Secondary | ICD-10-CM | POA: Diagnosis not present

## 2014-07-20 DIAGNOSIS — G4730 Sleep apnea, unspecified: Secondary | ICD-10-CM | POA: Diagnosis not present

## 2014-07-25 ENCOUNTER — Other Ambulatory Visit: Payer: Self-pay | Admitting: *Deleted

## 2014-07-25 DIAGNOSIS — R5382 Chronic fatigue, unspecified: Secondary | ICD-10-CM

## 2014-07-26 ENCOUNTER — Encounter: Payer: Self-pay | Admitting: Internal Medicine

## 2014-07-26 ENCOUNTER — Telehealth: Payer: Self-pay | Admitting: Internal Medicine

## 2014-07-26 DIAGNOSIS — G4730 Sleep apnea, unspecified: Secondary | ICD-10-CM

## 2014-07-26 NOTE — Telephone Encounter (Signed)
Advise patient,  sleep study was + for sleep apnea Please arrange a referral to Dr. Halford Chessman [facility]  for further treatment

## 2014-07-26 NOTE — Telephone Encounter (Signed)
Spoke with Pt, informed him of sleep study results. Informed him that we are referring him to Dr. Halford Chessman at pulmonary for further treatment. Pt verbalized understanding. Referral placed to Dr. Halford Chessman.

## 2014-09-06 ENCOUNTER — Institutional Professional Consult (permissible substitution): Payer: Medicare HMO | Admitting: Pulmonary Disease

## 2014-10-22 ENCOUNTER — Ambulatory Visit: Payer: Medicare HMO | Admitting: Internal Medicine

## 2014-11-26 ENCOUNTER — Institutional Professional Consult (permissible substitution): Payer: Medicare HMO | Admitting: Internal Medicine

## 2014-11-27 ENCOUNTER — Emergency Department (HOSPITAL_BASED_OUTPATIENT_CLINIC_OR_DEPARTMENT_OTHER)
Admission: EM | Admit: 2014-11-27 | Discharge: 2014-11-27 | Disposition: A | Discharge status: Home / Self Care | Payer: Medicare HMO | Attending: Emergency Medicine | Admitting: Emergency Medicine

## 2014-11-27 ENCOUNTER — Emergency Department (HOSPITAL_BASED_OUTPATIENT_CLINIC_OR_DEPARTMENT_OTHER): Payer: Medicare HMO

## 2014-11-27 ENCOUNTER — Encounter (HOSPITAL_BASED_OUTPATIENT_CLINIC_OR_DEPARTMENT_OTHER): Payer: Self-pay | Admitting: *Deleted

## 2014-11-27 DIAGNOSIS — Z87438 Personal history of other diseases of male genital organs: Secondary | ICD-10-CM | POA: Insufficient documentation

## 2014-11-27 DIAGNOSIS — Z7982 Long term (current) use of aspirin: Secondary | ICD-10-CM | POA: Insufficient documentation

## 2014-11-27 DIAGNOSIS — Z79899 Other long term (current) drug therapy: Secondary | ICD-10-CM | POA: Diagnosis not present

## 2014-11-27 DIAGNOSIS — Z8669 Personal history of other diseases of the nervous system and sense organs: Secondary | ICD-10-CM | POA: Diagnosis not present

## 2014-11-27 DIAGNOSIS — H538 Other visual disturbances: Secondary | ICD-10-CM | POA: Diagnosis not present

## 2014-11-27 DIAGNOSIS — I10 Essential (primary) hypertension: Secondary | ICD-10-CM | POA: Diagnosis not present

## 2014-11-27 DIAGNOSIS — K219 Gastro-esophageal reflux disease without esophagitis: Secondary | ICD-10-CM | POA: Diagnosis not present

## 2014-11-27 DIAGNOSIS — R6883 Chills (without fever): Secondary | ICD-10-CM | POA: Diagnosis not present

## 2014-11-27 DIAGNOSIS — E785 Hyperlipidemia, unspecified: Secondary | ICD-10-CM | POA: Diagnosis not present

## 2014-11-27 DIAGNOSIS — R110 Nausea: Secondary | ICD-10-CM | POA: Diagnosis present

## 2014-11-27 DIAGNOSIS — R531 Weakness: Secondary | ICD-10-CM | POA: Diagnosis not present

## 2014-11-27 HISTORY — DX: Sleep apnea, unspecified: G47.30

## 2014-11-27 LAB — COMPREHENSIVE METABOLIC PANEL
ALT: 40 U/L (ref 17–63)
AST: 28 U/L (ref 15–41)
Albumin: 4.8 g/dL (ref 3.5–5.0)
Alkaline Phosphatase: 46 U/L (ref 38–126)
Anion gap: 9 (ref 5–15)
BUN: 16 mg/dL (ref 6–20)
CHLORIDE: 102 mmol/L (ref 101–111)
CO2: 27 mmol/L (ref 22–32)
CREATININE: 1.08 mg/dL (ref 0.61–1.24)
Calcium: 9.3 mg/dL (ref 8.9–10.3)
GFR calc non Af Amer: >60 mL/min (ref >60)
Glucose, Bld: 103 mg/dL — ABNORMAL HIGH (ref 65–99)
POTASSIUM: 3.7 mmol/L (ref 3.5–5.1)
SODIUM: 138 mmol/L (ref 135–145)
Total Bilirubin: 0.8 mg/dL (ref 0.3–1.2)
Total Protein: 8 g/dL (ref 6.5–8.1)

## 2014-11-27 LAB — CBC WITH DIFFERENTIAL/PLATELET
Basophils Absolute: 0 10*3/uL (ref 0.0–0.1)
Basophils Relative: 0 %
EOS ABS: 0.8 10*3/uL — AB (ref 0.0–0.7)
Eosinophils Relative: 8 %
HCT: 46.3 % (ref 39.0–52.0)
Hemoglobin: 16 g/dL (ref 13.0–17.0)
Lymphocytes Relative: 25 %
Lymphs Abs: 2.6 10*3/uL (ref 0.7–4.0)
MCH: 31 pg (ref 26.0–34.0)
MCHC: 34.6 g/dL (ref 30.0–36.0)
MCV: 89.7 fL (ref 78.0–100.0)
MONO ABS: 0.7 10*3/uL (ref 0.1–1.0)
Monocytes Relative: 7 %
NEUTROS PCT: 60 %
Neutro Abs: 6.4 10*3/uL (ref 1.7–7.7)
PLATELETS: 220 10*3/uL (ref 150–400)
RBC: 5.16 MIL/uL (ref 4.22–5.81)
RDW: 12.9 % (ref 11.5–15.5)
WBC: 10.5 10*3/uL (ref 4.0–10.5)

## 2014-11-27 LAB — TROPONIN I: Troponin I: <0.03 ng/mL (ref <0.031)

## 2014-11-27 MED ORDER — PANTOPRAZOLE SODIUM 40 MG IV SOLR
40.0000 mg | Freq: Once | INTRAVENOUS | Status: AC
  Administered 2014-11-27: 40 mg via INTRAVENOUS
  Filled 2014-11-27: qty 40

## 2014-11-27 MED ORDER — SODIUM CHLORIDE 0.9 % IV BOLUS (SEPSIS)
500.0000 mL | Freq: Once | INTRAVENOUS | Status: AC
  Administered 2014-11-27: 500 mL via INTRAVENOUS

## 2014-11-27 NOTE — ED Notes (Signed)
Patient states he developed a sudden onset of clamy feeling yesterday which has continued today.  States he feels weird and is associated with nausea and sob with exertion.

## 2014-11-27 NOTE — ED Notes (Signed)
Dr. Beaton in room with patient now. 

## 2014-11-27 NOTE — ED Provider Notes (Signed)
CSN: [id number]     Arrival date & time 11/27/14  1801 History  By signing my name below, I, Tula Nakayama, attest that this documentation has been prepared under the direction and in the presence of Leonard Schwartz, MD.  Electronically Signed: Tula Nakayama, ED Scribe. 11/27/2014. 7:16 PM.   Chief Complaint  Patient presents with  . Nausea   The history is provided by the patient. No language interpreter was used.    HPI Comments: Jeffery Cline is a 69 y.o. male with a history of HTN who presents to the Emergency Department complaining of intermittent, moderate clammy chills and nausea that started 2 days ago. Pt states blurred vision and decreased appetite as associated symptoms. He also reports gradually improving, mild nasal congestion and dry cough over the last few days. Pt was taking OTC cold medication and GERD medication for cough, but last took treatments a few days ago. He is also on metoprolol, amlodipine and losartan for HTN and has been compliant with medication. Pt measured his BP PTA which was 180/110, which is abnormal for him. He denies CP, vomiting and diarrhea.  Past Medical History  Diagnosis Date  . Hyperlipidemia   . Hypertension   . ED (erectile dysfunction)   . Allergic rhinitis   . GERD (gastroesophageal reflux disease)   . Decreased carotid pulse     decreased L carotid pulse (?) : Nl carotidu/s 02-2006    . Sleep apnea    Past Surgical History  Procedure Laterality Date  . Pilonidal cyst excision    . Inguinal hernia repair      right by dr. Ninfa Linden  . Cystectomy      back  . Colonoscopy  01/09/2002    internal and external hemorrhoids  . Tonsillectomy     Family History  Problem Relation Age of Onset  . Dementia Mother   . Coronary artery disease Father 63  . Coronary artery disease Maternal Grandfather   . Leukemia Paternal Grandfather   . Alcohol abuse Mother   . Colon cancer Neg Hx   . Prostate cancer Neg Hx    Social History   Substance Use Topics  . Smoking status: Never Smoker   . Smokeless tobacco: Never Used  . Alcohol Use: Yes     Comment: less than 1 drink per day   Review of Systems  Constitutional: Positive for appetite change.  Eyes: Positive for visual disturbance.  Cardiovascular: Negative for chest pain.  Gastrointestinal: Positive for nausea. Negative for vomiting and diarrhea.  All other systems reviewed and are negative.  Allergies  Review of patient's allergies indicates no known allergies.  Home Medications   Prior to Admission medications   Medication Sig Start Date End Date Taking? Authorizing Provider  amLODipine (NORVASC) 5 MG tablet Take 1 tablet (5 mg total) by mouth daily. 06/04/14  Yes Colon Branch, MD  aspirin 81 MG tablet Take 81 mg by mouth daily.     Yes Historical Provider, MD  bimatoprost (LUMIGAN) 0.01 % SOLN 1 drop at bedtime.   Yes Historical Provider, MD  fenofibrate (TRICOR) 145 MG tablet Take 1 tablet (145 mg total) by mouth daily. 06/04/14  Yes Colon Branch, MD  losartan (COZAAR) 100 MG tablet Take 1 tablet (100 mg total) by mouth daily. 06/04/14  Yes Colon Branch, MD  metoprolol (LOPRESSOR) 50 MG tablet Take 1 tablet (50 mg total) by mouth 2 (two) times daily. 06/04/14  Yes Colon Branch, MD  azelastine (ASTELIN) 137 MCG/SPRAY nasal spray Place 2 sprays into both nostrils at bedtime. 04/11/13   Colon Branch, MD   BP 153/75 mmHg  Pulse 59  Resp 18  SpO2 98% Physical Exam Physical Exam  Nursing note and vitals reviewed. Constitutional: He is oriented to person, place, and time. He appears well-developed and well-nourished. No distress.  HENT:  Head: Normocephalic and atraumatic.  Eyes: Pupils are equal, round, and reactive to light.  Neck: Normal range of motion.  Cardiovascular: Normal rate and intact distal pulses.   Pulmonary/Chest: No respiratory distress.  Abdominal: Normal appearance. He exhibits no distension.  Musculoskeletal: Normal range of motion.  Neurological:  He is alert and oriented to person, place, and time. No cranial nerve deficit.  Skin: Skin is warm and dry. No rash noted.  Psychiatric: He has a normal mood and affect. His behavior is normal.   ED Course  Procedures   DIAGNOSTIC STUDIES: Oxygen Saturation is 97% on RA, normal by my interpretation.    COORDINATION OF CARE: 7:08 PM Discussed treatment plan with pt which includes lab work and monitoring BP in the ED. Pt agreed to plan.  Medications  sodium chloride 0.9 % bolus 500 mL (500 mLs Intravenous New Bag/Given 11/27/14 1948)  pantoprazole (PROTONIX) injection 40 mg (40 mg Intravenous Given 11/27/14 1948)    Labs Review Labs Reviewed  CBC WITH DIFFERENTIAL/PLATELET - Abnormal; Notable for the following:    Eosinophils Absolute 0.8 (*)    All other components within normal limits  COMPREHENSIVE METABOLIC PANEL - Abnormal; Notable for the following:    Glucose, Bld 103 (*)    All other components within normal limits  TROPONIN I    Imaging Review Dg Chest 2 View  11/27/2014  CLINICAL DATA:  Chills and weakness EXAM: CHEST - 2 VIEW COMPARISON:  08/21/2010 FINDINGS: Cardiac shadow is within normal limits. Lungs are well aerated bilaterally. No focal infiltrate or sizable effusion is seen. Degenerative changes of the thoracic spine are noted. IMPRESSION: No acute abnormality seen. Electronically Signed   By: Inez Catalina M.D.   On: 11/27/2014 18:47   I have personally reviewed and evaluated these images and lab results as part of my medical decision-making.   EKG Interpretation   Date/Time:  Tuesday November 27 2014 18:18:57 EDT Ventricular Rate:  63 PR Interval:  180 QRS Duration: 104 QT Interval:  436 QTC Calculation: 446 R Axis:   106 Text Interpretation:  Normal sinus rhythm Possible Right ventricular  hypertrophy Abnormal ECG No significant change since last tracing  Confirmed by Colwyn ([id number]) on 11/27/2014 6:20:51 PM     After treatment in the ED  the patient feels back to baseline and wants to go home. MDM   Final diagnoses:  Gastroesophageal reflux disease, esophagitis presence not specified   I personally performed the services described in this documentation, which was scribed in my presence. The recorded information has been reviewed and considered.    Leonard Schwartz, MD 11/27/14 2030

## 2014-11-27 NOTE — ED Notes (Signed)
Pt states sudden onset of "clammy cold sweat with nausea" that comes and goes for past 2 days, along with intermittent blurred vision that has since resolved.  Pt states he "just doesn't feel right."

## 2014-11-27 NOTE — Discharge Instructions (Signed)
Food Choices for Gastroesophageal Reflux Disease, Adult °When you have gastroesophageal reflux disease (GERD), the foods you eat and your eating habits are very important. Choosing the right foods can help ease the discomfort of GERD. °WHAT GENERAL GUIDELINES DO I NEED TO FOLLOW? °· Choose fruits, vegetables, whole grains, low-fat dairy products, and low-fat meat, fish, and poultry. °· Limit fats such as oils, salad dressings, butter, nuts, and avocado. °· Keep a food diary to identify foods that cause symptoms. °· Avoid foods that cause reflux. These may be different for different people. °· Eat frequent small meals instead of three large meals each day. °· Eat your meals slowly, in a relaxed setting. °· Limit fried foods. °· Cook foods using methods other than frying. °· Avoid drinking alcohol. °· Avoid drinking large amounts of liquids with your meals. °· Avoid bending over or lying down until 2-3 hours after eating. °WHAT FOODS ARE NOT RECOMMENDED? °The following are some foods and drinks that may worsen your symptoms: °Vegetables °Tomatoes. Tomato juice. Tomato and spaghetti sauce. Chili peppers. Onion and garlic. Horseradish. °Fruits °Oranges, grapefruit, and lemon (fruit and juice). °Meats °High-fat meats, fish, and poultry. This includes hot dogs, ribs, ham, sausage, salami, and bacon. °Dairy °Whole milk and chocolate milk. Sour cream. Cream. Butter. Ice cream. Cream cheese.  °Beverages °Coffee and tea, with or without caffeine. Carbonated beverages or energy drinks. °Condiments °Hot sauce. Barbecue sauce.  °Sweets/Desserts °Chocolate and cocoa. Donuts. Peppermint and spearmint. °Fats and Oils °High-fat foods, including French fries and potato chips. °Other °Vinegar. Strong spices, such as black pepper, white pepper, red pepper, cayenne, curry powder, cloves, ginger, and chili powder. °The items listed above may not be a complete list of foods and beverages to avoid. Contact your dietitian for more  information. °  °This information is not intended to replace advice given to you by your health care provider. Make sure you discuss any questions you have with your health care provider. °  °Document Released: 01/26/2005 Document Revised: 02/16/2014 Document Reviewed: 11/30/2012 °Elsevier Interactive Patient Education ©2016 [employer] ° °Gastroesophageal Reflux Disease, Adult °Normally, food travels down the esophagus and stays in the stomach to be digested. However, when a person has gastroesophageal reflux disease (GERD), food and stomach acid move back up into the esophagus. When this happens, the esophagus becomes sore and inflamed. Over time, GERD can create small holes (ulcers) in the lining of the esophagus.  °CAUSES °This condition is caused by a problem with the muscle between the esophagus and the stomach (lower esophageal sphincter, or LES). Normally, the LES muscle closes after food passes through the esophagus to the stomach. When the LES is weakened or abnormal, it does not close properly, and that allows food and stomach acid to go back up into the esophagus. The LES can be weakened by certain dietary substances, medicines, and medical conditions, including: °· Tobacco use. °· Pregnancy. °· Having a hiatal hernia. °· Heavy alcohol use. °· Certain foods and beverages, such as coffee, chocolate, onions, and peppermint. °RISK FACTORS °This condition is more likely to develop in: °· People who have an increased body weight. °· People who have connective tissue disorders. °· People who use NSAID medicines. °SYMPTOMS °Symptoms of this condition include: °· Heartburn. °· Difficult or painful swallowing. °· The feeling of having a lump in the throat. °· A bitter taste in the mouth. °· Bad breath. °· Having a large amount of saliva. °· Having an upset or bloated stomach. °· Belching. °· Chest pain. °·   Shortness of breath or wheezing. °· Ongoing (chronic) cough or a night-time cough. °· Wearing away of  tooth enamel. °· Weight loss. °Different conditions can cause chest pain. Make sure to see your health care provider if you experience chest pain. °DIAGNOSIS °Your health care provider will take a medical history and perform a physical exam. To determine if you have mild or severe GERD, your health care provider may also monitor how you respond to treatment. You may also have other tests, including: °· An endoscopy to examine your stomach and esophagus with a small camera. °· A test that measures the acidity level in your esophagus. °· A test that measures how much pressure is on your esophagus. °· A barium swallow or modified barium swallow to show the shape, size, and functioning of your esophagus. °TREATMENT °The goal of treatment is to help relieve your symptoms and to prevent complications. Treatment for this condition may vary depending on how severe your symptoms are. Your health care provider may recommend: °· Changes to your diet. °· Medicine. °· Surgery. °HOME CARE INSTRUCTIONS °Diet °· Follow a diet as recommended by your health care provider. This may involve avoiding foods and drinks such as: °¨ Coffee and tea (with or without caffeine). °¨ Drinks that contain alcohol. °¨ Energy drinks and sports drinks. °¨ Carbonated drinks or sodas. °¨ Chocolate and cocoa. °¨ Peppermint and mint flavorings. °¨ Garlic and onions. °¨ Horseradish. °¨ Spicy and acidic foods, including peppers, chili powder, curry powder, vinegar, hot sauces, and barbecue sauce. °¨ Citrus fruit juices and citrus fruits, such as oranges, lemons, and limes. °¨ Tomato-based foods, such as red sauce, chili, salsa, and pizza with red sauce. °¨ Fried and fatty foods, such as donuts, french fries, potato chips, and high-fat dressings. °¨ High-fat meats, such as hot dogs and fatty cuts of red and white meats, such as rib eye steak, sausage, ham, and bacon. °¨ High-fat dairy items, such as whole milk, butter, and cream cheese. °· Eat small,  frequent meals instead of large meals. °· Avoid drinking large amounts of liquid with your meals. °· Avoid eating meals during the 2-3 hours before bedtime. °· Avoid lying down right after you eat. °· Do not exercise right after you eat. ° General Instructions  °· Pay attention to any changes in your symptoms. °· Take over-the-counter and prescription medicines only as told by your health care provider. Do not take aspirin, ibuprofen, or other NSAIDs unless your health care provider told you to do so. °· Do not use any tobacco products, including cigarettes, chewing tobacco, and e-cigarettes. If you need help quitting, ask your health care provider. °· Wear loose-fitting clothing. Do not wear anything tight around your waist that causes pressure on your abdomen. °· Raise (elevate) the head of your bed 6 inches (15cm). °· Try to reduce your stress, such as with yoga or meditation. If you need help reducing stress, ask your health care provider. °· If you are overweight, reduce your weight to an amount that is healthy for you. Ask your health care provider for guidance about a safe weight loss goal. °· Keep all follow-up visits as told by your health care provider. This is important. °SEEK MEDICAL CARE IF: °· You have new symptoms. °· You have unexplained weight loss. °· You have difficulty swallowing, or it hurts to swallow. °· You have wheezing or a persistent cough. °· Your symptoms do not improve with treatment. °· You have a hoarse voice. °SEEK IMMEDIATE MEDICAL CARE IF: °· You have pain   in your arms, neck, jaw, teeth, or back.  You feel sweaty, dizzy, or light-headed.  You have chest pain or shortness of breath.  You vomit and your vomit looks like blood or coffee grounds.  You faint.  Your stool is bloody or black.  You cannot swallow, drink, or eat.   This information is not intended to replace advice given to you by your health care provider. Make sure you discuss any questions you have with  your health care provider.   Document Released: 11/05/2004 Document Revised: 10/17/2014 Document Reviewed: 05/23/2014 Elsevier Interactive Patient Education [employer].

## 2014-11-29 ENCOUNTER — Institutional Professional Consult (permissible substitution): Payer: Medicare HMO | Admitting: Pulmonary Disease

## 2014-12-04 DIAGNOSIS — D485 Neoplasm of uncertain behavior of skin: Secondary | ICD-10-CM | POA: Diagnosis not present

## 2014-12-04 DIAGNOSIS — L738 Other specified follicular disorders: Secondary | ICD-10-CM | POA: Diagnosis not present

## 2014-12-04 DIAGNOSIS — L814 Other melanin hyperpigmentation: Secondary | ICD-10-CM | POA: Diagnosis not present

## 2014-12-04 DIAGNOSIS — C44612 Basal cell carcinoma of skin of right upper limb, including shoulder: Secondary | ICD-10-CM | POA: Diagnosis not present

## 2014-12-04 DIAGNOSIS — L821 Other seborrheic keratosis: Secondary | ICD-10-CM | POA: Diagnosis not present

## 2014-12-04 DIAGNOSIS — C44519 Basal cell carcinoma of skin of other part of trunk: Secondary | ICD-10-CM | POA: Diagnosis not present

## 2014-12-04 DIAGNOSIS — L720 Epidermal cyst: Secondary | ICD-10-CM | POA: Diagnosis not present

## 2014-12-04 DIAGNOSIS — D1801 Hemangioma of skin and subcutaneous tissue: Secondary | ICD-10-CM | POA: Diagnosis not present

## 2014-12-06 ENCOUNTER — Encounter: Payer: Self-pay | Admitting: Pulmonary Disease

## 2014-12-06 ENCOUNTER — Ambulatory Visit (INDEPENDENT_AMBULATORY_CARE_PROVIDER_SITE_OTHER): Payer: Medicare HMO | Admitting: Pulmonary Disease

## 2014-12-06 VITALS — BP 159/77 | HR 56 | Temp 97.3°F | Ht 70.0 in | Wt 244.0 lb

## 2014-12-06 DIAGNOSIS — Z23 Encounter for immunization: Secondary | ICD-10-CM

## 2014-12-06 DIAGNOSIS — G4733 Obstructive sleep apnea (adult) (pediatric): Secondary | ICD-10-CM | POA: Diagnosis not present

## 2014-12-06 NOTE — Patient Instructions (Signed)
You have moderate degree of obstructive sleep apnea - 27 events/ hour on your home study CPAP titration study based on this, we will set you up with a CPAP machine

## 2014-12-06 NOTE — Assessment & Plan Note (Signed)
You have moderate degree of obstructive sleep apnea - 27 events/ hour on your home study CPAP titration study based on this, we will set you up with a CPAP machine   The pathophysiology of obstructive sleep apnea , it's cardiovascular consequences & modes of treatment including CPAP were discused with the patient in detail & they evidenced understanding.  Weight loss encouraged, compliance with goal of at least 4-6 hrs every night is the expectation. Advised against medications with sedative side effects Cautioned against driving when sleepy - understanding that sleepiness will vary on a day to day basis

## 2014-12-06 NOTE — Progress Notes (Signed)
Subjective:    Patient ID: Jeffery Cline, male    DOB: October 06, 1945, 69 y.o.   MRN: [id number]  HPI  Chief Complaint  Patient presents with  . Sleep Consult    Referred by Dr. Larose Kells; had sleep study 10 years ago, negative for OSA; had HST done 07/18/14.  Epworth Score: 5  Flu shot   69 year old, overweight hypertensive presents for evaluation of OSA. He reports nocturia and erectile dysfunction, was evaluated by endocrinology for low testosterone and felt that his symptoms could be attributable to OSA. He had a sleep study done 10 years ago which was reportedly negative. He has gained about 40 pounds since then Home sleep study 07/2014 showed moderate OSA with AHI 27/hour and lowest desaturation of 79%. Total sleep time was 8 hours. Epworth sleepiness score is 5 and he reports excessive daytime fatigue. He leads a retired lifestyle. Bedtime is around 10 PM, sleep latency is minimal, he falls asleep on his back but often rolls over on his side, uses 2 pillows, reports 4-5 nocturnal awakenings for nocturia and is out of bed by 7 AM with dryness of mouth and occasional headache.  There is no history suggestive of cataplexy, sleep paralysis or parasomnias He reports breakthrough GERD symptoms causing wheezing and cough He is a lifetime never smoker    Past Medical History  Diagnosis Date  . Hyperlipidemia   . Hypertension   . ED (erectile dysfunction)   . Allergic rhinitis   . GERD (gastroesophageal reflux disease)   . Decreased carotid pulse     decreased L carotid pulse (?) : Nl carotidu/s 02-2006    . Sleep apnea     Past Surgical History  Procedure Laterality Date  . Pilonidal cyst excision    . Inguinal hernia repair      right by dr. Ninfa Linden  . Cystectomy      back  . Colonoscopy  01/09/2002    internal and external hemorrhoids  . Tonsillectomy       No Known Allergies  Social History   Social History  . Marital Status: Married    Spouse Name: N/A  . Number of  Children: 2  . Years of Education: N/A   Occupational History  . retired    Social History Main Topics  . Smoking status: Never Smoker   . Smokeless tobacco: Never Used  . Alcohol Use: Yes     Comment: less than 1 drink per day  . Drug Use: No  . Sexual Activity: Not on file   Other Topics Concern  . Not on file   Social History Narrative   Married. 2 sons ([place] and [place]) , 5 grandkids      Family History  Problem Relation Age of Onset  . Dementia Mother   . Coronary artery disease Father 22  . Coronary artery disease Maternal Grandfather   . Leukemia Paternal Grandfather   . Alcohol abuse Mother   . Colon cancer Neg Hx   . Prostate cancer Neg Hx      Review of Systems  neg for any significant sore throat, dysphagia, itching, sneezing, nasal congestion or excess/ purulent secretions, fever, chills, sweats, unintended wt loss, pleuritic or exertional cp, hempoptysis, orthopnea pnd or change in chronic leg swelling. Also denies presyncope, palpitations, heartburn, abdominal pain, nausea, vomiting, diarrhea or change in bowel or urinary habits, dysuria,hematuria, rash, arthralgias, visual complaints, headache, numbness weakness or ataxia.     Objective:   Physical Exam  Gen. Pleasant, well-nourished, in no distress, normal affect ENT - no lesions, no post nasal drip Neck: No JVD, no thyromegaly, no carotid bruits Lungs: no use of accessory muscles, no dullness to percussion, clear without rales or rhonchi  Cardiovascular: Rhythm regular, heart sounds  normal, no murmurs or gallops, no peripheral edema Abdomen: soft and non-tender, no hepatosplenomegaly, BS normal. Musculoskeletal: No deformities, no cyanosis or clubbing Neuro:  alert, non focal        Assessment & Plan:

## 2014-12-18 ENCOUNTER — Ambulatory Visit (HOSPITAL_BASED_OUTPATIENT_CLINIC_OR_DEPARTMENT_OTHER): Payer: Medicare HMO | Attending: Pulmonary Disease

## 2014-12-18 DIAGNOSIS — I493 Ventricular premature depolarization: Secondary | ICD-10-CM | POA: Diagnosis not present

## 2014-12-18 DIAGNOSIS — G4733 Obstructive sleep apnea (adult) (pediatric): Secondary | ICD-10-CM | POA: Diagnosis not present

## 2014-12-18 DIAGNOSIS — R0683 Snoring: Secondary | ICD-10-CM | POA: Diagnosis not present

## 2014-12-20 ENCOUNTER — Telehealth: Payer: Self-pay | Admitting: Pulmonary Disease

## 2014-12-20 DIAGNOSIS — G4733 Obstructive sleep apnea (adult) (pediatric): Secondary | ICD-10-CM

## 2014-12-20 DIAGNOSIS — G4730 Sleep apnea, unspecified: Secondary | ICD-10-CM | POA: Diagnosis not present

## 2014-12-20 NOTE — Telephone Encounter (Signed)
Send prescription for- CPAP therapy on 8 cm H2O with a Medium size Resmed Full Face Mask Quattro Air mask and heated humidification.  Download in 4 weeks Office visit with TP/me in 6 weeks

## 2014-12-20 NOTE — Telephone Encounter (Signed)
Patient returned call, may be reached at [phone].

## 2014-12-20 NOTE — Telephone Encounter (Signed)
Left message to call back. CPAP Ordered.

## 2014-12-20 NOTE — Progress Notes (Signed)
Patient Name: Jeffery Cline, Jeffery Cline Date: 12/18/2014 Gender: Male D.O.B: May 28, 1945 Age (years): 45 Referring Provider: Kara Mead MD, ABSM Height (inches): 70 Interpreting Physician: Kara Mead MD, ABSM Weight (lbs): 235 RPSGT: Madelon Lips BMI: 34 MRN: [id number] Neck Size: 18.50   CLINICAL INFORMATION The patient is referred for a CPAP titration to treat sleep apnea. Date of HST: 07/2014 moderate OSA with AHI 27/hour   SLEEP STUDY TECHNIQUE As per the AASM Manual for the Scoring of Sleep and Associated Events v2.3 (April 2016) with a hypopnea requiring 4% desaturations. The channels recorded and monitored were frontal, central and occipital EEG, electrooculogram (EOG), submentalis EMG (chin), nasal and oral airflow, thoracic and abdominal wall motion, anterior tibialis EMG, snore microphone, electrocardiogram, and pulse oximetry. Continuous positive airway pressure (CPAP) was initiated at the beginning of the study and titrated to treat sleep-disordered breathing.   MEDICATIONS Medications administered by patient during sleep study : No sleep medicine administered.   TECHNICIAN COMMENTS Comments added by technician: Patient had more than two awakenings to use the bathroom   RESPIRATORY PARAMETERS Optimal PAP Pressure (cm): 8 AHI at Optimal Pressure (/hr): 1.9 Overall Minimal O2 (%): 87.00 Supine % at Optimal Pressure (%): 0 Minimal O2 at Optimal Pressure (%): 89.0     SLEEP ARCHITECTURE The study was initiated at 10:21:01 PM and ended at 4:41:44 AM. Sleep onset time was 17.1 minutes and the sleep efficiency was 69.0%. The total sleep time was 262.6 minutes. The patient spent 9.14% of the night in stage N1 sleep, 74.49% in stage N2 sleep, 0.00% in stage N3 and 16.37% in REM.Stage REM latency was 167.0 minutes Wake after sleep onset was 101.0. Alpha intrusion was absent. Supine sleep was 0.00%.   CARDIAC DATA The 2 lead EKG demonstrated sinus rhythm. The mean heart rate  was 58.84 beats per minute. Other EKG findings include: PVCs.   LEG MOVEMENT DATA The total Periodic Limb Movements of Sleep (PLMS) were 247. The PLMS index was 56.44. A PLMS index of <15 is considered normal in adults.   IMPRESSIONS - The optimal PAP pressure was 8 cm of water. - Central sleep apnea was not noted during this titration (CAI = 0.0/h). - Mild oxygen desaturations were observed during this titration (min O2 = 87.00%). - The patient snored with Soft snoring volume during this titration study. - 2-lead EKG demonstrated: PVCs - Severe periodic limb movements were observed during this study. Arousals associated with PLMs were rare.   DIAGNOSIS - Obstructive Sleep Apnea (327.23 [G47.33 ICD-10])   RECOMMENDATIONS - Trial of CPAP therapy on 8 cm H2O with a Medium size Resmed Full Face Mask Quattro Air mask and heated humidification. - Avoid alcohol, sedatives and other CNS depressants that may worsen sleep apnea and disrupt normal sleep architecture. - Sleep hygiene should be reviewed to assess factors that may improve sleep quality. - Weight management and regular exercise should be initiated or continued. - Return  for re-evaluation after 4 weeks of therapy  Kara Mead MD. FCCP. [facility]

## 2014-12-20 NOTE — Telephone Encounter (Signed)
Spoke with patient.  Advised patient that CPAP has been ordered and that I will contact him in a few weeks to follow up with him to schedule a follow up appointment and remind him of download. Patient put in reminder list. Nothing further needed. Closing encounter

## 2015-01-02 DIAGNOSIS — G4733 Obstructive sleep apnea (adult) (pediatric): Secondary | ICD-10-CM | POA: Diagnosis not present

## 2015-01-02 DIAGNOSIS — Z23 Encounter for immunization: Secondary | ICD-10-CM | POA: Diagnosis not present

## 2015-01-21 ENCOUNTER — Telehealth: Payer: Self-pay | Admitting: *Deleted

## 2015-01-21 NOTE — Telephone Encounter (Signed)
Called patient to see if he has his CPAP machine set up yet. If so, needs to schedule follow up appointment in 6 weeks with TP or RA Left message for patient to call back.

## 2015-01-21 NOTE — Telephone Encounter (Signed)
-----   Message from Glean Hess, [place] sent at 12/20/2014  2:46 PM EST ----- Regarding: CPAP Follow up CPAP ordered 12/20/14, need download in 4 weeks, appointment in 6 weeks after CPAP set up.

## 2015-01-22 NOTE — Telephone Encounter (Signed)
Patient returned call, may be reached at [phone].

## 2015-01-23 NOTE — Telephone Encounter (Signed)
Spoke with the pt and he states that he has been set up on CPAP  OV with RA at the [facility] office on 02/28/15 at 3:45

## 2015-02-01 DIAGNOSIS — G4733 Obstructive sleep apnea (adult) (pediatric): Secondary | ICD-10-CM | POA: Diagnosis not present

## 2015-02-01 DIAGNOSIS — Z23 Encounter for immunization: Secondary | ICD-10-CM | POA: Diagnosis not present

## 2015-02-14 ENCOUNTER — Telehealth: Payer: Self-pay | Admitting: Pulmonary Disease

## 2015-02-14 NOTE — Telephone Encounter (Signed)
Compliance Report Results  Per RA:  CPAP 8 cm very effective Mild leak + ensure good seal

## 2015-02-18 NOTE — Telephone Encounter (Signed)
Patient notified of Compliance report results. Patient says that he thinks that he may need a larger mask. He says that he will bring his mask with him to the next OV. Nothing further needed.

## 2015-02-19 ENCOUNTER — Ambulatory Visit: Payer: Medicare HMO | Admitting: Adult Health

## 2015-02-20 ENCOUNTER — Encounter: Payer: Self-pay | Admitting: Pulmonary Disease

## 2015-02-21 ENCOUNTER — Encounter (HOSPITAL_BASED_OUTPATIENT_CLINIC_OR_DEPARTMENT_OTHER): Payer: Medicare HMO

## 2015-02-28 ENCOUNTER — Ambulatory Visit (INDEPENDENT_AMBULATORY_CARE_PROVIDER_SITE_OTHER): Payer: Medicare HMO | Admitting: Pulmonary Disease

## 2015-02-28 ENCOUNTER — Encounter: Payer: Self-pay | Admitting: Pulmonary Disease

## 2015-02-28 VITALS — BP 151/86 | HR 59 | Ht 70.0 in | Wt 247.0 lb

## 2015-02-28 DIAGNOSIS — I10 Essential (primary) hypertension: Secondary | ICD-10-CM

## 2015-02-28 DIAGNOSIS — G4733 Obstructive sleep apnea (adult) (pediatric): Secondary | ICD-10-CM

## 2015-02-28 NOTE — Progress Notes (Signed)
   Subjective:    Patient ID: Jeffery Cline, male    DOB: 10-Sep-1945, 70 y.o.   MRN: [id number]  HPI 70 year old, overweight hypertensive  for FU of OSA. He reports nocturia and erectile dysfunction, was evaluated by endocrinology for low testosterone and felt that his symptoms could be attributable to OSA. He had a sleep study done 10 years ago which was reportedly negative. He gained about 40 pounds since then Home sleep study 07/2014 showed moderate OSA with AHI 27/hour and lowest desaturation of 79%. Total sleep time was 8 hours.  He reports breakthrough GERD symptoms causing wheezing and cough He is a lifetime never smoker  12/2014 started on CPAP therapy on 8 cm   Download 02/2014 good usage > 6h, AHI 5/h, leak ++, on 8 cm  He reports good improvement in his sleep and feels better rested in the daytime has more energy no naps No snoring has been noted by his wife,  Review of Systems neg for any significant sore throat, dysphagia, itching, sneezing, nasal congestion or excess/ purulent secretions, fever, chills, sweats, unintended wt loss, pleuritic or exertional cp, hempoptysis, orthopnea pnd or change in chronic leg swelling. Also denies presyncope, palpitations, heartburn, abdominal pain, nausea, vomiting, diarrhea or change in bowel or urinary habits, dysuria,hematuria, rash, arthralgias, visual complaints, headache, numbness weakness or ataxia.     Objective:   Physical Exam  Gen. Pleasant, obese, in no distress ENT - no lesions, no post nasal drip Neck: No JVD, no thyromegaly, no carotid bruits Lungs: no use of accessory muscles, no dullness to percussion, decreased without rales or rhonchi  Cardiovascular: Rhythm regular, heart sounds  normal, no murmurs or gallops, no peripheral edema Musculoskeletal: No deformities, no cyanosis or clubbing , no tremors       Assessment & Plan:

## 2015-02-28 NOTE — Assessment & Plan Note (Signed)
Your CPAP is set at 8 cm CPAP supplies will be renewed x 1 year  Weight loss encouraged, compliance with goal of at least 4-6 hrs every night is the expectation. Advised against medications with sedative side effects Cautioned against driving when sleepy - understanding that sleepiness will vary on a day to day basis

## 2015-02-28 NOTE — Patient Instructions (Signed)
Your CPAP is set at 8 cm CPAP supplies will be renewed x 1 year

## 2015-03-01 NOTE — Assessment & Plan Note (Signed)
Follow up with PCP

## 2015-03-04 DIAGNOSIS — G4733 Obstructive sleep apnea (adult) (pediatric): Secondary | ICD-10-CM | POA: Diagnosis not present

## 2015-03-04 DIAGNOSIS — Z23 Encounter for immunization: Secondary | ICD-10-CM | POA: Diagnosis not present

## 2015-03-07 ENCOUNTER — Encounter: Payer: Self-pay | Admitting: Pulmonary Disease

## 2015-04-04 DIAGNOSIS — Z23 Encounter for immunization: Secondary | ICD-10-CM | POA: Diagnosis not present

## 2015-04-04 DIAGNOSIS — G4733 Obstructive sleep apnea (adult) (pediatric): Secondary | ICD-10-CM | POA: Diagnosis not present

## 2015-04-16 DIAGNOSIS — H2513 Age-related nuclear cataract, bilateral: Secondary | ICD-10-CM | POA: Diagnosis not present

## 2015-04-16 DIAGNOSIS — H401131 Primary open-angle glaucoma, bilateral, mild stage: Secondary | ICD-10-CM | POA: Diagnosis not present

## 2015-04-16 DIAGNOSIS — H524 Presbyopia: Secondary | ICD-10-CM | POA: Diagnosis not present

## 2015-04-26 ENCOUNTER — Telehealth: Payer: Self-pay

## 2015-04-26 NOTE — Telephone Encounter (Signed)
Pre vist call made to patient

## 2015-04-29 ENCOUNTER — Ambulatory Visit (INDEPENDENT_AMBULATORY_CARE_PROVIDER_SITE_OTHER): Payer: Medicare HMO | Admitting: Internal Medicine

## 2015-04-29 ENCOUNTER — Other Ambulatory Visit: Payer: Self-pay

## 2015-04-29 ENCOUNTER — Encounter: Payer: Self-pay | Admitting: Internal Medicine

## 2015-04-29 VITALS — BP 126/68 | HR 55 | Temp 97.8°F | Ht 70.0 in | Wt 247.5 lb

## 2015-04-29 DIAGNOSIS — D509 Iron deficiency anemia, unspecified: Secondary | ICD-10-CM | POA: Diagnosis not present

## 2015-04-29 DIAGNOSIS — I10 Essential (primary) hypertension: Secondary | ICD-10-CM | POA: Diagnosis not present

## 2015-04-29 DIAGNOSIS — R7989 Other specified abnormal findings of blood chemistry: Secondary | ICD-10-CM

## 2015-04-29 DIAGNOSIS — E291 Testicular hypofunction: Secondary | ICD-10-CM

## 2015-04-29 DIAGNOSIS — E785 Hyperlipidemia, unspecified: Secondary | ICD-10-CM

## 2015-04-29 DIAGNOSIS — Z09 Encounter for follow-up examination after completed treatment for conditions other than malignant neoplasm: Secondary | ICD-10-CM

## 2015-04-29 DIAGNOSIS — Z0000 Encounter for general adult medical examination without abnormal findings: Secondary | ICD-10-CM

## 2015-04-29 NOTE — Patient Instructions (Signed)
Please schedule labs to be done within few days (fasting)   Please consider visit these websites for more information regards healthcare power of attorney:  [email or web address]  [email or web address]   Next  visit in 4 months     Fall Prevention and Home Safety Falls cause injuries and can affect all age groups. It is possible to use preventive measures to significantly decrease the likelihood of falls. There are many simple measures which can make your home safer and prevent falls. OUTDOORS  Repair cracks and edges of walkways and driveways.  Remove high doorway thresholds.  Trim shrubbery on the main path into your home.  Have good outside lighting.  Clear walkways of tools, rocks, debris, and clutter.  Check that handrails are not broken and are securely fastened. Both sides of steps should have handrails.  Have leaves, snow, and ice cleared regularly.  Use sand or salt on walkways during winter months.  In the garage, clean up grease or oil spills. BATHROOM  Install night lights.  Install grab bars by the toilet and in the tub and shower.  Use non-skid mats or decals in the tub or shower.  Place a plastic non-slip stool in the shower to sit on, if needed.  Keep floors dry and clean up all water on the floor immediately.  Remove soap buildup in the tub or shower on a regular basis.  Secure bath mats with non-slip, double-sided rug tape.  Remove throw rugs and tripping hazards from the floors. BEDROOMS  Install night lights.  Make sure a bedside light is easy to reach.  Do not use oversized bedding.  Keep a telephone by your bedside.  Have a firm chair with side arms to use for getting dressed.  Remove throw rugs and tripping hazards from the floor. KITCHEN  Keep handles on pots and pans turned toward the center of the stove. Use back burners when possible.  Clean up spills quickly and allow time for drying.  Avoid walking on  wet floors.  Avoid hot utensils and knives.  Position shelves so they are not too high or low.  Place commonly used objects within easy reach.  If necessary, use a sturdy step stool with a grab bar when reaching.  Keep electrical cables out of the way.  Do not use floor polish or wax that makes floors slippery. If you must use wax, use non-skid floor wax.  Remove throw rugs and tripping hazards from the floor. STAIRWAYS  Never leave objects on stairs.  Place handrails on both sides of stairways and use them. Fix any loose handrails. Make sure handrails on both sides of the stairways are as long as the stairs.  Check carpeting to make sure it is firmly attached along stairs. Make repairs to worn or loose carpet promptly.  Avoid placing throw rugs at the top or bottom of stairways, or properly secure the rug with carpet tape to prevent slippage. Get rid of throw rugs, if possible.  Have an electrician put in a light switch at the top and bottom of the stairs. OTHER FALL PREVENTION TIPS  Wear low-heel or rubber-soled shoes that are supportive and fit well. Wear closed toe shoes.  When using a stepladder, make sure it is fully opened and both spreaders are firmly locked. Do not climb a closed stepladder.  Add color or contrast paint or tape to grab bars and handrails in your home. Place contrasting color strips on first and last steps.  Learn and use mobility aids  as needed. Install an electrical emergency response system.  Turn on lights to avoid dark areas. Replace light bulbs that burn out immediately. Get light switches that glow.  Arrange furniture to create clear pathways. Keep furniture in the same place.  Firmly attach carpet with non-skid or double-sided tape.  Eliminate uneven floor surfaces.  Select a carpet pattern that does not visually hide the edge of steps.  Be aware of all pets. OTHER HOME SAFETY TIPS  Set the water temperature for 120 F (48.8  C).  Keep emergency numbers on or near the telephone.  Keep smoke detectors on every level of the home and near sleeping areas. Document Released: 01/16/2002 Document Revised: 07/28/2011 Document Reviewed: 04/17/2011 [facility] Patient Information 2015 [place], [place]. This information is not intended to replace advice given to you by your health care provider. Make sure you discuss any questions you have with your health care provider.   Preventive Care for Adults Ages 90 and over  Blood pressure check.** / Every 1 to 2 years.  Lipid and cholesterol check.**/ Every 5 years beginning at age 3.  Lung cancer screening. / Every year if you are aged 53-80 years and have a 30-pack-year history of smoking and currently smoke or have quit within the past 15 years. Yearly screening is stopped once you have quit smoking for at least 15 years or develop a health problem that would prevent you from having lung cancer treatment.  Fecal occult blood test (FOBT) of stool. / Every year beginning at age 4 and continuing until age 75. You may not have to do this test if you get a colonoscopy every 10 years.  Flexible sigmoidoscopy** or colonoscopy.** / Every 5 years for a flexible sigmoidoscopy or every 10 years for a colonoscopy beginning at age 54 and continuing until age 3.  Hepatitis C blood test.** / For all people born from [birth year] through 1965 and any individual with known risks for hepatitis C.  Abdominal aortic aneurysm (AAA) screening.** / A one-time screening for ages 48 to 80 years who are current or former smokers.  Skin self-exam. / Monthly.  Influenza vaccine. / Every year.  Tetanus, diphtheria, and acellular pertussis (Tdap/Td) vaccine.** / 1 dose of Td every 10 years.  Varicella vaccine.** / Consult your health care provider.  Zoster vaccine.** / 1 dose for adults aged 17 years or older.  Pneumococcal 13-valent conjugate (PCV13) vaccine.** / Consult your health care  provider.  Pneumococcal polysaccharide (PPSV23) vaccine.** / 1 dose for all adults aged 70 years and older.  Meningococcal vaccine.** / Consult your health care provider.  Hepatitis A vaccine.** / Consult your health care provider.  Hepatitis B vaccine.** / Consult your health care provider.  Haemophilus influenzae type b (Hib) vaccine.** / Consult your health care provider. **Family history and personal history of risk and conditions may change your health care provider's recommendations. Document Released: 03/24/2001 Document Revised: 01/31/2013 Document Reviewed: 06/23/2010 [facility] Patient Information 2015 [place], [place]. This information is not intended to replace advice given to you by your health care provider. Make sure you discuss any questions you have with your health care provider.

## 2015-04-29 NOTE — Progress Notes (Signed)
Pre visit review using our clinic review tool, if applicable. No additional management support is needed unless otherwise documented below in the visit note. 

## 2015-04-29 NOTE — Progress Notes (Signed)
Subjective:    Patient ID: Jeffery Cline, male    DOB: [birth year], 70 y.o.   MRN: [id number]  DOS:  04/29/2015 Type of visit - description : CPX Interval history: HTN: Good compliance with medication, not ambulatory BPs Sleep apnea: Doing well with CPAP, feels overall slightly better. Low testosterone, low libido--notes from endocrinology reviewed, libido is still an issue but is not causing any problems.    Review of Systems  Constitutional: No fever. No chills. No unexplained wt changes. No unusual sweats  HEENT: No dental problems, no ear discharge, no facial swelling, no voice changes. No eye discharge, no eye  redness , no  intolerance to light   Respiratory: No wheezing , no  difficulty breathing. No cough , no mucus production  Cardiovascular: No CP, no leg swelling , no  Palpitations  GI: no nausea, no vomiting, no diarrhea , no  abdominal pain.  No blood in the stools. No dysphagia, no odynophagia    Endocrine: No polyphagia, no polyuria , no polydipsia  GU: No dysuria, gross hematuria, difficulty urinating. occ  urinary urgency, no frequency.  Musculoskeletal: No joint swellings or unusual aches or pains  Skin: No change in the color of the skin, palor , no  Rash  Allergic, immunologic: No environmental allergies , no  food allergies  Neurological: No dizziness no  syncope. No headaches. No diplopia, no slurred, no slurred speech, no motor deficits, no facial  Numbness  Hematological: No enlarged lymph nodes, no easy bruising , no unusual bleedings  Psychiatry: No suicidal ideas, no hallucinations, no beavior problems, no confusion.  No unusual/severe anxiety, no depression  Past Medical History  Diagnosis Date  . Hyperlipidemia   . Hypertension   . ED (erectile dysfunction)   . Allergic rhinitis   . GERD (gastroesophageal reflux disease)   . Decreased carotid pulse     decreased L carotid pulse (?) : Nl carotidu/s 02-2006    . Sleep apnea   . Basal  cell carcinoma 2016    Right medial posterior shoulder, shave and ED&C    Past Surgical History  Procedure Laterality Date  . Pilonidal cyst excision    . Inguinal hernia repair      right by dr. Ninfa Linden  . Cystectomy      back  . Colonoscopy  01/09/2002    internal and external hemorrhoids  . Tonsillectomy      Social History   Social History  . Marital Status: Married    Spouse Name: N/A  . Number of Children: 2  . Years of Education: N/A   Occupational History  . retired    Social History Main Topics  . Smoking status: Never Smoker   . Smokeless tobacco: Never Used  . Alcohol Use: Yes     Comment: less than 1 drink per day  . Drug Use: No  . Sexual Activity: Not on file   Other Topics Concern  . Not on file   Social History Narrative   Married. 2 sons ([place] and [place]) , 5 grandkids       Family History  Problem Relation Age of Onset  . Dementia Mother   . Coronary artery disease Father 87  . Coronary artery disease Maternal Grandfather   . Leukemia Paternal Grandfather   . Alcohol abuse Mother   . Colon cancer Neg Hx   . Prostate cancer Neg Hx        Medication List  This list is accurate as of: 04/29/15 11:59 PM.  Always use your most recent med list.               amLODipine 5 MG tablet  Commonly known as:  NORVASC  Take 1 tablet (5 mg total) by mouth daily.     aspirin 81 MG tablet  Take 81 mg by mouth daily.     bimatoprost 0.01 % Soln  Commonly known as:  LUMIGAN  1 drop at bedtime. Reported on 04/29/2015     COMBIGAN 0.2-0.5 % ophthalmic solution  Generic drug:  brimonidine-timolol  INSTILL 1 DROP IN BOTH EYES TWICE A DAY     fenofibrate 145 MG tablet  Commonly known as:  TRICOR  Take 1 tablet (145 mg total) by mouth daily.     losartan 100 MG tablet  Commonly known as:  COZAAR  Take 1 tablet (100 mg total) by mouth daily.     metoprolol 50 MG tablet  Commonly known as:  LOPRESSOR  Take 1 tablet (50 mg total) by mouth  2 (two) times daily.     omeprazole 40 MG capsule  Commonly known as:  PRILOSEC  Take 40 mg by mouth daily. Reported on 04/29/2015           Objective:   Physical Exam BP 126/68 mmHg  Pulse 55  Temp(Src) 97.8 F (36.6 C) (Oral)  Ht 5\' 10"  (1.778 m)  Wt 247 lb 8 oz (112.265 kg)  BMI 35.51 kg/m2  SpO2 97%  General:   Well developed, well nourished . NAD.  Neck: No  thyromegaly HEENT:  Normocephalic . Face symmetric, atraumatic Lungs:  CTA B Normal respiratory effort, no intercostal retractions, no accessory muscle use. Heart: RRR,  no murmur.  Trace  pretibial edema bilaterally  Abdomen:  Not distended, soft, non-tender. No rebound or rigidity.   Skin: Exposed areas without rash. Not pale. Not jaundice Neurologic:  alert & oriented X3.  Speech normal, gait appropriate for age and unassisted Strength symmetric and appropriate for age.  Psych: Cognition and judgment appear intact.  Cooperative with normal attention span and concentration.  Behavior appropriate. No anxious or depressed appearing.    Assessment & Plan:   Assessment  prediabetes  Morbid obesity HTN Hyperlipidemia Low testosterone: Saw endocrinology 06/2014 Decreased libido  ED OSA -- on CPAP , rx Dr Elsworth Soho GERD Decreased L carotid pulse? Normal carotid ultrasound 2008 [place] 2016 Glaucoma   PLAN: prediabetes: Check A1c Morbid obesity: Long discussion about diet HTN: Seems well-controlled, check labs Hyperlipidemia: Continue TriCor, check FLP Fatigue: Improved after he started CPAP Low testosterone: Saw endocrinology 06/2014, it was felt he needed to treat OSA-obesity before rechecking his testosterone, patient was somewhat reluctant to treatment and he remains reluctant today consequently won't check labs for testosterone today. Low iron: workup for low testosterone disclosed  low iron. Recheck labs. GI ROS negative, up-to-date on colonoscopies for screening. RTC 4  months.

## 2015-04-29 NOTE — Assessment & Plan Note (Addendum)
Td 09-2008;pneumonia 2012 ;Prevnar 2016; Zostavax 02-2008  Colonoscopy:   Date:  01/09/2002, normal Colonoscopy 04/2011, 2 polyps, Dr. Carlean Purl, next 2018.   Prostate cancer screening: DRE PSA 2016 wnl   diet-exercise discussed

## 2015-04-30 DIAGNOSIS — Z09 Encounter for follow-up examination after completed treatment for conditions other than malignant neoplasm: Secondary | ICD-10-CM | POA: Insufficient documentation

## 2015-04-30 NOTE — Assessment & Plan Note (Signed)
prediabetes: Check A1c Morbid obesity: Long discussion about diet HTN: Seems well-controlled, check labs Hyperlipidemia: Continue TriCor, check FLP Fatigue: Improved after he started CPAP Low testosterone: Saw endocrinology 06/2014, it was felt he needed to treat OSA-obesity before rechecking his testosterone, patient was somewhat reluctant to treatment and he remains reluctant today consequently won't check labs for testosterone today. Low iron: workup for low testosterone disclosed  low iron. Recheck labs. GI ROS negative, up-to-date on colonoscopies for screening. RTC 4 months.

## 2015-05-01 ENCOUNTER — Other Ambulatory Visit (INDEPENDENT_AMBULATORY_CARE_PROVIDER_SITE_OTHER): Payer: Medicare HMO

## 2015-05-01 DIAGNOSIS — E785 Hyperlipidemia, unspecified: Secondary | ICD-10-CM | POA: Diagnosis not present

## 2015-05-01 DIAGNOSIS — D509 Iron deficiency anemia, unspecified: Secondary | ICD-10-CM | POA: Diagnosis not present

## 2015-05-01 DIAGNOSIS — I10 Essential (primary) hypertension: Secondary | ICD-10-CM | POA: Diagnosis not present

## 2015-05-01 LAB — CBC WITH DIFFERENTIAL/PLATELET
BASOS ABS: 0.1 10*3/uL (ref 0.0–0.1)
Basophils Relative: 0.7 % (ref 0.0–3.0)
EOS PCT: 7.2 % — AB (ref 0.0–5.0)
Eosinophils Absolute: 0.7 10*3/uL (ref 0.0–0.7)
HEMATOCRIT: 43.2 % (ref 39.0–52.0)
Hemoglobin: 14.7 g/dL (ref 13.0–17.0)
LYMPHS PCT: 29.7 % (ref 12.0–46.0)
Lymphs Abs: 2.9 10*3/uL (ref 0.7–4.0)
MCHC: 34.1 g/dL (ref 30.0–36.0)
MCV: 91.5 fl (ref 78.0–100.0)
MONOS PCT: 11.3 % (ref 3.0–12.0)
Monocytes Absolute: 1.1 10*3/uL — ABNORMAL HIGH (ref 0.1–1.0)
NEUTROS ABS: 4.9 10*3/uL (ref 1.4–7.7)
Neutrophils Relative %: 51.1 % (ref 43.0–77.0)
PLATELETS: 195 10*3/uL (ref 150.0–400.0)
RBC: 4.72 Mil/uL (ref 4.22–5.81)
RDW: 13.2 % (ref 11.5–15.5)
WBC: 9.7 10*3/uL (ref 4.0–10.5)

## 2015-05-01 LAB — BASIC METABOLIC PANEL
BUN: 18 mg/dL (ref 6–23)
CALCIUM: 9.3 mg/dL (ref 8.4–10.5)
CHLORIDE: 105 meq/L (ref 96–112)
CO2: 28 mEq/L (ref 19–32)
CREATININE: 1.03 mg/dL (ref 0.40–1.50)
GFR: 75.9 mL/min (ref >60.00)
Glucose, Bld: 125 mg/dL — ABNORMAL HIGH (ref 70–99)
Potassium: 3.9 mEq/L (ref 3.5–5.1)
Sodium: 139 mEq/L (ref 135–145)

## 2015-05-01 LAB — LIPID PANEL
CHOL/HDL RATIO: 6
CHOLESTEROL: 170 mg/dL (ref 0–200)
HDL: 30.9 mg/dL — ABNORMAL LOW (ref >39.00)
NonHDL: 139.12
Triglycerides: 210 mg/dL — ABNORMAL HIGH (ref 0.0–149.0)
VLDL: 42 mg/dL — ABNORMAL HIGH (ref 0.0–40.0)

## 2015-05-01 LAB — FERRITIN: Ferritin: 398.9 ng/mL — ABNORMAL HIGH (ref 22.0–322.0)

## 2015-05-01 LAB — LDL CHOLESTEROL, DIRECT: LDL DIRECT: 107 mg/dL

## 2015-05-01 LAB — IRON: Iron: 76 ug/dL (ref 42–165)

## 2015-05-02 DIAGNOSIS — Z23 Encounter for immunization: Secondary | ICD-10-CM | POA: Diagnosis not present

## 2015-05-02 DIAGNOSIS — G4733 Obstructive sleep apnea (adult) (pediatric): Secondary | ICD-10-CM | POA: Diagnosis not present

## 2015-06-02 DIAGNOSIS — Z23 Encounter for immunization: Secondary | ICD-10-CM | POA: Diagnosis not present

## 2015-06-02 DIAGNOSIS — G4733 Obstructive sleep apnea (adult) (pediatric): Secondary | ICD-10-CM | POA: Diagnosis not present

## 2015-06-25 ENCOUNTER — Other Ambulatory Visit: Payer: Self-pay | Admitting: Internal Medicine

## 2015-07-02 DIAGNOSIS — G4733 Obstructive sleep apnea (adult) (pediatric): Secondary | ICD-10-CM | POA: Diagnosis not present

## 2015-07-02 DIAGNOSIS — Z23 Encounter for immunization: Secondary | ICD-10-CM | POA: Diagnosis not present

## 2015-07-03 DIAGNOSIS — G4733 Obstructive sleep apnea (adult) (pediatric): Secondary | ICD-10-CM | POA: Diagnosis not present

## 2015-07-12 DIAGNOSIS — L723 Sebaceous cyst: Secondary | ICD-10-CM | POA: Diagnosis not present

## 2015-07-30 ENCOUNTER — Ambulatory Visit (INDEPENDENT_AMBULATORY_CARE_PROVIDER_SITE_OTHER): Payer: Medicare HMO | Admitting: Internal Medicine

## 2015-07-30 ENCOUNTER — Encounter: Payer: Self-pay | Admitting: Internal Medicine

## 2015-07-30 VITALS — BP 118/74 | HR 57 | Temp 97.7°F | Ht 70.0 in | Wt 239.2 lb

## 2015-07-30 DIAGNOSIS — J069 Acute upper respiratory infection, unspecified: Secondary | ICD-10-CM | POA: Diagnosis not present

## 2015-07-30 MED ORDER — AZELASTINE HCL 0.1 % NA SOLN: 2.0000 | Freq: Every evening | NASAL | Status: DC | PRN

## 2015-07-30 MED ORDER — AZITHROMYCIN 250 MG PO TABS: ORAL_TABLET | ORAL | Status: DC

## 2015-07-30 NOTE — Assessment & Plan Note (Signed)
URI-bronchitis- see AVS Low iron: Labs 04-2015 normal hemoglobin, no iron deficiency RTC as schedule next month

## 2015-07-30 NOTE — Progress Notes (Signed)
Subjective:    Patient ID: Jeffery Cline, male    DOB: Apr 12, 1945, 70 y.o.   MRN: [id number]  DOS:  07/30/2015 Type of visit - description : Acute visit Interval history: Symptoms started 2 weeks ago with postnasal dripping, sinus congestion, cough with some sputum production. He is taking Mucinex, nasal sprays, he even restarted his GERD medication but the symptoms continue about the same. Pt not sure if the mucus he coughs up is coming from the chest or from the sinuses   Review of Systems   denies fever chills No chest pain or difficulty breathing No nausea or vomiting Past Medical History  Diagnosis Date  . Hyperlipidemia   . Hypertension   . ED (erectile dysfunction)   . Allergic rhinitis   . GERD (gastroesophageal reflux disease)   . Decreased carotid pulse     decreased L carotid pulse (?) : Nl carotidu/s 02-2006    . Basal cell carcinoma 2016    Right medial posterior shoulder, shave and ED&C  . Sleep apnea     on CPAP    Past Surgical History  Procedure Laterality Date  . Pilonidal cyst excision    . Inguinal hernia repair      right by dr. Ninfa Linden  . Cystectomy      back, recurrent  . Tonsillectomy      Social History   Social History  . Marital Status: Married    Spouse Name: N/A  . Number of Children: 2  . Years of Education: N/A   Occupational History  . retired    Social History Main Topics  . Smoking status: Never Smoker   . Smokeless tobacco: Never Used  . Alcohol Use: Yes     Comment: less than 1 drink per day  . Drug Use: No  . Sexual Activity: Not on file   Other Topics Concern  . Not on file   Social History Narrative   Married. 2 sons ([place] and [place]) , 5 grandkids          Medication List       This list is accurate as of: 07/30/15  1:55 PM.  Always use your most recent med list.               amLODipine 5 MG tablet  Commonly known as:  NORVASC  Take 1 tablet (5 mg total) by mouth daily.     aspirin 81 MG tablet   Take 81 mg by mouth daily.     azelastine 0.1 % nasal spray  Commonly known as:  ASTELIN  Place 2 sprays into both nostrils at bedtime as needed for rhinitis. Use in each nostril as directed     azithromycin 250 MG tablet  Commonly known as:  ZITHROMAX Z-PAK  2 tabs a day the first day, then 1 tab a day x 4 days     bimatoprost 0.01 % Soln  Commonly known as:  LUMIGAN  1 drop at bedtime. Reported on 04/29/2015     COMBIGAN 0.2-0.5 % ophthalmic solution  Generic drug:  brimonidine-timolol  INSTILL 1 DROP IN BOTH EYES TWICE A DAY     fenofibrate 145 MG tablet  Commonly known as:  TRICOR  Take 1 tablet (145 mg total) by mouth daily.     losartan 100 MG tablet  Commonly known as:  COZAAR  Take 1 tablet (100 mg total) by mouth daily.     metoprolol 50 MG tablet  Commonly known  as:  LOPRESSOR  Take 1 tablet (50 mg total) by mouth 2 (two) times daily.     omeprazole 40 MG capsule  Commonly known as:  PRILOSEC  Take 40 mg by mouth daily. Reported on 04/29/2015           Objective:   Physical Exam BP 118/74 mmHg  Pulse 57  Temp(Src) 97.7 F (36.5 C) (Oral)  Ht 5\' 10"  (1.778 m)  Wt 239 lb 4 oz (108.523 kg)  BMI 34.33 kg/m2  SpO2 93% General:   Well developed, well nourished . NAD.  HEENT:  Normocephalic . Face symmetric, atraumatic TMs wnl Throat- sumetric, no red, no d/c Lungs:  + ronchi w/ cough, no wheezing Normal respiratory effort, no intercostal retractions, no accessory muscle use. Heart: RRR,  no murmur.  No pretibial edema bilaterally  Skin: Not pale. Not jaundice Neurologic:  alert & oriented X3.  Speech normal, gait appropriate for age and unassisted Psych--  Cognition and judgment appear intact.  Cooperative with normal attention span and concentration.  Behavior appropriate. No anxious or depressed appearing.      Assessment & Plan:   Assessment Prediabetes  Morbid obesity HTN Hyperlipidemia Low testosterone: Saw endocrinology  06/2014 Decreased libido  ED OSA -- on CPAP , rx Dr Elsworth Soho GERD Decreased L carotid pulse? Normal carotid ultrasound 2008 [place] 2016 Glaucoma   PLAN: URI-bronchitis- see AVS Low iron: Labs 04-2015 normal hemoglobin, no iron deficiency RTC as schedule next month

## 2015-07-30 NOTE — Progress Notes (Signed)
Pre visit review using our clinic review tool, if applicable. No additional management support is needed unless otherwise documented below in the visit note. 

## 2015-07-30 NOTE — Patient Instructions (Signed)
Rest, fluids , tylenol  For cough:  Take Mucinex DM twice a day as needed until better  For nasal congestion: Use OTC Nasocort or Flonase : 2 nasal sprays on each side of the nose in the morning until you feel better Use ASTELIN a prescribed spray : 2 nasal sprays on each side of the nose at night until you feel better   Avoid decongestants such as  Pseudoephedrine or phenylephrine   Take the antibiotic as prescribed  (zithromax) if not better in 3-4 days   Call if not gradually better over the next  10 days  Call anytime if the symptoms are severe

## 2015-07-31 ENCOUNTER — Ambulatory Visit: Payer: Medicare HMO | Admitting: Internal Medicine

## 2015-08-01 ENCOUNTER — Other Ambulatory Visit: Payer: Self-pay | Admitting: Internal Medicine

## 2015-08-02 DIAGNOSIS — Z23 Encounter for immunization: Secondary | ICD-10-CM | POA: Diagnosis not present

## 2015-08-02 DIAGNOSIS — G4733 Obstructive sleep apnea (adult) (pediatric): Secondary | ICD-10-CM | POA: Diagnosis not present

## 2015-08-12 ENCOUNTER — Telehealth: Payer: Self-pay | Admitting: Internal Medicine

## 2015-08-27 NOTE — Telephone Encounter (Signed)
Completed.

## 2015-08-28 ENCOUNTER — Ambulatory Visit: Payer: Medicare HMO | Admitting: Internal Medicine

## 2015-09-01 DIAGNOSIS — Z23 Encounter for immunization: Secondary | ICD-10-CM | POA: Diagnosis not present

## 2015-09-01 DIAGNOSIS — G4733 Obstructive sleep apnea (adult) (pediatric): Secondary | ICD-10-CM | POA: Diagnosis not present

## 2015-09-04 ENCOUNTER — Encounter: Payer: Self-pay | Admitting: Internal Medicine

## 2015-09-04 ENCOUNTER — Ambulatory Visit (INDEPENDENT_AMBULATORY_CARE_PROVIDER_SITE_OTHER): Payer: Medicare HMO | Admitting: Internal Medicine

## 2015-09-04 VITALS — BP 118/76 | HR 67 | Temp 97.6°F | Ht 70.0 in | Wt 242.0 lb

## 2015-09-04 DIAGNOSIS — R739 Hyperglycemia, unspecified: Secondary | ICD-10-CM

## 2015-09-04 DIAGNOSIS — K219 Gastro-esophageal reflux disease without esophagitis: Secondary | ICD-10-CM

## 2015-09-04 DIAGNOSIS — I10 Essential (primary) hypertension: Secondary | ICD-10-CM | POA: Diagnosis not present

## 2015-09-04 NOTE — Progress Notes (Signed)
Subjective:    Patient ID: Jeffery Cline, male    DOB: Apr 08, 1945, 70 y.o.   MRN: [id number]  DOS:  09/04/2015 Type of visit - description : rov Interval history: In general feeling well. Obesity: doing better with diet and exercise, + weight loss, now is regaining some. He is active, swimming. HTN: Good med compliance, ambulatory BPs are excellent. OSA: Good CPAP compliance  Wt Readings from Last 3 Encounters:  09/04/15 242 lb (109.8 kg)  07/30/15 239 lb 4 oz (108.5 kg)  04/29/15 247 lb 8 oz (112.3 kg)     Review of Systems  Denies chest pain or difficulty breathing No fatigue Past Medical History:  Diagnosis Date  . Allergic rhinitis   . Basal cell carcinoma 2016   Right medial posterior shoulder, shave and ED&C  . Decreased carotid pulse    decreased L carotid pulse (?) : Nl carotidu/s 02-2006    . ED (erectile dysfunction)   . GERD (gastroesophageal reflux disease)   . Hyperlipidemia   . Hypertension   . Sleep apnea    on CPAP    Past Surgical History:  Procedure Laterality Date  . CYSTECTOMY     back, recurrent  . INGUINAL HERNIA REPAIR     right by dr. Ninfa Linden  . PILONIDAL CYST EXCISION    . TONSILLECTOMY      Social History   Social History  . Marital status: Married    Spouse name: N/A  . Number of children: 2  . Years of education: N/A   Occupational History  . retired    Social History Main Topics  . Smoking status: Never Smoker  . Smokeless tobacco: Never Used  . Alcohol use Yes     Comment: less than 1 drink per day  . Drug use: No  . Sexual activity: Not on file   Other Topics Concern  . Not on file   Social History Narrative   Married. 2 sons ([place] and [place]) , 5 grandkids          Medication List       Accurate as of 09/04/15 11:59 PM. Always use your most recent med list.          amLODipine 5 MG tablet Commonly known as:  NORVASC Take 1 tablet (5 mg total) by mouth daily.   aspirin 81 MG tablet Take 81 mg by  mouth daily.   COMBIGAN 0.2-0.5 % ophthalmic solution Generic drug:  brimonidine-timolol INSTILL 1 DROP IN BOTH EYES TWICE A DAY   fenofibrate 145 MG tablet Commonly known as:  TRICOR Take 1 tablet (145 mg total) by mouth daily.   losartan 100 MG tablet Commonly known as:  COZAAR Take 1 tablet (100 mg total) by mouth daily.   metoprolol 50 MG tablet Commonly known as:  LOPRESSOR Take 1 tablet (50 mg total) by mouth 2 (two) times daily.   ranitidine 75 MG tablet Commonly known as:  ZANTAC Take 75 mg by mouth 2 (two) times daily.          Objective:   Physical Exam BP 118/76 (BP Location: Left Arm, Patient Position: Sitting, Cuff Size: Normal)   Pulse 67   Temp 97.6 F (36.4 C) (Oral)   Ht 5\' 10"  (1.778 m)   Wt 242 lb (109.8 kg)   SpO2 97%   BMI 34.72 kg/m  General:   Well developed, well nourished . NAD.  HEENT:  Normocephalic . Face symmetric, atraumatic Lungs:  CTA  Normal respiratory effort, no intercostal retractions, no accessory muscle use. Heart: RRR,  no murmur.  No pretibial edema bilaterally  Skin: Not pale. Not jaundice Neurologic:  alert & oriented X3.  Speech normal, gait appropriate for age and unassisted Psych--  Cognition and judgment appear intact.  Cooperative with normal attention span and concentration.  Behavior appropriate. No anxious or depressed appearing.      Assessment & Plan:  Assessment Prediabetes  Morbid obesity HTN Hyperlipidemia Low testosterone: Saw endocrinology 06/2014 Decreased libido  ED OSA -- on CPAP , rx Dr Elsworth Soho GERD Decreased L carotid pulse? Normal carotid ultrasound 2008 [place] 2016 Glaucoma   PLAN: Prediabetes: Check A1c, encouraged to continue doing better with diet and exercise HTN: Continue amlodipine, losartan, Lopressor, check a BMP GERD: Self switch from Prilosec to ranitidine as needed. Doing well. RTC 04-2015 CPX

## 2015-09-04 NOTE — Patient Instructions (Addendum)
GO TO THE LAB : Get the blood work     GO TO THE FRONT DESK Schedule your next appointment for a  Physical exam by 3 - 2018    Check the  blood pressure 2 or 3 times a month   Be sure your blood pressure is between 110/65 and  145/85. If it is consistently higher or lower, let me know

## 2015-09-04 NOTE — Progress Notes (Signed)
Pre visit review using our clinic review tool, if applicable. No additional management support is needed unless otherwise documented below in the visit note. 

## 2015-09-05 DIAGNOSIS — K219 Gastro-esophageal reflux disease without esophagitis: Secondary | ICD-10-CM | POA: Insufficient documentation

## 2015-09-05 LAB — BASIC METABOLIC PANEL
BUN: 17 mg/dL (ref 6–23)
CALCIUM: 9.5 mg/dL (ref 8.4–10.5)
CHLORIDE: 105 meq/L (ref 96–112)
CO2: 28 meq/L (ref 19–32)
CREATININE: 0.96 mg/dL (ref 0.40–1.50)
GFR: 82.24 mL/min (ref >60.00)
GLUCOSE: 124 mg/dL — AB (ref 70–99)
Potassium: 4 mEq/L (ref 3.5–5.1)
Sodium: 140 mEq/L (ref 135–145)

## 2015-09-05 LAB — HEMOGLOBIN A1C: Hgb A1c MFr Bld: 6 % (ref 4.6–6.5)

## 2015-09-05 NOTE — Assessment & Plan Note (Signed)
Prediabetes: Check A1c, encouraged to continue doing better with diet and exercise HTN: Continue amlodipine, losartan, Lopressor, check a BMP GERD: Self switch from Prilosec to ranitidine as needed. Doing well. RTC 04-2015 CPX

## 2015-10-02 DIAGNOSIS — G4733 Obstructive sleep apnea (adult) (pediatric): Secondary | ICD-10-CM | POA: Diagnosis not present

## 2015-10-02 DIAGNOSIS — Z23 Encounter for immunization: Secondary | ICD-10-CM | POA: Diagnosis not present

## 2015-11-06 ENCOUNTER — Encounter: Payer: Self-pay | Admitting: Medical

## 2015-11-06 ENCOUNTER — Ambulatory Visit (INDEPENDENT_AMBULATORY_CARE_PROVIDER_SITE_OTHER): Payer: Medicare HMO | Admitting: Medical

## 2015-11-06 VITALS — BP 145/80 | HR 74 | Temp 100.0°F | Ht 70.0 in | Wt 243.6 lb

## 2015-11-06 DIAGNOSIS — R509 Fever, unspecified: Secondary | ICD-10-CM

## 2015-11-06 DIAGNOSIS — R197 Diarrhea, unspecified: Secondary | ICD-10-CM | POA: Diagnosis not present

## 2015-11-06 LAB — POCT RAPID INFLUENZA A&B

## 2015-11-06 MED ORDER — CIPROFLOXACIN HCL 500 MG PO TABS: 500.0000 mg | ORAL_TABLET | Freq: Two times a day (BID) | ORAL | 0 refills | Status: DC

## 2015-11-06 MED ORDER — METRONIDAZOLE 500 MG PO TABS: 500.0000 mg | ORAL_TABLET | Freq: Three times a day (TID) | ORAL | 0 refills | Status: DC

## 2015-11-06 NOTE — Patient Instructions (Addendum)
Considering viral vs bacterial cause for GI symptoms. Remote possible chance diverticulitis.  For your loose stools advise strict bland diet guidelines.  Hydrate well. Continue immodium. Tylenol for fever.  Will get cbc and cmp today.  Turn in stool panel kit tomorrow. Sooner the better.  I will make antibiotics available in event diarrhea worsens as we approach weekend.(and stool studies still pending) Only start antibiotic  if we advise when you update [place].   Follow up in 7 days or as needed.(Please update [place] on Friday before the weekend)

## 2015-11-06 NOTE — Progress Notes (Signed)
Subjective:    Patient ID: Jeffery Cline, male    DOB: [birth year], 70 y.o.   MRN: [id number]  HPI  Pt in states sick since last Thursday. Mild aches at first. Mild abdominal cramps with loose/water stools about 2-3 a day.  But then oday about 3-4 loose stools today.  Pt just got back from [place] on Sunday night.  On Tuesday he thought maybe better(he ate some hot dogs with beer) but then diarrhea persisting today again.  Pt had some fevers, chills and sweats.   Pt tried some immodium toward end of his trip.   On prior coloscocpy showed picture of one diverticulum.  No blood in stools No black stools.  No recent antibiotics.   Review of Systems  Constitutional: Negative for chills and fatigue.  Respiratory: Negative for cough, chest tightness, shortness of breath and wheezing.   Cardiovascular: Negative for chest pain and palpitations.  Gastrointestinal: Positive for abdominal pain and diarrhea. Negative for abdominal distention, anal bleeding, blood in stool, constipation, nausea and vomiting.  Genitourinary: Negative for decreased urine volume, difficulty urinating, frequency, penile pain, scrotal swelling and testicular pain.  Musculoskeletal: Positive for myalgias. Negative for back pain and neck stiffness.       Early on last Thursday. None now.  Skin: Negative for rash.  Neurological: Negative for dizziness, syncope, speech difficulty, weakness and headaches.  Hematological: Negative for adenopathy. Does not bruise/bleed easily.  Psychiatric/Behavioral: Negative for behavioral problems.    Past Medical History:  Diagnosis Date  . Allergic rhinitis   . Basal cell carcinoma 2016   Right medial posterior shoulder, shave and ED&C  . Decreased carotid pulse    decreased L carotid pulse (?) : Nl carotidu/s 02-2006    . ED (erectile dysfunction)   . GERD (gastroesophageal reflux disease)   . Hyperlipidemia   . Hypertension   . Sleep apnea    on CPAP     Social  History   Social History  . Marital status: Married    Spouse name: N/A  . Number of children: 2  . Years of education: N/A   Occupational History  . retired    Social History Main Topics  . Smoking status: Never Smoker  . Smokeless tobacco: Never Used  . Alcohol use Yes     Comment: less than 1 drink per day  . Drug use: No  . Sexual activity: Not on file   Other Topics Concern  . Not on file   Social History Narrative   Married. 2 sons ([place] and [place]) , 5 grandkids      Past Surgical History:  Procedure Laterality Date  . CYSTECTOMY     back, recurrent  . INGUINAL HERNIA REPAIR     right by dr. Ninfa Linden  . PILONIDAL CYST EXCISION    . TONSILLECTOMY      Family History  Problem Relation Age of Onset  . Dementia Mother   . Alcohol abuse Mother   . Coronary artery disease Father 59  . Coronary artery disease Maternal Grandfather   . Leukemia Paternal Grandfather   . Colon cancer Neg Hx   . Prostate cancer Neg Hx     No Known Allergies  Current Outpatient Prescriptions on File Prior to Visit  Medication Sig Dispense Refill  . amLODipine (NORVASC) 5 MG tablet Take 1 tablet (5 mg total) by mouth daily. 90 tablet 1  . aspirin 81 MG tablet Take 81 mg by mouth daily.      Marland Kitchen  COMBIGAN 0.2-0.5 % ophthalmic solution INSTILL 1 DROP IN BOTH EYES TWICE A DAY  3  . fenofibrate (TRICOR) 145 MG tablet Take 1 tablet (145 mg total) by mouth daily. 90 tablet 1  . losartan (COZAAR) 100 MG tablet Take 1 tablet (100 mg total) by mouth daily. 90 tablet 1  . metoprolol (LOPRESSOR) 50 MG tablet Take 1 tablet (50 mg total) by mouth 2 (two) times daily. 180 tablet 1  . ranitidine (ZANTAC) 75 MG tablet Take 75 mg by mouth 2 (two) times daily.     No current facility-administered medications on file prior to visit.     BP (!) 155/83   Pulse 74   Temp 100 F (37.8 C) (Oral)   Ht 5' 10"  (1.778 m)   Wt 243 lb 9.6 oz (110.5 kg)   SpO2 97%   BMI 34.95 kg/m       Objective:    Physical Exam  General Appearance- Not in acute distress.  HEENT Eyes- Scleraeral/Conjuntiva-bilat- Not Yellow. Mouth & Throat- Normal.  Chest and Lung Exam Auscultation: Breath sounds:-Normal. Adventitious sounds:- No Adventitious sounds.  Cardiovascular Auscultation:Rythm - Regular. Heart Sounds -Normal heart sounds.  Abdomen Inspection:-Inspection Normal.  Palpation/Perucssion: Palpation and Percussion of the abdomen reveal- Non Tender, No Rebound tenderness, No rigidity(Guarding) and No Palpable abdominal masses.  Liver:-Normal.  Spleen:- Normal.   Back- no cva tenderness.      Assessment & Plan:  Flu test done today by lpn. Result negative.  Considering viral vs bacterial cause for GI symptoms. Remote possible chance diverticulitis.  For your loose stools advise strict bland diet guidelines.  Hydrate well. Continue immodium. Tylenol for fever.  Will get cbc and cmp today.  Turn in stool panel kit tomorrow. Sooner the better.  I will make antibiotics available in event diarrhea worsens as we approach weekend. (and stool studies still pending)  Only start antibiotic  if we advise when you update [place].  Follow up in 7 days or as needed.(Please update [place] on Friday before the weekend)  Deeanna Beightol, [place]ercell Miller, [place]

## 2015-11-06 NOTE — Progress Notes (Signed)
Pre visit review using our clinic tool,if applicable. No additional management support is needed unless otherwise documented below in the visit note.  

## 2015-11-07 ENCOUNTER — Other Ambulatory Visit: Payer: Self-pay | Admitting: Medical

## 2015-11-07 DIAGNOSIS — R509 Fever, unspecified: Secondary | ICD-10-CM | POA: Diagnosis not present

## 2015-11-07 DIAGNOSIS — R197 Diarrhea, unspecified: Secondary | ICD-10-CM | POA: Diagnosis not present

## 2015-11-07 LAB — CBC WITH DIFFERENTIAL/PLATELET
BASOS ABS: 0 10*3/uL (ref 0.0–0.1)
BASOS PCT: 0.1 % (ref 0.0–3.0)
EOS ABS: 0.4 10*3/uL (ref 0.0–0.7)
Eosinophils Relative: 3.7 % (ref 0.0–5.0)
HCT: 41.6 % (ref 39.0–52.0)
HEMOGLOBIN: 14.3 g/dL (ref 13.0–17.0)
Lymphocytes Relative: 11.6 % — ABNORMAL LOW (ref 12.0–46.0)
Lymphs Abs: 1.3 10*3/uL (ref 0.7–4.0)
MCHC: 34.3 g/dL (ref 30.0–36.0)
MCV: 91.2 fl (ref 78.0–100.0)
MONO ABS: 1.4 10*3/uL — AB (ref 0.1–1.0)
Monocytes Relative: 12.6 % — ABNORMAL HIGH (ref 3.0–12.0)
NEUTROS ABS: 7.8 10*3/uL — AB (ref 1.4–7.7)
Neutrophils Relative %: 72 % (ref 43.0–77.0)
PLATELETS: 222 10*3/uL (ref 150.0–400.0)
RBC: 4.56 Mil/uL (ref 4.22–5.81)
RDW: 13.4 % (ref 11.5–15.5)
WBC: 10.8 10*3/uL — AB (ref 4.0–10.5)

## 2015-11-07 LAB — COMPREHENSIVE METABOLIC PANEL
ALT: 30 U/L (ref 0–53)
AST: 22 U/L (ref 0–37)
Albumin: 3.8 g/dL (ref 3.5–5.2)
Alkaline Phosphatase: 42 U/L (ref 39–117)
BILIRUBIN TOTAL: 0.7 mg/dL (ref 0.2–1.2)
BUN: 15 mg/dL (ref 6–23)
CHLORIDE: 103 meq/L (ref 96–112)
CO2: 28 meq/L (ref 19–32)
CREATININE: 0.93 mg/dL (ref 0.40–1.50)
Calcium: 8.7 mg/dL (ref 8.4–10.5)
GFR: 85.27 mL/min (ref >60.00)
Glucose, Bld: 91 mg/dL (ref 70–99)
Potassium: 3.8 mEq/L (ref 3.5–5.1)
Sodium: 138 mEq/L (ref 135–145)
Total Protein: 6.7 g/dL (ref 6.0–8.3)

## 2015-11-07 NOTE — Addendum Note (Signed)
Addended by: Peggyann Shoals on: 11/07/2015 08:13 AM   Modules accepted: Orders

## 2015-11-08 ENCOUNTER — Encounter: Payer: Self-pay | Admitting: Medical

## 2015-11-08 LAB — OVA AND PARASITE EXAMINATION: OP: NONE SEEN

## 2015-11-08 LAB — CLOSTRIDIUM DIFFICILE BY PCR: Toxigenic C. Difficile by PCR: NOT DETECTED

## 2015-11-08 NOTE — Telephone Encounter (Signed)
Pt states he is feeling better. He sent my chart message but wanted to talk with him directly. His pain is less. Energy better. Having less loose stools. We discussed he will hold of from taking antibiotic presently. But if worse will restart. He states he will wait another 24 hours and if not better then might start. Will inform pt as remainder of stool test come in.

## 2015-11-11 LAB — STOOL CULTURE

## 2015-11-12 DIAGNOSIS — H401131 Primary open-angle glaucoma, bilateral, mild stage: Secondary | ICD-10-CM | POA: Diagnosis not present

## 2015-11-12 DIAGNOSIS — H353121 Nonexudative age-related macular degeneration, left eye, early dry stage: Secondary | ICD-10-CM | POA: Diagnosis not present

## 2015-11-12 DIAGNOSIS — H524 Presbyopia: Secondary | ICD-10-CM | POA: Diagnosis not present

## 2015-11-12 DIAGNOSIS — H2513 Age-related nuclear cataract, bilateral: Secondary | ICD-10-CM | POA: Diagnosis not present

## 2015-11-15 DIAGNOSIS — R69 Illness, unspecified: Secondary | ICD-10-CM | POA: Diagnosis not present

## 2015-12-04 DIAGNOSIS — L720 Epidermal cyst: Secondary | ICD-10-CM | POA: Diagnosis not present

## 2015-12-04 DIAGNOSIS — L918 Other hypertrophic disorders of the skin: Secondary | ICD-10-CM | POA: Diagnosis not present

## 2015-12-04 DIAGNOSIS — L821 Other seborrheic keratosis: Secondary | ICD-10-CM | POA: Diagnosis not present

## 2015-12-04 DIAGNOSIS — Z85828 Personal history of other malignant neoplasm of skin: Secondary | ICD-10-CM | POA: Diagnosis not present

## 2015-12-04 DIAGNOSIS — D2372 Other benign neoplasm of skin of left lower limb, including hip: Secondary | ICD-10-CM | POA: Diagnosis not present

## 2015-12-04 DIAGNOSIS — D1801 Hemangioma of skin and subcutaneous tissue: Secondary | ICD-10-CM | POA: Diagnosis not present

## 2015-12-04 DIAGNOSIS — L738 Other specified follicular disorders: Secondary | ICD-10-CM | POA: Diagnosis not present

## 2015-12-18 ENCOUNTER — Other Ambulatory Visit: Payer: Self-pay | Admitting: Internal Medicine

## 2016-01-07 DIAGNOSIS — G4733 Obstructive sleep apnea (adult) (pediatric): Secondary | ICD-10-CM | POA: Diagnosis not present

## 2016-01-15 ENCOUNTER — Other Ambulatory Visit: Payer: Self-pay | Admitting: Internal Medicine

## 2016-01-29 DIAGNOSIS — L723 Sebaceous cyst: Secondary | ICD-10-CM | POA: Diagnosis not present

## 2016-01-29 DIAGNOSIS — L02212 Cutaneous abscess of back [any part, except buttock]: Secondary | ICD-10-CM | POA: Diagnosis not present

## 2016-03-08 ENCOUNTER — Encounter: Payer: Self-pay | Admitting: Pulmonary Disease

## 2016-03-10 ENCOUNTER — Encounter: Payer: Self-pay | Admitting: Pulmonary Disease

## 2016-03-10 ENCOUNTER — Ambulatory Visit (INDEPENDENT_AMBULATORY_CARE_PROVIDER_SITE_OTHER): Payer: Medicare HMO | Admitting: Pulmonary Disease

## 2016-03-10 DIAGNOSIS — G4733 Obstructive sleep apnea (adult) (pediatric): Secondary | ICD-10-CM | POA: Diagnosis not present

## 2016-03-10 DIAGNOSIS — I10 Essential (primary) hypertension: Secondary | ICD-10-CM | POA: Diagnosis not present

## 2016-03-10 NOTE — Progress Notes (Signed)
   Subjective:    Patient ID: Jeffery Cline, male    DOB: [birth year], 71 y.o.   MRN: [id number]  HPI  71 year old, overweight hypertensive  for FU of OSA. Home sleep study 07/2014 showed moderate OSA with AHI 27/hour and lowest desaturation of 79%. Total sleep time was 8 hours.  He reports breakthrough GERD symptoms causing wheezing and cough He is a lifetime never smoker  12/2014 started on CPAP therapy on 8 cm   03/10/2016  Chief Complaint  Patient presents with  . Follow-up    1 year f/u for CPAP. Breathing has great since last visit. Has been using CPAP every night. Sleeping has improved.    Annual FU He had excellent results with CPAP and reports improvement in his daytime somnolent, has more energy and fatigue is resolved He continues to complain of low libido but has made his peace with this now  Supplies have been obtained on time and is able to keep his equipment clean. Reports good usage of CPAP Download confirms this and shows no residual events on CPAP of 8 cm. He does have mild leak on his full face mask but this does not seem to bother him or does not wake up his wife Unfortunately has been unable to lose weight No snoring has been noted by his wife He remains on 3 blood pressure medications but blood pressure is better controlled  Review of Systems Patient denies significant dyspnea,cough, hemoptysis,  chest pain, palpitations, pedal edema, orthopnea, paroxysmal nocturnal dyspnea, lightheadedness, nausea, vomiting, abdominal or  leg pains      Objective:   Physical Exam  Gen. Pleasant, obese, in no distress ENT - no lesions, no post nasal drip Neck: No JVD, no thyromegaly, no carotid bruits Lungs: no use of accessory muscles, no dullness to percussion, decreased without rales or rhonchi  Cardiovascular: Rhythm regular, heart sounds  normal, no murmurs or gallops, no peripheral edema Musculoskeletal: No deformities, no cyanosis or clubbing , no  tremors       Assessment & Plan:

## 2016-03-10 NOTE — Patient Instructions (Signed)
Your CPAP is set at 8 cm CPAP supplies will be renewed for a year

## 2016-03-10 NOTE — Assessment & Plan Note (Signed)
Blood pressure well controlled Recommend empiric decrease in metoprolol dose to see if this helps  libido

## 2016-03-10 NOTE — Assessment & Plan Note (Signed)
CPAP is set at 8 cm CPAP supplies will be renewed for a year  Weight loss encouraged, compliance with goal of at least 4-6 hrs every night is the expectation. Advised against medications with sedative side effects Cautioned against driving when sleepy - understanding that sleepiness will vary on a day to day basis

## 2016-03-26 ENCOUNTER — Encounter: Payer: Self-pay | Admitting: Internal Medicine

## 2016-04-08 ENCOUNTER — Encounter: Payer: Self-pay | Admitting: Internal Medicine

## 2016-04-13 ENCOUNTER — Other Ambulatory Visit: Payer: Self-pay | Admitting: Internal Medicine

## 2016-04-20 ENCOUNTER — Encounter: Payer: Self-pay | Admitting: Internal Medicine

## 2016-05-12 DIAGNOSIS — H2513 Age-related nuclear cataract, bilateral: Secondary | ICD-10-CM | POA: Diagnosis not present

## 2016-05-12 DIAGNOSIS — H353121 Nonexudative age-related macular degeneration, left eye, early dry stage: Secondary | ICD-10-CM | POA: Diagnosis not present

## 2016-05-12 DIAGNOSIS — H524 Presbyopia: Secondary | ICD-10-CM | POA: Diagnosis not present

## 2016-05-12 DIAGNOSIS — H401131 Primary open-angle glaucoma, bilateral, mild stage: Secondary | ICD-10-CM | POA: Diagnosis not present

## 2016-05-18 NOTE — Progress Notes (Signed)
Subjective:   Jeffery Cline is a 71 y.o. male who presents for Medicare Annual/Subsequent preventive examination.  Review of Systems:  No ROS.  Medicare Wellness Visit.   Sleep patterns: Wears CPAP. Wakes 2-3x/ to urinate. Sleeps about 8-9 hrs/ night.  Home Safety/Smoke Alarms:  Feels safe in home. Smoke alarms in place.  Living environment; residence and Firearm Safety: Lives in [place] with wife on 1st floor. Guns safely stored. Seat Belt Safety/Bike Helmet: Wears seat belt.   Counseling:   Eye Exam- Wears reading glasses. Dr.Digby every 6 months. Dental- Dr.Thomas every 6 months.  Male:   CCS-Last 04/14/11: 2 diminutive adenomas and one hyperplastic Repeat colonoscopy 5 years per report . Pt states he will schedule appt. PSA-  Lab Results  Component Value Date   PSA 1.48 04/04/2014   PSA 1.44 03/06/2013   PSA 1.45 03/01/2012        Objective:    Vitals: BP (!) 158/80 (BP Location: Right Arm, Patient Position: Sitting, Cuff Size: Large)   Pulse (!) 56   Ht 5\' 10"  (1.778 m)   Wt 242 lb (109.8 kg)   SpO2 98%   BMI 34.72 kg/m   Body mass index is 34.72 kg/m.  Tobacco History  Smoking Status  . Never Smoker  Smokeless Tobacco  . Never Used     Counseling given: Not Answered   Past Medical History:  Diagnosis Date  . Allergic rhinitis   . Basal cell carcinoma 2016   Right medial posterior shoulder, shave and ED&C  . Decreased carotid pulse    decreased L carotid pulse (?) : Nl carotidu/s 02-2006    . ED (erectile dysfunction)   . GERD (gastroesophageal reflux disease)   . Hyperlipidemia   . Hypertension   . Sleep apnea    on CPAP   Past Surgical History:  Procedure Laterality Date  . CYSTECTOMY     back, recurrent  . INGUINAL HERNIA REPAIR     right by dr. Ninfa Linden  . PILONIDAL CYST EXCISION    . TONSILLECTOMY     Family History  Problem Relation Age of Onset  . Dementia Mother   . Alcohol abuse Mother   . Coronary artery disease Father  9  . Coronary artery disease Maternal Grandfather   . Leukemia Paternal Grandfather   . Colon cancer Neg Hx   . Prostate cancer Neg Hx    History  Sexual Activity  . Sexual activity: No    Outpatient Encounter Prescriptions as of 05/19/2016  Medication Sig  . amLODipine (NORVASC) 5 MG tablet Take 1 tablet (5 mg total) by mouth daily.  Marland Kitchen aspirin 81 MG tablet Take 81 mg by mouth daily.    . COMBIGAN 0.2-0.5 % ophthalmic solution INSTILL 1 DROP IN BOTH EYES TWICE A DAY  . fenofibrate (TRICOR) 145 MG tablet Take 1 tablet (145 mg total) by mouth daily.  Marland Kitchen losartan (COZAAR) 100 MG tablet Take 1 tablet (100 mg total) by mouth daily.  . metoprolol (LOPRESSOR) 50 MG tablet Take 1 tablet (50 mg total) by mouth 2 (two) times daily.  . ranitidine (ZANTAC) 75 MG tablet Take 75 mg by mouth 2 (two) times daily.   No facility-administered encounter medications on file as of 05/19/2016.     Activities of Daily Living In your present state of health, do you have any difficulty performing the following activities: 05/19/2016  Hearing? N  Vision? N  Difficulty concentrating or making decisions? N  Walking or  climbing stairs? N  Dressing or bathing? N  Doing errands, shopping? N  Preparing Food and eating ? N  Using the Toilet? N  In the past six months, have you accidently leaked urine? N  Do you have problems with loss of bowel control? N  Managing your Medications? N  Managing your Finances? N  Housekeeping or managing your Housekeeping? N  Some recent data might be hidden    Patient Care Team: Colon Branch, MD as PCP - General Donnie Mesa, MD as Consulting Physician (General Surgery) Calvert Cantor, MD as Consulting Physician (Ophthalmology) Danella Sensing, MD as Consulting Physician (Dermatology)   Assessment:    Physical assessment deferred to PCP.  Exercise Activities and Dietary recommendations Current Exercise Habits: Structured exercise class, Type of exercise: treadmill;strength  training/weights, Time (Minutes): 60, Frequency (Times/Week): 5, Weekly Exercise (Minutes/Week): 300, Intensity: Mild   Diet (meal preparation, eat out, water intake, caffeinated beverages, dairy products, fruits and vegetables): in general, a "healthy" diet  , well balanced  Drinks 2-3 glasses of water per day.   Goals      Patient Stated   . Drink at least 4 glasses of water per day. (pt-stated)      Other   . Weight (lb) < 220 lb (99.8 kg)      Fall Risk Fall Risk  04/29/2015 04/02/2014 03/06/2013 03/01/2012  Falls in the past year? No No No No   Depression Screen PHQ 2/9 Scores 04/29/2015 04/02/2014 03/06/2013 03/01/2012  PHQ - 2 Score 0 0 0 0    Cognitive Function Ad8 score reviewed for issues:  Issues making decisions:no  Less interest in hobbies / activities:no  Repeats questions, stories (family complaining):no  Trouble using ordinary gadgets (microwave, computer, phone):no  Forgets the month or year: no  Mismanaging finances: no  Remembering appts:no  Daily problems with thinking and/or memory:no Ad8 score is=0            Immunization History  Administered Date(s) Administered  . Influenza Split 11/02/2013  . Influenza Whole 11/23/2007, 10/09/2009, 11/09/2012  . Influenza,inj,Quad PF,36+ Mos 12/06/2014  . Influenza-Unspecified 11/15/2015  . Pneumococcal Conjugate-13 04/02/2014  . Pneumococcal Polysaccharide-23 01/20/2011  . Td 06/07/1998, 09/18/2008  . Zoster 02/21/2008   Screening Tests Health Maintenance  Topic Date Due  . Hepatitis C Screening  [birth year]  . COLONOSCOPY  04/13/2016  . INFLUENZA VACCINE  09/09/2016  . TETANUS/TDAP  09/19/2018  . PNA vac Low Risk Adult  Completed      Plan:     Follow up with Dr.Paz today as scheduled.  Schedule colonoscopy as discussed.  Continue to eat heart healthy diet (full of fruits, vegetables, whole grains, lean protein, water--limit salt, fat, and sugar intake) and increase physical activity as  tolerated.  Continue doing brain stimulating activities (puzzles, reading, adult coloring books, staying active) to keep memory sharp.   During the course of the visit the patient was educated and counseled about the following appropriate screening and preventive services:   Vaccines to include Pneumoccal, Influenza, Td, HCV  Cardiovascular Disease  Colorectal cancer screening  Diabetes screening  Prostate Cancer Screening  Glaucoma screening  Nutrition counseling  Patient Instructions (the written plan) was given to the patient.    Naaman Plummer [place], [place]  05/19/2016  Kathlene November, MD

## 2016-05-18 NOTE — Progress Notes (Signed)
Pre visit review using our clinic review tool, if applicable. No additional management support is needed unless otherwise documented below in the visit note. 

## 2016-05-19 ENCOUNTER — Encounter: Payer: Self-pay | Admitting: Internal Medicine

## 2016-05-19 ENCOUNTER — Ambulatory Visit (INDEPENDENT_AMBULATORY_CARE_PROVIDER_SITE_OTHER): Payer: Medicare HMO | Admitting: Internal Medicine

## 2016-05-19 VITALS — BP 158/80 | HR 56 | Ht 70.0 in | Wt 242.0 lb

## 2016-05-19 DIAGNOSIS — Z1159 Encounter for screening for other viral diseases: Secondary | ICD-10-CM

## 2016-05-19 DIAGNOSIS — N401 Enlarged prostate with lower urinary tract symptoms: Secondary | ICD-10-CM

## 2016-05-19 DIAGNOSIS — Z0000 Encounter for general adult medical examination without abnormal findings: Secondary | ICD-10-CM

## 2016-05-19 DIAGNOSIS — E785 Hyperlipidemia, unspecified: Secondary | ICD-10-CM

## 2016-05-19 DIAGNOSIS — I10 Essential (primary) hypertension: Secondary | ICD-10-CM

## 2016-05-19 NOTE — Assessment & Plan Note (Addendum)
--  Td 09-2008;pneumonia 2012 ;Prevnar 2016; Zostavax 02-2008 --CCS Colonoscopy:   Date:  01/09/2002, normal Colonoscopy 04/2011, 2 polyps, Dr. Carlean Purl, next due, got a letter, plans to proceed this year  --Prostate cancer screening: DRE slightly enlarged prostate, checking labs. --diet-exercise discussed   --Labs: CMP, FLP, CBC, PSA, UA, urine culture, hepatitis C, A1c.

## 2016-05-19 NOTE — Progress Notes (Signed)
Subjective:    Patient ID: Jeffery Cline, male    DOB: February 27, 1945, 71 y.o.   MRN: [id number]  DOS:  05/19/2016 Type of visit - description : cpx Interval history: Other issues also discussed    Review of Systems In general feeling well, no major concerns. He remains active, going to the gym doing some lifting. Occasional aches in the shoulders -elbows but nothing persistent or severe Decreased libido continue, some ED, not interested on treatment at this point. BP today slightly elevated, normal home. Nocturia 2 or 3 times a night, chronic issue, denies dysuria, gross hematuria difficulty urinating.   Other than above, a 14 point review of systems is negative     Past Medical History:  Diagnosis Date  . Allergic rhinitis   . Basal cell carcinoma 2016   Right medial posterior shoulder, shave and ED&C  . Decreased carotid pulse    decreased L carotid pulse (?) : Nl carotidu/s 02-2006    . ED (erectile dysfunction)   . GERD (gastroesophageal reflux disease)   . Hyperlipidemia   . Hypertension   . Sleep apnea    on CPAP    Past Surgical History:  Procedure Laterality Date  . CYSTECTOMY     back, recurrent, last 01-2016  . INGUINAL HERNIA REPAIR     right by dr. Ninfa Linden  . PILONIDAL CYST EXCISION    . TONSILLECTOMY      Social History   Social History  . Marital status: Married    Spouse name: N/A  . Number of children: 2  . Years of education: N/A   Occupational History  . retired    Social History Main Topics  . Smoking status: Never Smoker  . Smokeless tobacco: Never Used  . Alcohol use Yes     Comment: less than 1 drink per day  . Drug use: No  . Sexual activity: No   Other Topics Concern  . Not on file   Social History Narrative   Married. 2 sons ([place] and [place]) , 5 grandkids       Family History  Problem Relation Age of Onset  . Dementia Mother   . Alcohol abuse Mother   . Coronary artery disease Father 15  . Coronary artery disease  Maternal Grandfather   . Leukemia Paternal Grandfather   . Colon cancer Neg Hx   . Prostate cancer Neg Hx      Allergies as of 05/19/2016   No Known Allergies     Medication List       Accurate as of 05/19/16 11:59 PM. Always use your most recent med list.          amLODipine 5 MG tablet Commonly known as:  NORVASC Take 1 tablet (5 mg total) by mouth daily.   aspirin 81 MG tablet Take 81 mg by mouth daily.   COMBIGAN 0.2-0.5 % ophthalmic solution Generic drug:  brimonidine-timolol INSTILL 1 DROP IN BOTH EYES TWICE A DAY   fenofibrate 145 MG tablet Commonly known as:  TRICOR Take 1 tablet (145 mg total) by mouth daily.   losartan 100 MG tablet Commonly known as:  COZAAR Take 1 tablet (100 mg total) by mouth daily.   metoprolol 50 MG tablet Commonly known as:  LOPRESSOR Take 1 tablet (50 mg total) by mouth 2 (two) times daily.   ranitidine 75 MG tablet Commonly known as:  ZANTAC Take 75 mg by mouth 2 (two) times daily.  Objective:   Physical Exam BP (!) 158/80 (BP Location: Right Arm, Patient Position: Sitting, Cuff Size: Large)   Pulse (!) 56   Ht 5\' 10"  (1.778 m)   Wt 242 lb (109.8 kg)   SpO2 98%   BMI 34.72 kg/m   General:   Well developed, well nourished . NAD.  Neck: No  thyromegaly  HEENT:  Normocephalic . Face symmetric, atraumatic Lungs:  CTA B Normal respiratory effort, no intercostal retractions, no accessory muscle use. Heart: RRR,  no murmur.  No pretibial edema bilaterally  Abdomen:  Not distended, soft, non-tender. No rebound or rigidity.   Skin: Exposed areas without rash. Not pale. Not jaundice Rectal:  External abnormalities: none. Normal sphincter tone. No rectal masses or tenderness.  Stool brown  Prostate: Prostate gland firm and smooth , mild enlargement but no nodularity, tenderness, mass, asymmetry or induration.  Neurologic:  alert & oriented X3.  Speech normal, gait appropriate for age and unassisted Strength  symmetric and appropriate for age.  Psych: Cognition and judgment appear intact.  Cooperative with normal attention span and concentration.  Behavior appropriate. No anxious or depressed appearing.    Assessment & Plan:   Assessment Prediabetes  Morbid obesity HTN Hyperlipidemia Low testosterone: Saw endocrinology 06/2014 , mild hypogonadotropic hypogonadism. no rx recommended  Decreased libido , ED: not interested in Rx Low iron 2016: rechecked >> normal  OSA -- on CPAP , rx Dr Elsworth Soho GERD Decreased L carotid pulse? Normal carotid ultrasound 2008 [facility] 2016- sees derm q year Dr Ronnald Ramp  Glaucoma   BPH  PLAN: Prediabetes: Check A1c HTN: BP today slightly elevated, recommend ambulatory BPs. Continue amlodipine, losartan, metoprolol. Hyperlipidemia: On fenofibrate, check labs BPH: Prostate is slightly enlarged, some LUTS, mostly nocturia. Get a UA, urine culture and PSA. RTC 6 months

## 2016-05-19 NOTE — Patient Instructions (Addendum)
    GO TO THE FRONT DESK Schedule your next appointment for a  routine checkup in 6 months  Schedule labs to be done fasting at your earliest convenience    Check the  blood pressure 2 or 3 times a   week   Be sure your blood pressure is between 110/65 and  145/85. If it is consistently higher or lower, let me know      Schedule colonoscopy as discussed.  Continue to eat heart healthy diet (full of fruits, vegetables, whole grains, lean protein, water--limit salt, fat, and sugar intake) and increase physical activity as tolerated.  Continue doing brain stimulating activities (puzzles, reading, adult coloring books, staying active) to keep memory sharp.

## 2016-05-20 ENCOUNTER — Other Ambulatory Visit (INDEPENDENT_AMBULATORY_CARE_PROVIDER_SITE_OTHER): Payer: Medicare HMO

## 2016-05-20 DIAGNOSIS — I10 Essential (primary) hypertension: Secondary | ICD-10-CM | POA: Diagnosis not present

## 2016-05-20 DIAGNOSIS — Z1159 Encounter for screening for other viral diseases: Secondary | ICD-10-CM

## 2016-05-20 DIAGNOSIS — E785 Hyperlipidemia, unspecified: Secondary | ICD-10-CM | POA: Diagnosis not present

## 2016-05-20 DIAGNOSIS — N401 Enlarged prostate with lower urinary tract symptoms: Secondary | ICD-10-CM

## 2016-05-20 LAB — URINALYSIS, ROUTINE W REFLEX MICROSCOPIC
Bilirubin Urine: NEGATIVE
HGB URINE DIPSTICK: NEGATIVE
KETONES UR: NEGATIVE
Leukocytes, UA: NEGATIVE
NITRITE: NEGATIVE
RBC / HPF: NONE SEEN (ref 0–?)
Specific Gravity, Urine: 1.02 (ref 1.000–1.030)
Total Protein, Urine: NEGATIVE
URINE GLUCOSE: NEGATIVE
UROBILINOGEN UA: 0.2 (ref 0.0–1.0)
pH: 5.5 (ref 5.0–8.0)

## 2016-05-20 LAB — COMPREHENSIVE METABOLIC PANEL
ALBUMIN: 4.1 g/dL (ref 3.5–5.2)
ALK PHOS: 42 U/L (ref 39–117)
ALT: 39 U/L (ref 0–53)
AST: 24 U/L (ref 0–37)
BILIRUBIN TOTAL: 0.7 mg/dL (ref 0.2–1.2)
BUN: 15 mg/dL (ref 6–23)
CALCIUM: 9.1 mg/dL (ref 8.4–10.5)
CHLORIDE: 105 meq/L (ref 96–112)
CO2: 26 mEq/L (ref 19–32)
CREATININE: 0.98 mg/dL (ref 0.40–1.50)
GFR: 80.15 mL/min (ref >60.00)
Glucose, Bld: 131 mg/dL — ABNORMAL HIGH (ref 70–99)
Potassium: 3.9 mEq/L (ref 3.5–5.1)
SODIUM: 138 meq/L (ref 135–145)
TOTAL PROTEIN: 6.5 g/dL (ref 6.0–8.3)

## 2016-05-20 LAB — CBC WITH DIFFERENTIAL/PLATELET
Basophils Absolute: 0.1 10*3/uL (ref 0.0–0.1)
Basophils Relative: 0.8 % (ref 0.0–3.0)
EOS PCT: 6.4 % — AB (ref 0.0–5.0)
Eosinophils Absolute: 0.7 10*3/uL (ref 0.0–0.7)
HEMATOCRIT: 43.6 % (ref 39.0–52.0)
Hemoglobin: 15 g/dL (ref 13.0–17.0)
LYMPHS ABS: 2.7 10*3/uL (ref 0.7–4.0)
LYMPHS PCT: 25.8 % (ref 12.0–46.0)
MCHC: 34.4 g/dL (ref 30.0–36.0)
MCV: 91.2 fl (ref 78.0–100.0)
MONOS PCT: 10.7 % (ref 3.0–12.0)
Monocytes Absolute: 1.1 10*3/uL — ABNORMAL HIGH (ref 0.1–1.0)
NEUTROS ABS: 6 10*3/uL (ref 1.4–7.7)
NEUTROS PCT: 56.3 % (ref 43.0–77.0)
PLATELETS: 188 10*3/uL (ref 150.0–400.0)
RBC: 4.78 Mil/uL (ref 4.22–5.81)
RDW: 13.7 % (ref 11.5–15.5)
WBC: 10.6 10*3/uL — ABNORMAL HIGH (ref 4.0–10.5)

## 2016-05-20 LAB — PSA: PSA: 1.5 ng/mL (ref 0.10–4.00)

## 2016-05-20 LAB — LDL CHOLESTEROL, DIRECT: LDL DIRECT: 121 mg/dL

## 2016-05-20 LAB — LIPID PANEL
CHOL/HDL RATIO: 6
CHOLESTEROL: 189 mg/dL (ref 0–200)
HDL: 29.2 mg/dL — ABNORMAL LOW (ref >39.00)
NonHDL: 159.3
TRIGLYCERIDES: 252 mg/dL — AB (ref 0.0–149.0)
VLDL: 50.4 mg/dL — ABNORMAL HIGH (ref 0.0–40.0)

## 2016-05-20 LAB — HEMOGLOBIN A1C: Hgb A1c MFr Bld: 6.5 % (ref 4.6–6.5)

## 2016-05-20 LAB — HEPATITIS C ANTIBODY: HCV Ab: NEGATIVE

## 2016-05-20 NOTE — Assessment & Plan Note (Signed)
Prediabetes: Check A1c HTN: BP today slightly elevated, recommend ambulatory BPs. Continue amlodipine, losartan, metoprolol. Hyperlipidemia: On fenofibrate, check labs BPH: Prostate is slightly enlarged, some LUTS, mostly nocturia. Get a UA, urine culture and PSA. RTC 6 months

## 2016-05-21 LAB — URINE CULTURE: Organism ID, Bacteria: NO GROWTH

## 2016-06-01 DIAGNOSIS — G4733 Obstructive sleep apnea (adult) (pediatric): Secondary | ICD-10-CM | POA: Diagnosis not present

## 2016-06-11 ENCOUNTER — Other Ambulatory Visit: Payer: Self-pay | Admitting: Internal Medicine

## 2016-07-07 DIAGNOSIS — G4733 Obstructive sleep apnea (adult) (pediatric): Secondary | ICD-10-CM | POA: Diagnosis not present

## 2016-07-12 ENCOUNTER — Other Ambulatory Visit: Payer: Self-pay | Admitting: Internal Medicine

## 2016-11-17 DIAGNOSIS — H401131 Primary open-angle glaucoma, bilateral, mild stage: Secondary | ICD-10-CM | POA: Diagnosis not present

## 2016-11-17 DIAGNOSIS — H353121 Nonexudative age-related macular degeneration, left eye, early dry stage: Secondary | ICD-10-CM | POA: Diagnosis not present

## 2016-11-17 DIAGNOSIS — H524 Presbyopia: Secondary | ICD-10-CM | POA: Diagnosis not present

## 2016-11-17 DIAGNOSIS — H2513 Age-related nuclear cataract, bilateral: Secondary | ICD-10-CM | POA: Diagnosis not present

## 2016-11-18 ENCOUNTER — Encounter: Payer: Self-pay | Admitting: Internal Medicine

## 2016-11-18 ENCOUNTER — Ambulatory Visit (INDEPENDENT_AMBULATORY_CARE_PROVIDER_SITE_OTHER): Payer: Medicare HMO | Admitting: Internal Medicine

## 2016-11-18 VITALS — BP 128/78 | HR 57 | Temp 97.5°F | Resp 14 | Ht 70.0 in | Wt 248.2 lb

## 2016-11-18 DIAGNOSIS — E785 Hyperlipidemia, unspecified: Secondary | ICD-10-CM

## 2016-11-18 DIAGNOSIS — K219 Gastro-esophageal reflux disease without esophagitis: Secondary | ICD-10-CM

## 2016-11-18 DIAGNOSIS — I10 Essential (primary) hypertension: Secondary | ICD-10-CM

## 2016-11-18 DIAGNOSIS — E118 Type 2 diabetes mellitus with unspecified complications: Secondary | ICD-10-CM

## 2016-11-18 DIAGNOSIS — Z23 Encounter for immunization: Secondary | ICD-10-CM

## 2016-11-18 LAB — BASIC METABOLIC PANEL
BUN: 17 mg/dL (ref 6–23)
CO2: 28 meq/L (ref 19–32)
Calcium: 9.5 mg/dL (ref 8.4–10.5)
Chloride: 103 mEq/L (ref 96–112)
Creatinine, Ser: 0.93 mg/dL (ref 0.40–1.50)
GFR: 85.02 mL/min (ref >60.00)
GLUCOSE: 116 mg/dL — AB (ref 70–99)
POTASSIUM: 3.8 meq/L (ref 3.5–5.1)
Sodium: 138 mEq/L (ref 135–145)

## 2016-11-18 LAB — HEMOGLOBIN A1C: Hgb A1c MFr Bld: 6.7 % — ABNORMAL HIGH (ref 4.6–6.5)

## 2016-11-18 NOTE — Patient Instructions (Signed)
GO TO THE LAB : Get the blood work     GO TO THE FRONT DESK Schedule your next appointment for a  yearly checkup by April 2019

## 2016-11-18 NOTE — Progress Notes (Signed)
Subjective:    Patient ID: Jeffery Cline, male    DOB: 09/02/1945, 71 y.o.   MRN: [id number]  DOS:  11/18/2016 Type of visit - description : rov Interval history: No major concerns HTN: Good compliance of medication Dyslipidemia, on TriCor, last LDL slightly elevated DM: Diet control, previous A1c is reviewed with the patient GERD: was taking Zantac consistently, he felt that it was causing anxiety, switched to omeprazole and feels great.   Review of Systems  No chest pain or difficulty breathing No depression  Past Medical History:  Diagnosis Date  . Allergic rhinitis   . Basal cell carcinoma 2016   Right medial posterior shoulder, shave and ED&C  . Decreased carotid pulse    decreased L carotid pulse (?) : Nl carotidu/s 02-2006    . ED (erectile dysfunction)   . GERD (gastroesophageal reflux disease)   . Hyperlipidemia   . Hypertension   . Sleep apnea    on CPAP    Past Surgical History:  Procedure Laterality Date  . CYSTECTOMY     back, recurrent, last 01-2016  . INGUINAL HERNIA REPAIR     right by dr. Ninfa Linden  . PILONIDAL CYST EXCISION    . TONSILLECTOMY      Social History   Social History  . Marital status: Married    Spouse name: N/A  . Number of children: 2  . Years of education: N/A   Occupational History  . retired    Social History Main Topics  . Smoking status: Never Smoker  . Smokeless tobacco: Never Used  . Alcohol use Yes     Comment: less than 1 drink per day  . Drug use: No  . Sexual activity: No   Other Topics Concern  . Not on file   Social History Narrative   Married. 2 sons ([place] and [place]) , 5 grandkids        Allergies as of 11/18/2016   No Known Allergies     Medication List       Accurate as of 11/18/16 11:59 PM. Always use your most recent med list.          amLODipine 5 MG tablet Commonly known as:  NORVASC Take 1 tablet (5 mg total) by mouth daily.   aspirin 81 MG tablet Take 81 mg by mouth  daily.   COMBIGAN 0.2-0.5 % ophthalmic solution Generic drug:  brimonidine-timolol INSTILL 1 DROP IN BOTH EYES TWICE A DAY   fenofibrate 145 MG tablet Commonly known as:  TRICOR Take 1 tablet (145 mg total) by mouth daily.   losartan 100 MG tablet Commonly known as:  COZAAR Take 1 tablet (100 mg total) by mouth daily.   metoprolol tartrate 50 MG tablet Commonly known as:  LOPRESSOR Take 1 tablet (50 mg total) by mouth 2 (two) times daily.   omeprazole 20 MG tablet Commonly known as:  PRILOSEC OTC Take 20 mg by mouth daily.          Objective:   Physical Exam BP 128/78 (BP Location: Left Arm, Patient Position: Sitting, Cuff Size: Normal)   Pulse (!) 57   Temp (!) 97.5 F (36.4 C) (Oral)   Resp 14   Ht 5\' 10"  (1.778 m)   Wt 248 lb 4 oz (112.6 kg)   SpO2 97%   BMI 35.62 kg/m  General:   Well developed, well nourished . NAD.  HEENT:  Normocephalic . Face symmetric, atraumatic Lungs:  CTA B Normal respiratory  effort, no intercostal retractions, no accessory muscle use. Heart: RRR,  no murmur.  Trace pretibial edema bilaterally  Skin: Not pale. Not jaundice Neurologic:  alert & oriented X3.  Speech normal, gait appropriate for age and unassisted Psych--  Cognition and judgment appear intact.  Cooperative with normal attention span and concentration.  Behavior appropriate. No anxious or depressed appearing.      Assessment & Plan:   Assessment DM  A1c 6.5 (05-2016)  Morbid obesity HTN Hyperlipidemia Low testosterone: Saw endocrinology 06/2014 , mild hypogonadotropic hypogonadism. no rx recommended  Decreased libido , ED: not interested in Rx Low iron 2016: rechecked >> normal  OSA -- on CPAP , rx Dr Elsworth Soho GERD Decreased L carotid pulse? Normal carotid ultrasound 2008 [place] 2016- sees derm q year Dr Ronnald Ramp  Glaucoma   BPH  PLAN: DM: Last A1c 6.5, diet and exercise discussed today, check A1c HTN: BP very good today, continue amlodipine, losartan,  metoprolol. Check a BMP Hyperlipidemia: On TriCor last LDL 121, slightly higher than before, watch diet. Elevated  WBC: See previous CBCs, minimal elevation, recheck on RTC GERD: Self d/c Zantac (causing anxiety?), now on  omeprazole OTC and doing well Flu shot today RTC 4-19 CPX

## 2016-11-18 NOTE — Progress Notes (Signed)
Pre visit review using our clinic review tool, if applicable. No additional management support is needed unless otherwise documented below in the visit note. 

## 2016-11-19 NOTE — Assessment & Plan Note (Signed)
DM: Last A1c 6.5, diet and exercise discussed today, check A1c HTN: BP very good today, continue amlodipine, losartan, metoprolol. Check a BMP Hyperlipidemia: On TriCor last LDL 121, slightly higher than before, watch diet. Elevated  WBC: See previous CBCs, minimal elevation, recheck on RTC GERD: Self d/c Zantac (causing anxiety?), now on  omeprazole OTC and doing well Flu shot today RTC 4-19 CPX

## 2016-12-01 DIAGNOSIS — G4733 Obstructive sleep apnea (adult) (pediatric): Secondary | ICD-10-CM | POA: Diagnosis not present

## 2016-12-03 DIAGNOSIS — L821 Other seborrheic keratosis: Secondary | ICD-10-CM | POA: Diagnosis not present

## 2016-12-03 DIAGNOSIS — D2372 Other benign neoplasm of skin of left lower limb, including hip: Secondary | ICD-10-CM | POA: Diagnosis not present

## 2016-12-03 DIAGNOSIS — D1801 Hemangioma of skin and subcutaneous tissue: Secondary | ICD-10-CM | POA: Diagnosis not present

## 2016-12-03 DIAGNOSIS — L738 Other specified follicular disorders: Secondary | ICD-10-CM | POA: Diagnosis not present

## 2016-12-03 DIAGNOSIS — Z85828 Personal history of other malignant neoplasm of skin: Secondary | ICD-10-CM | POA: Diagnosis not present

## 2016-12-03 DIAGNOSIS — L720 Epidermal cyst: Secondary | ICD-10-CM | POA: Diagnosis not present

## 2016-12-22 ENCOUNTER — Encounter: Payer: Self-pay | Admitting: Internal Medicine

## 2017-01-04 ENCOUNTER — Other Ambulatory Visit: Payer: Self-pay | Admitting: Internal Medicine

## 2017-01-08 DIAGNOSIS — G4733 Obstructive sleep apnea (adult) (pediatric): Secondary | ICD-10-CM | POA: Diagnosis not present

## 2017-02-10 ENCOUNTER — Other Ambulatory Visit: Payer: Self-pay

## 2017-02-10 ENCOUNTER — Ambulatory Visit (AMBULATORY_SURGERY_CENTER): Payer: Self-pay | Admitting: *Deleted

## 2017-02-10 VITALS — Ht 70.0 in | Wt 244.0 lb

## 2017-02-10 DIAGNOSIS — Z86010 Personal history of colonic polyps: Secondary | ICD-10-CM

## 2017-02-10 NOTE — Progress Notes (Signed)
No egg or soy allergy known to patient  No issues with past sedation with any surgeries  or procedures, no intubation problems  No diet pills per patient No home 02 use per patient  No blood thinners per patient  Pt denies issues with constipation  No A fib or A flutter  EMMI video sent to pt's e mail pt declined   

## 2017-02-17 ENCOUNTER — Encounter: Payer: Self-pay | Admitting: Internal Medicine

## 2017-02-23 DIAGNOSIS — H353121 Nonexudative age-related macular degeneration, left eye, early dry stage: Secondary | ICD-10-CM | POA: Diagnosis not present

## 2017-02-23 DIAGNOSIS — H524 Presbyopia: Secondary | ICD-10-CM | POA: Diagnosis not present

## 2017-02-23 DIAGNOSIS — H2513 Age-related nuclear cataract, bilateral: Secondary | ICD-10-CM | POA: Diagnosis not present

## 2017-02-23 DIAGNOSIS — H401131 Primary open-angle glaucoma, bilateral, mild stage: Secondary | ICD-10-CM | POA: Diagnosis not present

## 2017-02-24 ENCOUNTER — Encounter: Payer: Self-pay | Admitting: Internal Medicine

## 2017-02-24 ENCOUNTER — Other Ambulatory Visit: Payer: Self-pay

## 2017-02-24 ENCOUNTER — Ambulatory Visit (AMBULATORY_SURGERY_CENTER): Payer: Medicare HMO | Admitting: Internal Medicine

## 2017-02-24 VITALS — BP 112/58 | HR 66 | Temp 97.3°F | Resp 16 | Ht 70.0 in | Wt 244.0 lb

## 2017-02-24 DIAGNOSIS — D126 Benign neoplasm of colon, unspecified: Secondary | ICD-10-CM

## 2017-02-24 DIAGNOSIS — D122 Benign neoplasm of ascending colon: Secondary | ICD-10-CM | POA: Diagnosis not present

## 2017-02-24 DIAGNOSIS — D120 Benign neoplasm of cecum: Secondary | ICD-10-CM | POA: Diagnosis not present

## 2017-02-24 DIAGNOSIS — K635 Polyp of colon: Secondary | ICD-10-CM | POA: Diagnosis not present

## 2017-02-24 DIAGNOSIS — Z86010 Personal history of colonic polyps: Secondary | ICD-10-CM

## 2017-02-24 DIAGNOSIS — D125 Benign neoplasm of sigmoid colon: Secondary | ICD-10-CM | POA: Diagnosis not present

## 2017-02-24 DIAGNOSIS — G4733 Obstructive sleep apnea (adult) (pediatric): Secondary | ICD-10-CM | POA: Diagnosis not present

## 2017-02-24 MED ORDER — SODIUM CHLORIDE 0.9 % IV SOLN: 500.0000 mL | Freq: Once | INTRAVENOUS | Status: DC

## 2017-02-24 NOTE — Patient Instructions (Addendum)
I found and removed 4 tiny polyps. One was not retrived but tiny and appears benign.  I will let you know pathology results and when to have another routine colonoscopy by mail and/or My Chart.  You also have a condition called diverticulosis - common and not usually a problem. Please read the handout provided.  I appreciate the opportunity to care for you. Gatha Mayer, MD, [facility]   *Handouts given to patient on polyps and diverticulosis*  YOU HAD AN ENDOSCOPIC PROCEDURE TODAY AT [facility]:   Refer to the procedure report that was given to you for any specific questions about what was found during the examination.  If the procedure report does not answer your questions, please call your gastroenterologist to clarify.  If you requested that your care partner not be given the details of your procedure findings, then the procedure report has been included in a sealed envelope for you to review at your convenience later.  YOU SHOULD EXPECT: Some feelings of bloating in the abdomen. Passage of more gas than usual.  Walking can help get rid of the air that was put into your GI tract during the procedure and reduce the bloating. If you had a lower endoscopy (such as a colonoscopy or flexible sigmoidoscopy) you may notice spotting of blood in your stool or on the toilet paper. If you underwent a bowel prep for your procedure, you may not have a normal bowel movement for a few days.  Please Note:  You might notice some irritation and congestion in your nose or some drainage.  This is from the oxygen used during your procedure.  There is no need for concern and it should clear up in a day or so.  SYMPTOMS TO REPORT IMMEDIATELY:   Following lower endoscopy (colonoscopy or flexible sigmoidoscopy):  Excessive amounts of blood in the stool  Significant tenderness or worsening of abdominal pains  Swelling of the abdomen that is new, acute  Fever of 100F or higher   For  urgent or emergent issues, a gastroenterologist can be reached at any hour by calling [phone].   DIET:  We do recommend a small meal at first, but then you may proceed to your regular diet.  Drink plenty of fluids but you should avoid alcoholic beverages for 24 hours.  ACTIVITY:  You should plan to take it easy for the rest of today and you should NOT DRIVE or use heavy machinery until tomorrow (because of the sedation medicines used during the test).    FOLLOW UP: Our staff will call the number listed on your records the next business day following your procedure to check on you and address any questions or concerns that you may have regarding the information given to you following your procedure. If we do not reach you, we will leave a message.  However, if you are feeling well and you are not experiencing any problems, there is no need to return our call.  We will assume that you have returned to your regular daily activities without incident.  If any biopsies were taken you will be contacted by phone or by letter within the next 1-3 weeks.  Please call us at [phone] if you have not heard about the biopsies in 3 weeks.    SIGNATURES/CONFIDENTIALITY: You and/or your care partner have signed paperwork which will be entered into your electronic medical record.  These signatures attest to the fact that that the information above on  your After Visit Summary has been reviewed and is understood.  Full responsibility of the confidentiality of this discharge information lies with you and/or your care-partner.

## 2017-02-24 NOTE — Op Note (Signed)
[facility] Patient Name: Jeffery Cline Procedure Date: 02/24/2017 8:58 AM MRN: [id number] Endoscopist: Gatha Mayer , MD Age: 72 Referring MD:  Date of Birth: Jul 13, 1945 Gender: Male Account #: [id number] Procedure:                Colonoscopy Indications:              Surveillance: Personal history of adenomatous                            polyps on last colonoscopy > 5 years ago Medicines:                Propofol per Anesthesia, Monitored Anesthesia Care Procedure:                Pre-Anesthesia Assessment:                           - Prior to the procedure, a History and Physical                            was performed, and patient medications and                            allergies were reviewed. The patient's tolerance of                            previous anesthesia was also reviewed. The risks                            and benefits of the procedure and the sedation                            options and risks were discussed with the patient.                            All questions were answered, and informed consent                            was obtained. Prior Anticoagulants: The patient has                            taken no previous anticoagulant or antiplatelet                            agents. ASA Grade Assessment: III - A patient with                            severe systemic disease. After reviewing the risks                            and benefits, the patient was deemed in                            satisfactory condition to undergo the procedure.  After obtaining informed consent, the colonoscope                            was passed under direct vision. Throughout the                            procedure, the patient's blood pressure, pulse, and                            oxygen saturations were monitored continuously. The                            Model CF-HQ190L [id number]) scope was introduced       through the anus and advanced to the the cecum,                            identified by appendiceal orifice and ileocecal                            valve. The colonoscopy was somewhat difficult due                            to restricted mobility of the colon. Successful                            completion of the procedure was aided by                            withdrawing and reinserting the scope. The patient                            tolerated the procedure well. The quality of the                            bowel preparation was excellent. The bowel                            preparation used was Miralax. The ileocecal valve,                            appendiceal orifice, and rectum were photographed. Scope In: 9:13:21 AM Scope Out: 9:34:22 AM Scope Withdrawal Time: 0 hours 15 minutes 48 seconds  Total Procedure Duration: 0 hours 21 minutes 1 second  Findings:                 The perianal and digital rectal examinations were                            normal. Pertinent negatives include normal prostate                            (size, shape, and consistency).  Three sessile polyps were found in the sigmoid                            colon and ascending colon. The polyps were                            diminutive in size. These polyps were removed with                            a cold snare. Resection was complete, but the polyp                            tissue was only partially retrieved. Verification                            of patient identification for the specimen was                            done. Estimated blood loss was minimal.                           A 2 mm polyp was found in the ileocecal valve. The                            polyp was flat. The polyp was removed with a cold                            biopsy forceps. Resection and retrieval were                            complete. Verification of patient identification                             for the specimen was done. Estimated blood loss was                            minimal.                           Multiple diverticula were found in the sigmoid                            colon. There was narrowing of the colon in                            association with the diverticular opening.                           A single small localized angiodysplastic lesion                            without bleeding was found in the cecum.  The exam was otherwise without abnormality on                            direct and retroflexion views. Complications:            No immediate complications. Estimated Blood Loss:     Estimated blood loss was minimal. Impression:               - Three diminutive polyps in the sigmoid colon and                            in the ascending colon, removed with a cold snare.                            Complete resection. Partial retrieval.                           - One 2 mm polyp at the ileocecal valve, removed                            with a cold biopsy forceps. Resected and retrieved.                           - Severe diverticulosis in the sigmoid colon. There                            was narrowing of the colon in association with the                            diverticular opening.                           - A single non-bleeding colonic angiodysplastic                            lesion. tiny 1-2 mm in cecum                           - The examination was otherwise normal on direct                            and retroflexion views.                           - Personal history of colonic polyps. 2 diminutive                            adenomas 2013 Recommendation:           - Patient has a contact number available for                            emergencies. The signs and symptoms of potential                            delayed complications  were discussed with the                            patient. Return  to normal activities tomorrow.                            Written discharge instructions were provided to the                            patient.                           - Resume previous diet.                           - Continue present medications.                           - Repeat colonoscopy is recommended for                            surveillance. The colonoscopy date will be                            determined after pathology results from today's                            exam become available for review. Gatha Mayer, MD 02/24/2017 9:44:13 AM This report has been signed electronically.

## 2017-02-24 NOTE — Progress Notes (Signed)
Report given to PACU, vss 

## 2017-02-24 NOTE — Progress Notes (Signed)
Called to room to assist during endoscopic procedure.  Patient ID and intended procedure confirmed with present staff. Received instructions for my participation in the procedure from the performing physician.  

## 2017-02-24 NOTE — Progress Notes (Signed)
Pt's states no medical or surgical changes since previsit or office visit. 

## 2017-02-24 NOTE — Progress Notes (Signed)
Patient consents to observer being present for procedure.   

## 2017-02-25 ENCOUNTER — Telehealth: Payer: Self-pay

## 2017-02-25 NOTE — Telephone Encounter (Signed)
  Follow up Call-  Call back number 02/24/2017  Post procedure Call Back phone  # [phone]  Permission to leave phone message Yes  Some recent data might be hidden     Patient questions:  Do you have a fever, pain , or abdominal swelling? No. Pain Score  0 *  Have you tolerated food without any problems? Yes.    Have you been able to return to your normal activities? Yes.    Do you have any questions about your discharge instructions: Diet   No. Medications  No. Follow up visit  No.  Do you have questions or concerns about your Care? No.  Actions: * If pain score is 4 or above: No action needed, pain <4.

## 2017-03-02 ENCOUNTER — Encounter: Payer: Self-pay | Admitting: Internal Medicine

## 2017-03-02 NOTE — Progress Notes (Signed)
2 diminutive adenomas and 1 hyperplastic Recall 2024 My Chart letter

## 2017-03-08 ENCOUNTER — Other Ambulatory Visit: Payer: Self-pay | Admitting: Internal Medicine

## 2017-03-10 ENCOUNTER — Ambulatory Visit: Payer: Medicare HMO | Admitting: Pulmonary Disease

## 2017-03-11 ENCOUNTER — Encounter: Payer: Self-pay | Admitting: Adult Health

## 2017-03-11 ENCOUNTER — Ambulatory Visit (INDEPENDENT_AMBULATORY_CARE_PROVIDER_SITE_OTHER): Payer: Medicare HMO | Admitting: Adult Health

## 2017-03-11 ENCOUNTER — Ambulatory Visit: Payer: Medicare HMO | Admitting: Pulmonary Disease

## 2017-03-11 DIAGNOSIS — G4733 Obstructive sleep apnea (adult) (pediatric): Secondary | ICD-10-CM

## 2017-03-11 NOTE — Assessment & Plan Note (Signed)
Moderate sleep apnea well-controlled on CPAP  Plan  Patient Instructions  Continue on CPAP At bedtime  .  Work on healthy weight .  Do not drive if sleepy .  Follow up with Dr. Elsworth Soho  iin 1 year and As needed

## 2017-03-11 NOTE — Progress Notes (Signed)
@Patient  ID: Jeffery Cline, male    DOB: 1945-10-28, 72 y.o.   MRN: [id number]  Chief Complaint  Patient presents with  . Follow-up    OSA    Referring provider: Colon Branch, MD  HPI: 72 year old, overweight hypertensive for FU of OSA. Home sleep study 07/2014 showed moderate OSA with AHI 27/hour and lowest desaturation of 79%. Total sleep time was 8 hours.  03/11/2017 Follow up : OSA  Patient returns for a one year follow-up for sleep apnea.  Patient says he is doing very well on CPAP at bedtime.  He feels rested with no significant daytime sleepiness.  Patient says he does not go any nights without his machine.  It makes him feel so much better. Download shows excellent compliance with average usage at 9 hours.  Patient is on CPAP 8 cm H2O.  AHI is 1.8.  Minimal leaks.      No Known Allergies  Immunization History  Administered Date(s) Administered  . Influenza Split 11/02/2013  . Influenza Whole 11/23/2007, 10/09/2009, 11/09/2012  . Influenza, High Dose Seasonal PF 11/18/2016  . Influenza,inj,Quad PF,6+ Mos 12/06/2014  . Influenza-Unspecified 11/15/2015  . Pneumococcal Conjugate-13 04/02/2014  . Pneumococcal Polysaccharide-23 01/20/2011  . Td 06/07/1998, 09/18/2008  . Zoster 02/21/2008    Past Medical History:  Diagnosis Date  . Allergic rhinitis   . Allergy   . Basal cell carcinoma 2016   Right medial posterior shoulder, shave and ED&C  . Cataract    forming   . Decreased carotid pulse    decreased L carotid pulse (?) : Nl carotidu/s 02-2006    . ED (erectile dysfunction)   . GERD (gastroesophageal reflux disease)   . Glaucoma   . Hyperlipidemia   . Hypertension   . Sleep apnea    on CPAP    Tobacco History: Social History   Tobacco Use  Smoking Status Never Smoker  Smokeless Tobacco Never Used   Counseling given: Not Answered   Outpatient Encounter Medications as of 03/11/2017  Medication Sig  . amLODipine (NORVASC) 5 MG tablet Take 1  tablet (5 mg total) by mouth daily.  Marland Kitchen aspirin 81 MG tablet Take 81 mg by mouth daily.    . COMBIGAN 0.2-0.5 % ophthalmic solution INSTILL 1 DROP IN BOTH EYES TWICE A DAY  . fenofibrate (TRICOR) 145 MG tablet Take 1 tablet (145 mg total) by mouth daily.  Marland Kitchen losartan (COZAAR) 100 MG tablet Take 1 tablet (100 mg total) by mouth daily.  . metoprolol tartrate (LOPRESSOR) 50 MG tablet Take 1 tablet (50 mg total) by mouth 2 (two) times daily.  Marland Kitchen omeprazole (PRILOSEC OTC) 20 MG tablet Take 20 mg by mouth daily.  . [DISCONTINUED] bisacodyl (DULCOLAX) 5 MG EC tablet Take 5 mg by mouth once. X 4 for colon 1-16   No facility-administered encounter medications on file as of 03/11/2017.      Review of Systems  Constitutional:   No  weight loss, night sweats,  Fevers, chills, fatigue, or  lassitude.  HEENT:   No headaches,  Difficulty swallowing,  Tooth/dental problems, or  Sore throat,                No sneezing, itching, ear ache, nasal congestion, post nasal drip,   CV:  No chest pain,  Orthopnea, PND, swelling in lower extremities, anasarca, dizziness, palpitations, syncope.   GI  No heartburn, indigestion, abdominal pain, nausea, vomiting, diarrhea, change in bowel habits, loss of appetite, bloody stools.  Resp: No shortness of breath with exertion or at rest.  No excess mucus, no productive cough,  No non-productive cough,  No coughing up of blood.  No change in color of mucus.  No wheezing.  No chest wall deformity  Skin: no rash or lesions.  GU: no dysuria, change in color of urine, no urgency or frequency.  No flank pain, no hematuria   MS:  No joint pain or swelling.  No decreased range of motion.  No back pain.    Physical Exam  BP 130/72 (BP Location: Left Arm, Cuff Size: Normal)   Pulse 67   Ht 5\' 10"  (1.778 m)   Wt 247 lb 3.2 oz (112.1 kg)   SpO2 97%   BMI 35.47 kg/m   GEN: A/Ox3; pleasant , NAD, obese   HEENT:  [place]/AT,  EACs-clear, TMs-wnl, NOSE-clear, THROAT-clear, no  lesions, no postnasal drip or exudate noted.  Class II MP . airway  NECK:  Supple w/ fair ROM; no JVD; normal carotid impulses w/o bruits; no thyromegaly or nodules palpated; no lymphadenopathy.    RESP  Clear  P & A; w/o, wheezes/ rales/ or rhonchi. no accessory muscle use, no dullness to percussion  CARD:  RRR, no m/r/g, no peripheral edema, pulses intact, no cyanosis or clubbing.  GI:   Soft & nt; nml bowel sounds; no organomegaly or masses detected.   Musco: Warm bil, no deformities or joint swelling noted.   Neuro: alert, no focal deficits noted.    Skin: Warm, no lesions or rashes    Lab Results:  CBC   BNP No results found for: BNP  ProBNP No results found for: PROBNP  Imaging: No results found.   Assessment & Plan:   OSA (obstructive sleep apnea) Moderate sleep apnea well-controlled on CPAP  Plan  Patient Instructions  Continue on CPAP At bedtime  .  Work on healthy weight .  Do not drive if sleepy .  Follow up with Dr. Elsworth Soho  iin 1 year and As needed       Morbid obesity ([place]) Weight loss     Rexene Edison, NP 03/11/2017

## 2017-03-11 NOTE — Patient Instructions (Signed)
Continue on CPAP At bedtime  .  Work on healthy weight .  Do not drive if sleepy .  Follow up with Dr. Elsworth Soho  iin 1 year and As needed

## 2017-03-11 NOTE — Assessment & Plan Note (Signed)
-   Weight loss 

## 2017-05-26 ENCOUNTER — Encounter: Payer: Self-pay | Admitting: Internal Medicine

## 2017-05-26 ENCOUNTER — Ambulatory Visit (INDEPENDENT_AMBULATORY_CARE_PROVIDER_SITE_OTHER): Payer: Medicare HMO | Admitting: Internal Medicine

## 2017-05-26 VITALS — BP 143/79 | HR 52 | Temp 97.7°F | Resp 16 | Ht 69.0 in | Wt 240.0 lb

## 2017-05-26 DIAGNOSIS — E119 Type 2 diabetes mellitus without complications: Secondary | ICD-10-CM

## 2017-05-26 DIAGNOSIS — Z0000 Encounter for general adult medical examination without abnormal findings: Secondary | ICD-10-CM

## 2017-05-26 LAB — BASIC METABOLIC PANEL
BUN: 15 mg/dL (ref 6–23)
CHLORIDE: 104 meq/L (ref 96–112)
CO2: 27 mEq/L (ref 19–32)
CREATININE: 0.98 mg/dL (ref 0.40–1.50)
Calcium: 8.9 mg/dL (ref 8.4–10.5)
GFR: 79.91 mL/min (ref >60.00)
Glucose, Bld: 117 mg/dL — ABNORMAL HIGH (ref 70–99)
POTASSIUM: 3.9 meq/L (ref 3.5–5.1)
Sodium: 139 mEq/L (ref 135–145)

## 2017-05-26 LAB — AST: AST: 20 U/L (ref 0–37)

## 2017-05-26 LAB — CBC
HEMATOCRIT: 42.9 % (ref 39.0–52.0)
HEMOGLOBIN: 14.7 g/dL (ref 13.0–17.0)
MCHC: 34.4 g/dL (ref 30.0–36.0)
MCV: 90.5 fl (ref 78.0–100.0)
Platelets: 200 10*3/uL (ref 150.0–400.0)
RBC: 4.75 Mil/uL (ref 4.22–5.81)
RDW: 13.7 % (ref 11.5–15.5)
WBC: 8.9 10*3/uL (ref 4.0–10.5)

## 2017-05-26 LAB — HEMOGLOBIN A1C: HEMOGLOBIN A1C: 6.4 % (ref 4.6–6.5)

## 2017-05-26 NOTE — Assessment & Plan Note (Addendum)
--  Td 09-2008; pnm 23 2012 ;Prevnar 2016; Zostavax 02-2008; shingrex not available  --[facility] Colonoscopy:   Date:  01/09/2002, normal Colonoscopy 04/2011, 2 polyps,  Cscope 02-2017, 5 years  --Prostate cancer screening: 05/2016 DRE slightly enlarged prostate,PSA wnl. --diet-exercise discussed   --Labs: BMP, AST, ALT, FLP, CBC, A1c

## 2017-05-26 NOTE — Progress Notes (Signed)
Subjective:    Patient ID: Jeffery Cline, male    DOB: 1945-05-06, 72 y.o.   MRN: [id number]  DOS:  05/26/2017 Type of visit - description : cpx  No concerns  Wt Readings from Last 3 Encounters:  05/26/17 240 lb (108.9 kg)  03/11/17 247 lb 3.2 oz (112.1 kg)  02/24/17 244 lb (110.7 kg)     Review of Systems In the last few months has improved his diet, exercising more, losing weight.  He feels very well. BPH: Nocturia has significantly decreased GERD: Taking less PPIs  Other than above, a 14 point review of systems is negative    Past Medical History:  Diagnosis Date  . Allergic rhinitis   . Allergy   . Basal cell carcinoma 2016   Right medial posterior shoulder, shave and ED&C  . Cataract    forming   . Decreased carotid pulse    decreased L carotid pulse (?) : Nl carotidu/s 02-2006    . ED (erectile dysfunction)   . GERD (gastroesophageal reflux disease)   . Glaucoma   . Hyperlipidemia   . Hypertension   . Sleep apnea    on CPAP    Past Surgical History:  Procedure Laterality Date  . COLONOSCOPY    . CYSTECTOMY     back, recurrent, last 01-2016  . INGUINAL HERNIA REPAIR     right by dr. Ninfa Linden  . PILONIDAL CYST EXCISION    . POLYPECTOMY    . TONSILLECTOMY      Social History   Socioeconomic History  . Marital status: Married    Spouse name: Not on file  . Number of children: 2  . Years of education: Not on file  . Highest education level: Not on file  Occupational History  . Occupation: retired  Scientific laboratory technician  . Financial resource strain: Not on file  . Food insecurity:    Worry: Not on file    Inability: Not on file  . Transportation needs:    Medical: Not on file    Non-medical: Not on file  Tobacco Use  . Smoking status: Never Smoker  . Smokeless tobacco: Never Used  Substance and Sexual Activity  . Alcohol use: Yes    Comment: rare- occ   . Drug use: No  . Sexual activity: Never  Lifestyle  . Physical activity:    Days per  week: Not on file    Minutes per session: Not on file  . Stress: Not on file  Relationships  . Social connections:    Talks on phone: Not on file    Gets together: Not on file    Attends religious service: Not on file    Active member of club or organization: Not on file    Attends meetings of clubs or organizations: Not on file    Relationship status: Not on file  . Intimate partner violence:    Fear of current or ex partner: Not on file    Emotionally abused: Not on file    Physically abused: Not on file    Forced sexual activity: Not on file  Other Topics Concern  . Not on file  Social History Narrative   Married. 2 sons ([place] and [place]) , 5 grandkids       Family History  Problem Relation Age of Onset  . Dementia Mother   . Alcohol abuse Mother   . Coronary artery disease Father 65  . Coronary artery disease Maternal Grandfather   .  Leukemia Paternal Grandfather   . Colon cancer Neg Hx   . Prostate cancer Neg Hx   . Colon polyps Neg Hx   . Rectal cancer Neg Hx   . Stomach cancer Neg Hx      Allergies as of 05/26/2017   No Known Allergies     Medication List        Accurate as of 05/26/17 11:59 PM. Always use your most recent med list.          amLODipine 5 MG tablet Commonly known as:  NORVASC Take 1 tablet (5 mg total) by mouth daily.   aspirin 81 MG tablet Take 81 mg by mouth daily.   COMBIGAN 0.2-0.5 % ophthalmic solution Generic drug:  brimonidine-timolol INSTILL 1 DROP IN BOTH EYES TWICE A DAY   fenofibrate 145 MG tablet Commonly known as:  TRICOR Take 1 tablet (145 mg total) by mouth daily.   losartan 100 MG tablet Commonly known as:  COZAAR Take 1 tablet (100 mg total) by mouth daily.   metoprolol tartrate 50 MG tablet Commonly known as:  LOPRESSOR Take 1 tablet (50 mg total) by mouth 2 (two) times daily.   omeprazole 20 MG tablet Commonly known as:  PRILOSEC OTC Take 20 mg by mouth daily.          Objective:   Physical Exam BP  (!) 143/79 (BP Location: Right Arm, Patient Position: Sitting, Cuff Size: Large)   Pulse (!) 52   Temp 97.7 F (36.5 C) (Oral)   Resp 16   Ht 5\' 9"  (1.753 m)   Wt 240 lb (108.9 kg)   SpO2 97%   BMI 35.44 kg/m  General:   Well developed, overweight appearing. NAD.  Neck: No  thyromegaly  HEENT:  Normocephalic . Face symmetric, atraumatic Lungs:  CTA B Normal respiratory effort, no intercostal retractions, no accessory muscle use. Heart: RRR,  no murmur.  No pretibial edema bilaterally  Abdomen:  Not distended, soft, non-tender. No rebound or rigidity.   DIABETIC FEET EXAM: No lower extremity edema Normal pedal pulses bilaterally Skin normal, nails normal, no calluses Pinprick examination of the feet normal. Skin: Exposed areas without rash. Not pale. Not jaundice Neurologic:  alert & oriented X3.  Speech normal, gait appropriate for age and unassisted Strength symmetric and appropriate for age.  Psych: Cognition and judgment appear intact.  Cooperative with normal attention span and concentration.  Behavior appropriate. No anxious or depressed appearing.     Assessment & Plan:    Assessment DM  A1c 6.5 (05-2016)  Morbid obesity HTN Hyperlipidemia Low testosterone: Saw endocrinology 06/2014 , mild hypogonadotropic hypogonadism. no rx recommended  Decreased libido , ED: not interested in Rx Low iron 2016: rechecked >> normal  [facility] -- on CPAP , rx Dr Elsworth Soho GERD Decreased L carotid pulse? Normal carotid ultrasound 2008 [place] 2016- sees derm q year Dr Ronnald Ramp as off 05-2017 Glaucoma   BPH  PLAN: DM: Diet controlled, doing great in the last few months, feet exam negative today, had an eye exam recently, check a A1c Morbid obesity: Doing great with diet and exercise HTN: BP today 143/79, at home in the 130s/70s.  No change, continue losartan. Hyperlipidemia, on TriCor, checking labs. GERD: Symptoms decreased, on PPIs QOD, okay to decrease to 3 times a week. BPH: Nearly  asymptomatic RTC 6-8 months

## 2017-05-26 NOTE — Patient Instructions (Signed)
GO TO THE LAB : Get the blood work     GO TO THE FRONT DESK Schedule your next appointment for a checkup in 6-8 months, no need to fast  Consider Medicare wellness visit with one of our  nurses  Check the  blood pressure 2 or 3 times a month  Be sure your blood pressure is between 110/65 and  135/85. If it is consistently higher or lower, let me know

## 2017-05-27 DIAGNOSIS — L723 Sebaceous cyst: Secondary | ICD-10-CM | POA: Diagnosis not present

## 2017-05-27 NOTE — Assessment & Plan Note (Signed)
DM: Diet controlled, doing great in the last few months, feet exam negative today, had an eye exam recently, check a A1c Morbid obesity: Doing great with diet and exercise HTN: BP today 143/79, at home in the 130s/70s.  No change, continue losartan. Hyperlipidemia, on TriCor, checking labs. GERD: Symptoms decreased, on PPIs QOD, okay to decrease to 3 times a week. BPH: Nearly asymptomatic RTC 6-8 months

## 2017-06-24 ENCOUNTER — Other Ambulatory Visit: Payer: Self-pay | Admitting: Internal Medicine

## 2017-07-09 DIAGNOSIS — G4733 Obstructive sleep apnea (adult) (pediatric): Secondary | ICD-10-CM | POA: Diagnosis not present

## 2017-08-18 ENCOUNTER — Ambulatory Visit (INDEPENDENT_AMBULATORY_CARE_PROVIDER_SITE_OTHER): Payer: Medicare HMO | Admitting: Internal Medicine

## 2017-08-18 ENCOUNTER — Encounter: Payer: Self-pay | Admitting: Internal Medicine

## 2017-08-18 VITALS — BP 126/80 | HR 58 | Temp 97.8°F | Resp 16 | Ht 69.0 in | Wt 240.0 lb

## 2017-08-18 DIAGNOSIS — R05 Cough: Secondary | ICD-10-CM

## 2017-08-18 DIAGNOSIS — K219 Gastro-esophageal reflux disease without esophagitis: Secondary | ICD-10-CM | POA: Diagnosis not present

## 2017-08-18 DIAGNOSIS — R059 Cough, unspecified: Secondary | ICD-10-CM

## 2017-08-18 MED ORDER — PANTOPRAZOLE SODIUM 40 MG PO TBEC: 40.0000 mg | DELAYED_RELEASE_TABLET | Freq: Every day | ORAL | 6 refills | Status: DC

## 2017-08-18 NOTE — Progress Notes (Signed)
Pre visit review using our clinic review tool, if applicable. No additional management support is needed unless otherwise documented below in the visit note. 

## 2017-08-18 NOTE — Progress Notes (Signed)
Subjective:    Patient ID: Jeffery Cline, male    DOB: [birth year], 72 y.o.   MRN: [id number]  DOS:  08/18/2017 Type of visit - description : acute Interval history: His main concern is cough, he thinks is related to GERD.  Long history of GERD usually manifested as  cough.  Symptoms controlled with omeprazole, lately he decrease omeprazole to every other day and he was doing great. Few weeks ago eat excessive "hot sauce" and shortly after starting coughing again. He does not have classic heartburn but he noted increased burping and is constantly "clearing the throat". He also noticed some nasal congestion, more than usual. He is now taking omeprazole daily and ranitidine twice a day but the cough continue.   Review of Systems No fever chills Some sinus congestion. No sputum production No chest pain or palpitation No nausea, vomiting, diarrhea.  Past Medical History:  Diagnosis Date  . Allergic rhinitis   . Allergy   . Basal cell carcinoma 2016   Right medial posterior shoulder, shave and ED&C  . Cataract    forming   . Decreased carotid pulse    decreased L carotid pulse (?) : Nl carotidu/s 02-2006    . ED (erectile dysfunction)   . GERD (gastroesophageal reflux disease)   . Glaucoma   . Hyperlipidemia   . Hypertension   . Sleep apnea    on CPAP    Past Surgical History:  Procedure Laterality Date  . COLONOSCOPY    . CYSTECTOMY     back, recurrent, last 01-2016  . INGUINAL HERNIA REPAIR     right by dr. Ninfa Linden  . PILONIDAL CYST EXCISION    . POLYPECTOMY    . TONSILLECTOMY      Social History   Socioeconomic History  . Marital status: Married    Spouse name: Not on file  . Number of children: 2  . Years of education: Not on file  . Highest education level: Not on file  Occupational History  . Occupation: retired  Scientific laboratory technician  . Financial resource strain: Not on file  . Food insecurity:    Worry: Not on file    Inability: Not on file  .  Transportation needs:    Medical: Not on file    Non-medical: Not on file  Tobacco Use  . Smoking status: Never Smoker  . Smokeless tobacco: Never Used  Substance and Sexual Activity  . Alcohol use: Yes    Comment: rare- occ   . Drug use: No  . Sexual activity: Never  Lifestyle  . Physical activity:    Days per week: Not on file    Minutes per session: Not on file  . Stress: Not on file  Relationships  . Social connections:    Talks on phone: Not on file    Gets together: Not on file    Attends religious service: Not on file    Active member of club or organization: Not on file    Attends meetings of clubs or organizations: Not on file    Relationship status: Not on file  . Intimate partner violence:    Fear of current or ex partner: Not on file    Emotionally abused: Not on file    Physically abused: Not on file    Forced sexual activity: Not on file  Other Topics Concern  . Not on file  Social History Narrative   Married. 2 sons ([place] and [place]) , 5 grandkids  Allergies as of 08/18/2017   No Known Allergies     Medication List        Accurate as of 08/18/17 11:23 AM. Always use your most recent med list.          amLODipine 5 MG tablet Commonly known as:  NORVASC Take 1 tablet (5 mg total) by mouth daily.   aspirin 81 MG tablet Take 81 mg by mouth daily.   COMBIGAN 0.2-0.5 % ophthalmic solution Generic drug:  brimonidine-timolol INSTILL 1 DROP IN BOTH EYES TWICE A DAY   fenofibrate 145 MG tablet Commonly known as:  TRICOR Take 1 tablet (145 mg total) by mouth daily.   losartan 100 MG tablet Commonly known as:  COZAAR Take 1 tablet (100 mg total) by mouth daily.   metoprolol tartrate 50 MG tablet Commonly known as:  LOPRESSOR Take 1 tablet (50 mg total) by mouth 2 (two) times daily.   omeprazole 20 MG tablet Commonly known as:  PRILOSEC OTC Take 20 mg by mouth daily.   ranitidine 300 MG tablet Commonly known as:  ZANTAC Take 150 mg by  mouth 2 (two) times daily.          Objective:   Physical Exam BP 126/80 (BP Location: Left Arm, Patient Position: Sitting, Cuff Size: Normal)   Pulse (!) 58   Temp 97.8 F (36.6 C) (Oral)   Resp 16   Ht 5\' 9"  (1.753 m)   Wt 240 lb (108.9 kg)   SpO2 98%   BMI 35.44 kg/m  General:   Well developed, NAD, see BMI.  Frequent throat clearing and mild cough noted HEENT:  Normocephalic . Face symmetric, atraumatic.  Throat symmetric, TMs normal, nose congested, sinuses no TTP Lungs:  CTA B Normal respiratory effort, no intercostal retractions, no accessory muscle use. Heart: RRR,  no murmur.  No pretibial edema bilaterally  Skin: Not pale. Not jaundice Neurologic:  alert & oriented X3.  Speech normal, gait appropriate for age and unassisted Psych--  Cognition and judgment appear intact.  Cooperative with normal attention span and concentration.  Behavior appropriate. No anxious or depressed appearing.      Assessment & Plan:      Assessment DM  A1c 6.5 (05-2016)  Morbid obesity HTN Hyperlipidemia Low testosterone: Saw endocrinology 06/2014 , mild hypogonadotropic hypogonadism. no rx recommended  Decreased libido , ED: not interested in Rx Low iron 2016: rechecked >> normal  [facility] -- on CPAP , rx Dr Elsworth Soho GERD Decreased L carotid pulse? Normal carotid ultrasound 2008 [facility] 2016- sees derm q year Dr Ronnald Ramp as off 05-2017 Glaucoma   BPH  PLAN: Cough: Suspect is a combination of GERD and postnasal dripping.  If not improving with treatment he will let me know.  See AVS GERD: Was well controlled with full dose of omeprazole, symptoms have resurface in the context of taking less omeprazole and dietary indiscretions. Plan: switch to  Pantoprazole 40 mg daily, okay to continue ranitidine for few more days, after few weeks, okay to cut down pantoprazole to every other day if patient so desires. Rhinitis: Flonase consistently.  Claritin for few days.  RTC scheduled for November

## 2017-08-18 NOTE — Patient Instructions (Addendum)
Stop omeprazole  Start pantoprazole 40 mg 1 before breakfast  Ok Ranitidine for few days  Use flonase daily for 3 weeks  Ok to use saline spray  Take claritin 10 mg 1 a day for few days  Call if no better in 2 weeks  Call any time if symptoms worsen

## 2017-08-18 NOTE — Assessment & Plan Note (Signed)
Cough: Suspect is a combination of GERD and postnasal dripping.  If not improving with treatment he will let me know.  See AVS GERD: Was well controlled with full dose of omeprazole, symptoms have resurface in the context of taking less omeprazole and dietary indiscretions. Plan: switch to  Pantoprazole 40 mg daily, okay to continue ranitidine for few more days, after few weeks, okay to cut down pantoprazole to every other day if patient so desires. Rhinitis: Flonase consistently.  Claritin for few days.  RTC scheduled for November

## 2017-08-24 DIAGNOSIS — H353121 Nonexudative age-related macular degeneration, left eye, early dry stage: Secondary | ICD-10-CM | POA: Diagnosis not present

## 2017-08-24 DIAGNOSIS — H401131 Primary open-angle glaucoma, bilateral, mild stage: Secondary | ICD-10-CM | POA: Diagnosis not present

## 2017-08-24 DIAGNOSIS — H2513 Age-related nuclear cataract, bilateral: Secondary | ICD-10-CM | POA: Diagnosis not present

## 2017-08-24 LAB — HM DIABETES EYE EXAM

## 2017-08-27 ENCOUNTER — Other Ambulatory Visit: Payer: Self-pay | Admitting: Internal Medicine

## 2017-09-07 DIAGNOSIS — G4733 Obstructive sleep apnea (adult) (pediatric): Secondary | ICD-10-CM | POA: Diagnosis not present

## 2017-09-09 IMAGING — CR DG CHEST 2V
2 series · 2 of 2 positions shown · non-contrast
Comparison: 08/21/2010

CLINICAL DATA: Chills and weakness

EXAM:
CHEST - 2 VIEW

[w chest pa]
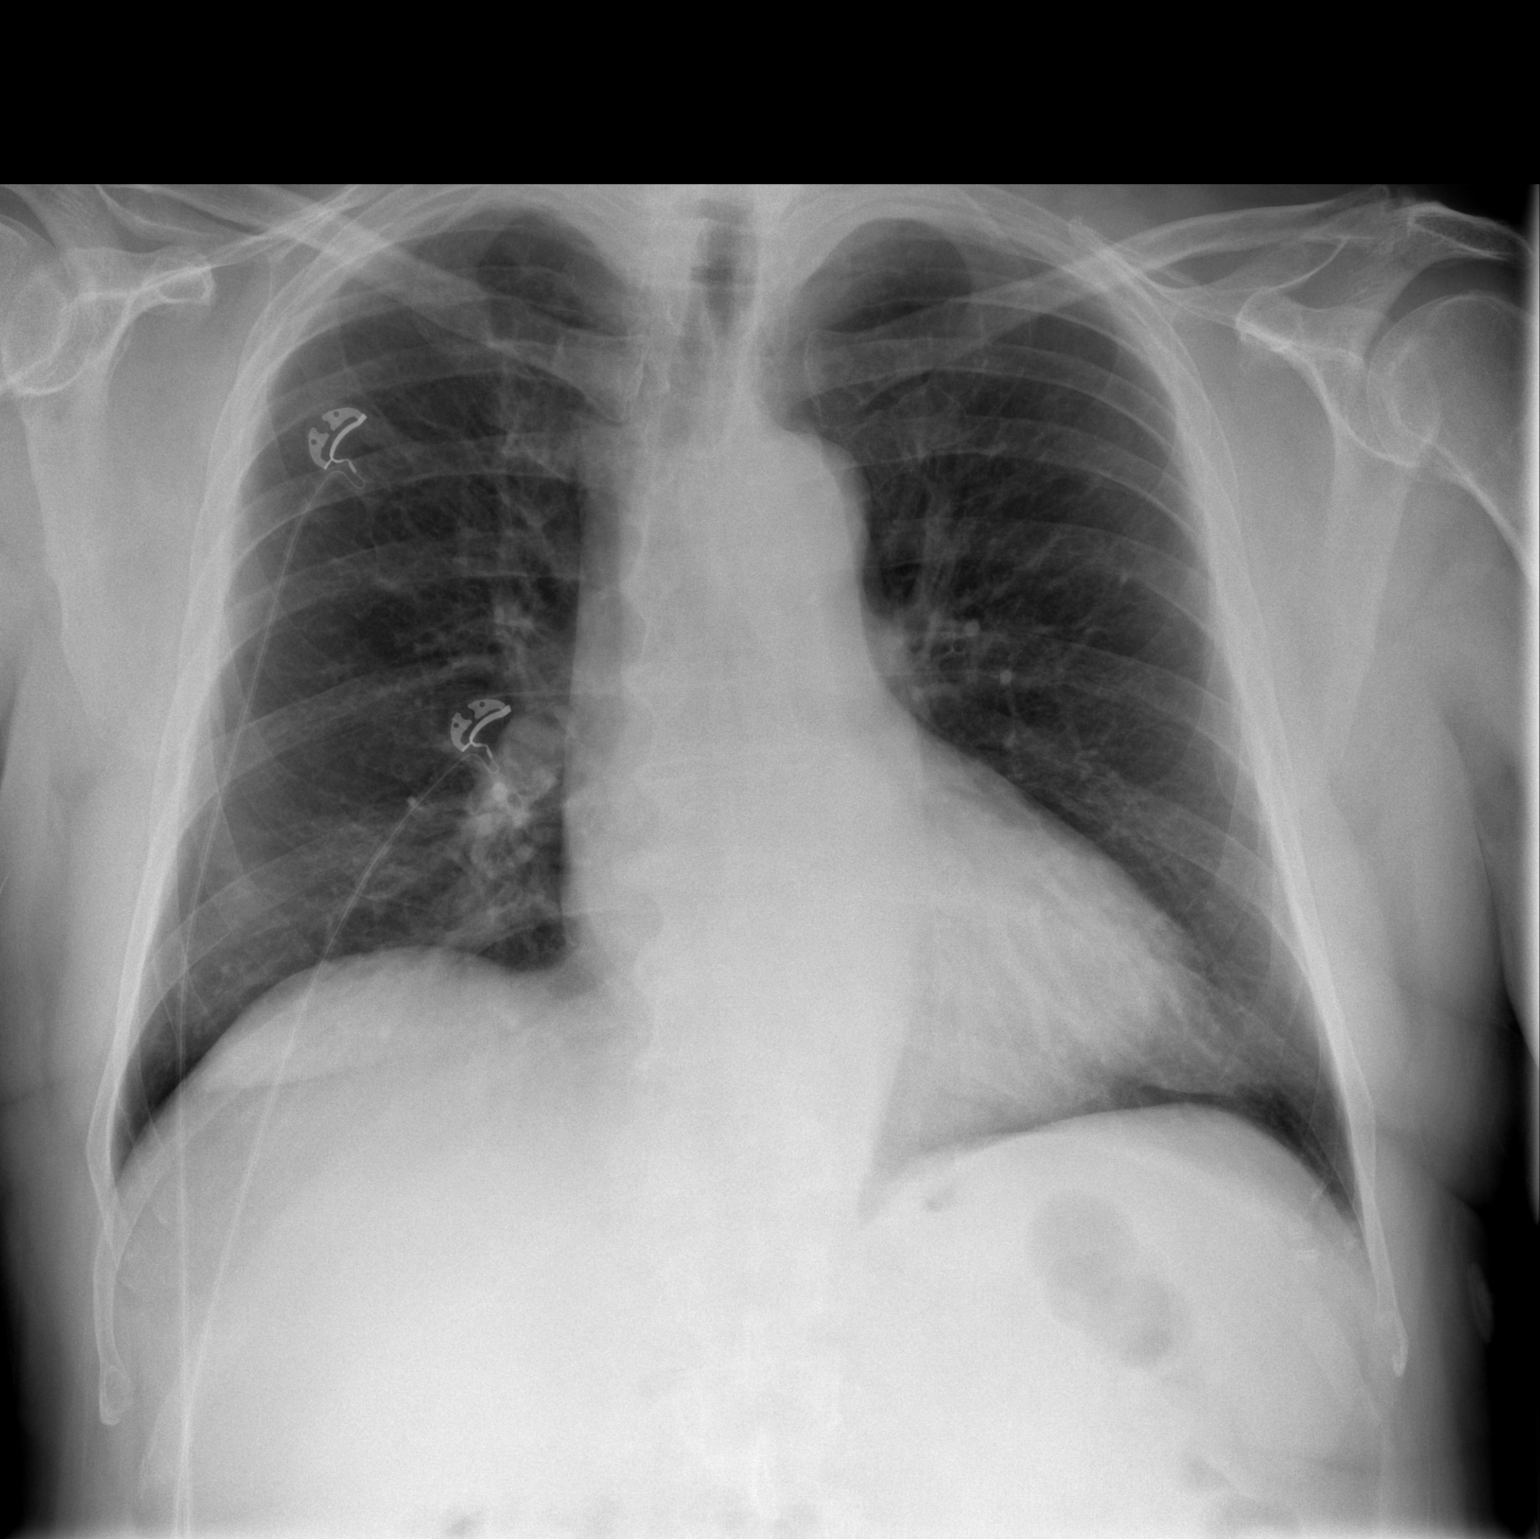

[w chest lat]
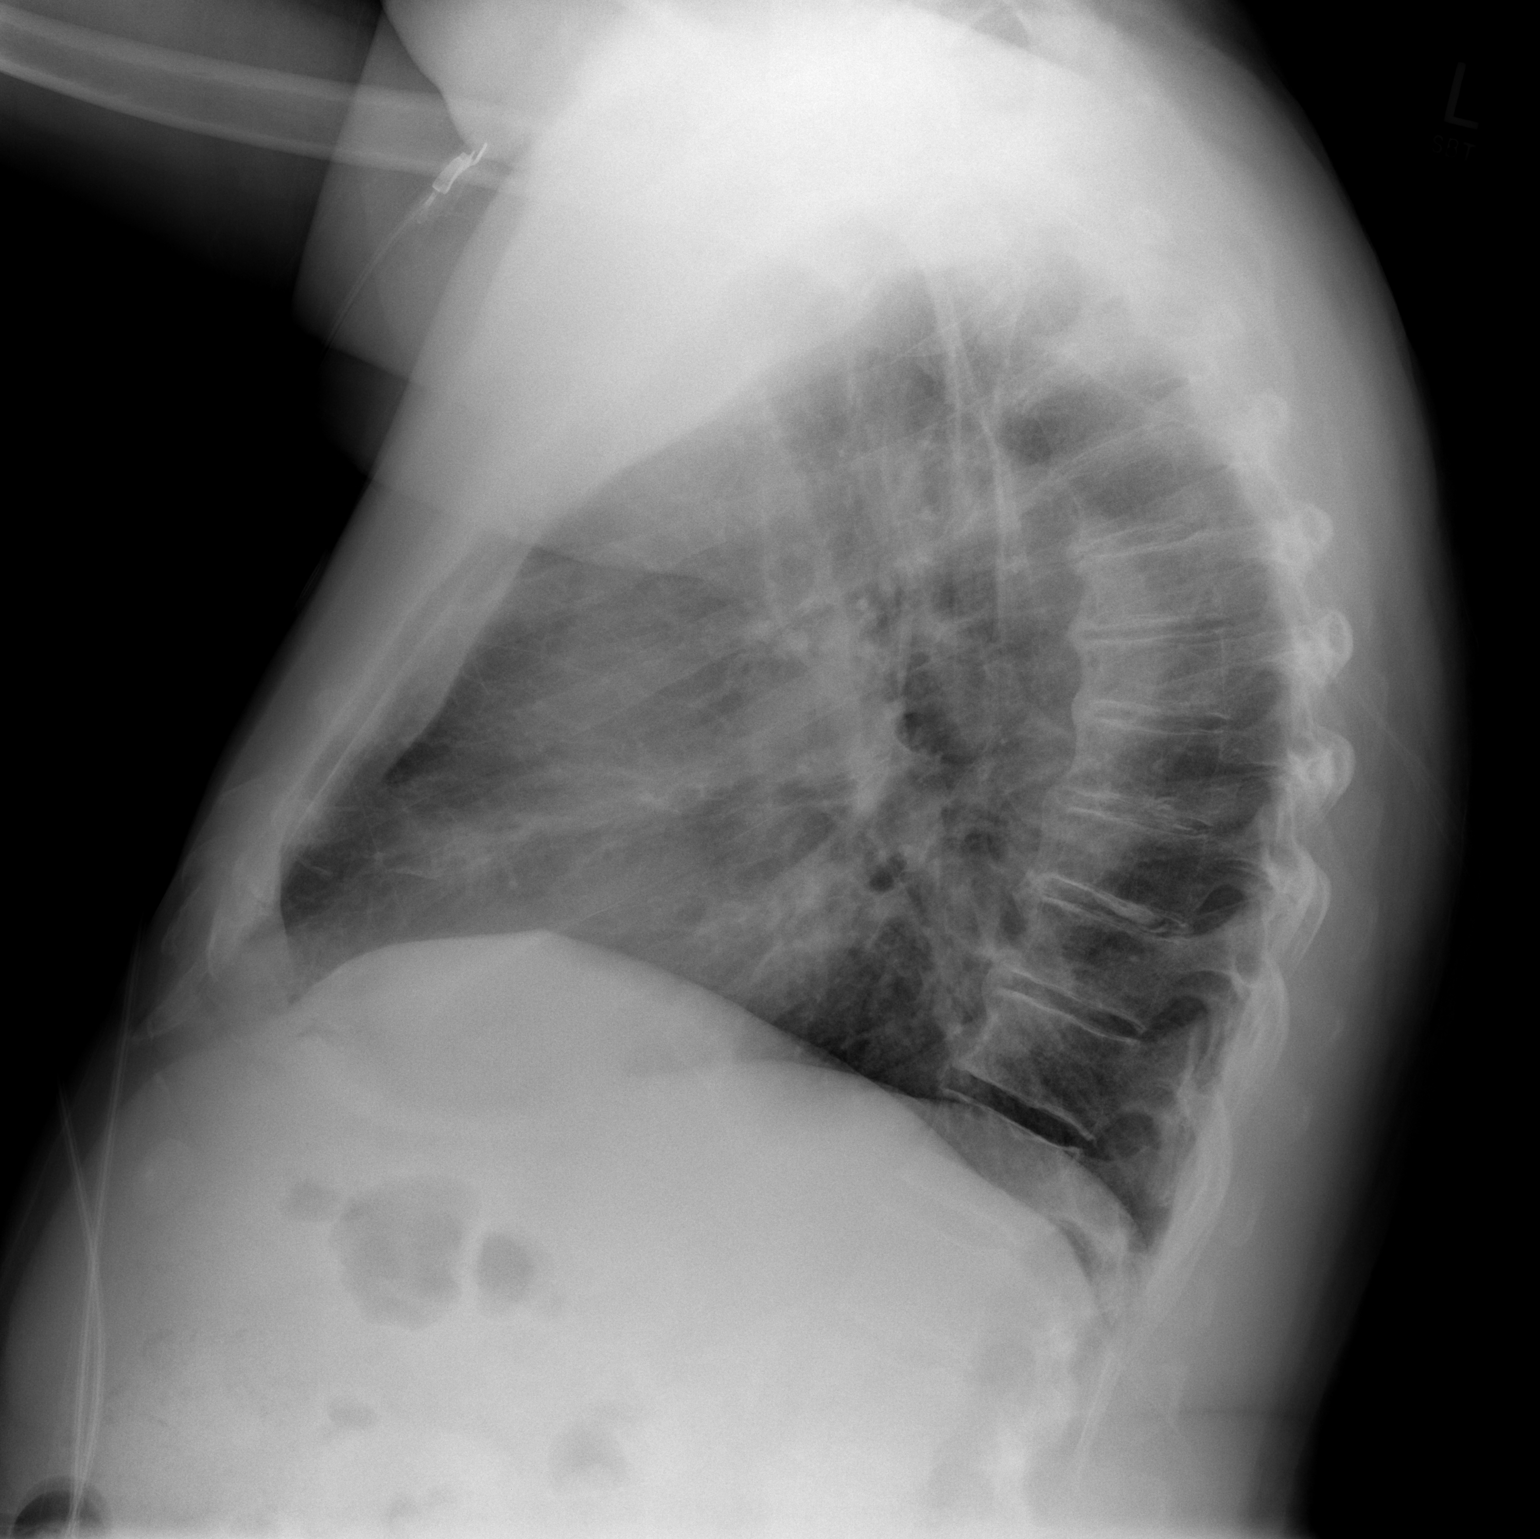

[2 of 2 positions shown; findings below may reference images not displayed]

FINDINGS: Cardiac shadow is within normal limits. Lungs are well aerated
bilaterally. No focal infiltrate or sizable effusion is seen.
Degenerative changes of the thoracic spine are noted.
IMPRESSION: No acute abnormality seen.

## 2017-11-26 DIAGNOSIS — R69 Illness, unspecified: Secondary | ICD-10-CM | POA: Diagnosis not present

## 2017-12-06 DIAGNOSIS — Z85828 Personal history of other malignant neoplasm of skin: Secondary | ICD-10-CM | POA: Diagnosis not present

## 2017-12-06 DIAGNOSIS — D1801 Hemangioma of skin and subcutaneous tissue: Secondary | ICD-10-CM | POA: Diagnosis not present

## 2017-12-06 DIAGNOSIS — L738 Other specified follicular disorders: Secondary | ICD-10-CM | POA: Diagnosis not present

## 2017-12-06 DIAGNOSIS — D485 Neoplasm of uncertain behavior of skin: Secondary | ICD-10-CM | POA: Diagnosis not present

## 2017-12-06 DIAGNOSIS — L723 Sebaceous cyst: Secondary | ICD-10-CM | POA: Diagnosis not present

## 2017-12-06 DIAGNOSIS — L821 Other seborrheic keratosis: Secondary | ICD-10-CM | POA: Diagnosis not present

## 2017-12-10 ENCOUNTER — Encounter: Payer: Self-pay | Admitting: Internal Medicine

## 2017-12-24 ENCOUNTER — Other Ambulatory Visit: Payer: Self-pay | Admitting: Internal Medicine

## 2017-12-27 ENCOUNTER — Ambulatory Visit (INDEPENDENT_AMBULATORY_CARE_PROVIDER_SITE_OTHER): Payer: Medicare HMO | Admitting: Internal Medicine

## 2017-12-27 ENCOUNTER — Encounter: Payer: Self-pay | Admitting: Internal Medicine

## 2017-12-27 VITALS — BP 126/78 | HR 54 | Temp 98.1°F | Resp 16 | Ht 69.0 in | Wt 242.0 lb

## 2017-12-27 DIAGNOSIS — E785 Hyperlipidemia, unspecified: Secondary | ICD-10-CM

## 2017-12-27 DIAGNOSIS — I10 Essential (primary) hypertension: Secondary | ICD-10-CM | POA: Diagnosis not present

## 2017-12-27 DIAGNOSIS — Z125 Encounter for screening for malignant neoplasm of prostate: Secondary | ICD-10-CM

## 2017-12-27 DIAGNOSIS — E119 Type 2 diabetes mellitus without complications: Secondary | ICD-10-CM

## 2017-12-27 LAB — COMPREHENSIVE METABOLIC PANEL
ALK PHOS: 43 U/L (ref 39–117)
ALT: 36 U/L (ref 0–53)
AST: 23 U/L (ref 0–37)
Albumin: 4.4 g/dL (ref 3.5–5.2)
BILIRUBIN TOTAL: 0.7 mg/dL (ref 0.2–1.2)
BUN: 16 mg/dL (ref 6–23)
CALCIUM: 9.7 mg/dL (ref 8.4–10.5)
CO2: 30 mEq/L (ref 19–32)
Chloride: 103 mEq/L (ref 96–112)
Creatinine, Ser: 1.13 mg/dL (ref 0.40–1.50)
GFR: 67.69 mL/min (ref >60.00)
GLUCOSE: 132 mg/dL — AB (ref 70–99)
Potassium: 4.3 mEq/L (ref 3.5–5.1)
Sodium: 141 mEq/L (ref 135–145)
Total Protein: 6.7 g/dL (ref 6.0–8.3)

## 2017-12-27 LAB — LIPID PANEL
Cholesterol: 161 mg/dL (ref 0–200)
HDL: 27.8 mg/dL — AB (ref >39.00)
LDL Cholesterol: 98 mg/dL (ref 0–99)
NONHDL: 133.64
Total CHOL/HDL Ratio: 6
Triglycerides: 177 mg/dL — ABNORMAL HIGH (ref 0.0–149.0)
VLDL: 35.4 mg/dL (ref 0.0–40.0)

## 2017-12-27 LAB — PSA: PSA: 1.51 ng/mL (ref 0.10–4.00)

## 2017-12-27 LAB — HEMOGLOBIN A1C: HEMOGLOBIN A1C: 6.8 % — AB (ref 4.6–6.5)

## 2017-12-27 NOTE — Progress Notes (Signed)
Pre visit review using our clinic review tool, if applicable. No additional management support is needed unless otherwise documented below in the visit note. 

## 2017-12-27 NOTE — Progress Notes (Signed)
Subjective:    Patient ID: Jeffery Cline, male    DOB: [birth year], 72 y.o.   MRN: [id number]  DOS:  12/27/2017 Type of visit - description : rov Interval history:  Routine visit, states he feels great. He is very active Sleeping very well Good compliance with CPAP Has changed his habits/diet has lost some weight on his own scales. No ambulatory CBGs. Concerned about prostate cancer, check a PSA?  Wt Readings from Last 3 Encounters:  12/27/17 242 lb (109.8 kg)  08/18/17 240 lb (108.9 kg)  05/26/17 240 lb (108.9 kg)    Review of Systems   Past Medical History:  Diagnosis Date  . Allergic rhinitis   . Allergy   . Basal cell carcinoma 2016   Right medial posterior shoulder, shave and ED&C  . Cataract    forming   . Decreased carotid pulse    decreased L carotid pulse (?) : Nl carotidu/s 02-2006    . ED (erectile dysfunction)   . GERD (gastroesophageal reflux disease)   . Glaucoma   . Hyperlipidemia   . Hypertension   . Sleep apnea    on CPAP    Past Surgical History:  Procedure Laterality Date  . COLONOSCOPY    . CYSTECTOMY     back, recurrent, last 01-2016  . INGUINAL HERNIA REPAIR     right by dr. Ninfa Linden  . PILONIDAL CYST EXCISION    . POLYPECTOMY    . TONSILLECTOMY      Social History   Socioeconomic History  . Marital status: Married    Spouse name: Not on file  . Number of children: 2  . Years of education: Not on file  . Highest education level: Not on file  Occupational History  . Occupation: retired  Scientific laboratory technician  . Financial resource strain: Not on file  . Food insecurity:    Worry: Not on file    Inability: Not on file  . Transportation needs:    Medical: Not on file    Non-medical: Not on file  Tobacco Use  . Smoking status: Never Smoker  . Smokeless tobacco: Never Used  Substance and Sexual Activity  . Alcohol use: Yes    Comment: rare- occ   . Drug use: No  . Sexual activity: Not Currently  Lifestyle  . Physical  activity:    Days per week: Not on file    Minutes per session: Not on file  . Stress: Not on file  Relationships  . Social connections:    Talks on phone: Not on file    Gets together: Not on file    Attends religious service: Not on file    Active member of club or organization: Not on file    Attends meetings of clubs or organizations: Not on file    Relationship status: Not on file  . Intimate partner violence:    Fear of current or ex partner: Not on file    Emotionally abused: Not on file    Physically abused: Not on file    Forced sexual activity: Not on file  Other Topics Concern  . Not on file  Social History Narrative   Married. 2 sons ([place] and [place]) , 5 grandkids        Allergies as of 12/27/2017   No Known Allergies     Medication List        Accurate as of 12/27/17  7:47 PM. Always use your most recent med list.  amLODipine 5 MG tablet Commonly known as:  NORVASC Take 1 tablet (5 mg total) by mouth daily.   aspirin 81 MG tablet Take 81 mg by mouth daily.   COMBIGAN 0.2-0.5 % ophthalmic solution Generic drug:  brimonidine-timolol INSTILL 1 DROP IN BOTH EYES TWICE A DAY   fenofibrate 145 MG tablet Commonly known as:  TRICOR Take 1 tablet (145 mg total) by mouth daily.   losartan 100 MG tablet Commonly known as:  COZAAR Take 1 tablet (100 mg total) by mouth daily.   metoprolol tartrate 50 MG tablet Commonly known as:  LOPRESSOR Take 1 tablet (50 mg total) by mouth 2 (two) times daily.   pantoprazole 40 MG tablet Commonly known as:  PROTONIX Take 1 tablet (40 mg total) by mouth daily before breakfast.          Objective:   Physical Exam BP 126/78 (BP Location: Left Arm, Patient Position: Sitting, Cuff Size: Normal)   Pulse (!) 54   Temp 98.1 F (36.7 C) (Oral)   Resp 16   Ht 5\' 9"  (1.753 m)   Wt 242 lb (109.8 kg)   SpO2 97%   BMI 35.74 kg/m  General:   Well developed, NAD, BMI noted. HEENT:  Normocephalic . Face  symmetric, atraumatic Lungs:  CTA B Normal respiratory effort, no intercostal retractions, no accessory muscle use. Heart: RRR,  no murmur.  No pretibial edema bilaterally  Skin: Not pale. Not jaundice Neurologic:  alert & oriented X3.  Speech normal, gait appropriate for age and unassisted Psych--  Cognition and judgment appear intact.  Cooperative with normal attention span and concentration.  Behavior appropriate. No anxious or depressed appearing.      Assessment & Plan:   Assessment DM  A1c 6.5 (05-2016)  Morbid obesity HTN Hyperlipidemia Low testosterone: Saw endocrinology 06/2014 , mild hypogonadotropic hypogonadism. no rx recommended  Decreased libido , ED: not interested in Rx Low iron 2016: rechecked >> normal  [facility] -- on CPAP , rx Dr Elsworth Soho GERD Decreased L carotid pulse? Normal carotid ultrasound 2008 [facility] 2016- sees derm q year Dr Ronnald Ramp as off 05-2017 Glaucoma    BPH  PLAN: DM: Diet controlled, check a CMP and A1c.  Has changed his lifestyle and lost some weight on his own scales Hyperlipidemia: On fenofibrate, check FLP Concerned about prostate cancer, check a PSA.  DRE at the time of next CPX. Morbid obesity: doing better w/  Diet  RTC 05/2018 CPX

## 2017-12-27 NOTE — Patient Instructions (Addendum)
Please schedule Medicare Wellness with Glenard Haring.   GO TO THE LAB : Get the blood work     GO TO THE FRONT DESK Schedule your next appointment for a  Physical exam by 05-2018

## 2017-12-27 NOTE — Assessment & Plan Note (Addendum)
DM: Diet controlled, check a CMP and A1c.  Has changed his lifestyle and lost some weight on his own scales Hyperlipidemia: On fenofibrate, check FLP Concerned about prostate cancer, check a PSA.  DRE at the time of next CPX. Morbid obesity: doing better w/  Diet  RTC 05/2018 CPX

## 2018-01-11 DIAGNOSIS — G4733 Obstructive sleep apnea (adult) (pediatric): Secondary | ICD-10-CM | POA: Diagnosis not present

## 2018-01-25 DIAGNOSIS — H401131 Primary open-angle glaucoma, bilateral, mild stage: Secondary | ICD-10-CM | POA: Diagnosis not present

## 2018-01-25 DIAGNOSIS — H353131 Nonexudative age-related macular degeneration, bilateral, early dry stage: Secondary | ICD-10-CM | POA: Diagnosis not present

## 2018-01-25 DIAGNOSIS — H25813 Combined forms of age-related cataract, bilateral: Secondary | ICD-10-CM | POA: Diagnosis not present

## 2018-01-31 ENCOUNTER — Other Ambulatory Visit: Payer: Self-pay | Admitting: Internal Medicine

## 2018-02-01 ENCOUNTER — Other Ambulatory Visit: Payer: Self-pay | Admitting: Internal Medicine

## 2018-02-18 ENCOUNTER — Other Ambulatory Visit: Payer: Self-pay | Admitting: Internal Medicine

## 2018-02-21 ENCOUNTER — Other Ambulatory Visit: Payer: Self-pay | Admitting: Internal Medicine

## 2018-02-21 MED ORDER — FENOFIBRATE 145 MG PO TABS: 145.0000 mg | ORAL_TABLET | Freq: Every day | ORAL | 1 refills | Status: DC

## 2018-02-21 MED ORDER — AMLODIPINE BESYLATE 5 MG PO TABS: 5.0000 mg | ORAL_TABLET | Freq: Every day | ORAL | 1 refills | Status: DC

## 2018-02-21 NOTE — Addendum Note (Signed)
Addended byDamita Dunnings D on: 02/21/2018 11:27 AM   Modules accepted: Orders

## 2018-02-24 ENCOUNTER — Other Ambulatory Visit: Payer: Self-pay | Admitting: Internal Medicine

## 2018-03-11 ENCOUNTER — Ambulatory Visit (INDEPENDENT_AMBULATORY_CARE_PROVIDER_SITE_OTHER): Payer: Medicare HMO | Admitting: Pulmonary Disease

## 2018-03-11 ENCOUNTER — Encounter: Payer: Self-pay | Admitting: Pulmonary Disease

## 2018-03-11 DIAGNOSIS — G4733 Obstructive sleep apnea (adult) (pediatric): Secondary | ICD-10-CM

## 2018-03-11 DIAGNOSIS — I10 Essential (primary) hypertension: Secondary | ICD-10-CM

## 2018-03-11 NOTE — Assessment & Plan Note (Signed)
Well-controlled on 3 meds

## 2018-03-11 NOTE — Patient Instructions (Signed)
CPAP is set at 8 cm. CPAP supplies will be renewed for a year

## 2018-03-11 NOTE — Progress Notes (Signed)
   Subjective:    Patient ID: Jeffery Cline, male    DOB: 10-14-1945, 73 y.o.   MRN: [id number]  HPI  81 y o overweight hypertensive for FU of OSA.  Chief Complaint  Patient presents with  . Follow-up    1 yr f/u for OSA. Denies any issues with cpap. Uses [employer] as his DME.    CPAP is working well, he has settled down with his air fit F 20 fullface mask. Denies any problems with mask or pressure.  He likes to read in bed.  We discussed alternative masks but he would prefer not to "rock the boat". He travels and take the machine with him.  Compliance was confirmed on download which shows excellent usage more than 8 hours every night, good control of events and minimal leak about 5 nights a month  Blood pressure is well controlled on 3 medications, bradycardic due to metoprolol  Significant tests/ events reviewed  HST 07/2014 showed moderate OSA with AHI 27/hour and lowest desaturation of 79%. Total sleep time was 8 hours.  Review of Systems Patient denies significant dyspnea,cough, hemoptysis,  chest pain, palpitations, pedal edema, orthopnea, paroxysmal nocturnal dyspnea, lightheadedness, nausea, vomiting, abdominal or  leg pains      Objective:   Physical Exam  Gen. Pleasant, well-nourished, in no distress ENT - no thrush, no pallor/icterus,no post nasal drip Neck: No JVD, no thyromegaly, no carotid bruits Lungs: no use of accessory muscles, no dullness to percussion, clear without rales or rhonchi  Cardiovascular: Rhythm regular, heart sounds  normal, no murmurs or gallops, no peripheral edema Musculoskeletal: No deformities, no cyanosis or clubbing         Assessment & Plan:

## 2018-03-11 NOTE — Addendum Note (Signed)
Addended by: Valerie Salts on: 03/11/2018 09:19 AM   Modules accepted: Orders

## 2018-03-11 NOTE — Assessment & Plan Note (Signed)
CPAP is set at 8 cm. CPAP supplies will be renewed for a year  He is very compliant and CPAP is certainly helping improve his daytime somnolence and fatigue  Weight loss encouraged, compliance with goal of at least 4-6 hrs every night is the expectation. Advised against medications with sedative side effects Cautioned against driving when sleepy - understanding that sleepiness will vary on a day to day basis

## 2018-03-12 ENCOUNTER — Other Ambulatory Visit: Payer: Self-pay | Admitting: Internal Medicine

## 2018-03-14 MED ORDER — PANTOPRAZOLE SODIUM 40 MG PO TBEC: 40.0000 mg | DELAYED_RELEASE_TABLET | Freq: Every day | ORAL | 3 refills | Status: DC

## 2018-03-14 NOTE — Addendum Note (Signed)
Addended byDamita Dunnings D on: 03/14/2018 09:53 AM   Modules accepted: Orders

## 2018-03-29 DIAGNOSIS — H401131 Primary open-angle glaucoma, bilateral, mild stage: Secondary | ICD-10-CM | POA: Diagnosis not present

## 2018-03-29 DIAGNOSIS — H353131 Nonexudative age-related macular degeneration, bilateral, early dry stage: Secondary | ICD-10-CM | POA: Diagnosis not present

## 2018-03-29 DIAGNOSIS — H25813 Combined forms of age-related cataract, bilateral: Secondary | ICD-10-CM | POA: Diagnosis not present

## 2018-04-15 DIAGNOSIS — H35372 Puckering of macula, left eye: Secondary | ICD-10-CM | POA: Diagnosis not present

## 2018-04-15 DIAGNOSIS — D3131 Benign neoplasm of right choroid: Secondary | ICD-10-CM | POA: Diagnosis not present

## 2018-04-15 DIAGNOSIS — H43811 Vitreous degeneration, right eye: Secondary | ICD-10-CM | POA: Diagnosis not present

## 2018-04-15 DIAGNOSIS — H353131 Nonexudative age-related macular degeneration, bilateral, early dry stage: Secondary | ICD-10-CM | POA: Diagnosis not present

## 2018-05-31 ENCOUNTER — Encounter: Payer: Medicare HMO | Admitting: Internal Medicine

## 2018-05-31 ENCOUNTER — Ambulatory Visit: Payer: Medicare HMO | Admitting: *Deleted

## 2018-06-13 DIAGNOSIS — M25511 Pain in right shoulder: Secondary | ICD-10-CM | POA: Diagnosis not present

## 2018-06-13 DIAGNOSIS — M25512 Pain in left shoulder: Secondary | ICD-10-CM | POA: Diagnosis not present

## 2018-06-18 ENCOUNTER — Other Ambulatory Visit: Payer: Self-pay | Admitting: Internal Medicine

## 2018-06-20 DIAGNOSIS — M47812 Spondylosis without myelopathy or radiculopathy, cervical region: Secondary | ICD-10-CM | POA: Diagnosis not present

## 2018-06-20 DIAGNOSIS — M25511 Pain in right shoulder: Secondary | ICD-10-CM | POA: Diagnosis not present

## 2018-06-20 DIAGNOSIS — M542 Cervicalgia: Secondary | ICD-10-CM | POA: Diagnosis not present

## 2018-06-20 DIAGNOSIS — M25512 Pain in left shoulder: Secondary | ICD-10-CM | POA: Diagnosis not present

## 2018-07-13 DIAGNOSIS — G4733 Obstructive sleep apnea (adult) (pediatric): Secondary | ICD-10-CM | POA: Diagnosis not present

## 2018-07-20 ENCOUNTER — Encounter: Payer: Medicare HMO | Admitting: Internal Medicine

## 2018-07-25 ENCOUNTER — Ambulatory Visit: Payer: Medicare HMO | Admitting: Internal Medicine

## 2018-08-10 DIAGNOSIS — M25512 Pain in left shoulder: Secondary | ICD-10-CM | POA: Diagnosis not present

## 2018-08-10 DIAGNOSIS — M25511 Pain in right shoulder: Secondary | ICD-10-CM | POA: Diagnosis not present

## 2018-08-10 DIAGNOSIS — M47812 Spondylosis without myelopathy or radiculopathy, cervical region: Secondary | ICD-10-CM | POA: Diagnosis not present

## 2018-08-17 ENCOUNTER — Other Ambulatory Visit: Payer: Self-pay | Admitting: Internal Medicine

## 2018-08-17 ENCOUNTER — Encounter: Payer: Self-pay | Admitting: Internal Medicine

## 2018-08-17 ENCOUNTER — Other Ambulatory Visit: Payer: Self-pay

## 2018-08-17 ENCOUNTER — Ambulatory Visit (INDEPENDENT_AMBULATORY_CARE_PROVIDER_SITE_OTHER): Payer: Medicare HMO | Admitting: Internal Medicine

## 2018-08-17 VITALS — BP 144/91 | HR 64 | Temp 97.9°F | Resp 18 | Ht 69.0 in | Wt 234.0 lb

## 2018-08-17 DIAGNOSIS — Z0000 Encounter for general adult medical examination without abnormal findings: Secondary | ICD-10-CM

## 2018-08-17 DIAGNOSIS — M791 Myalgia, unspecified site: Secondary | ICD-10-CM

## 2018-08-17 DIAGNOSIS — M25512 Pain in left shoulder: Secondary | ICD-10-CM

## 2018-08-17 DIAGNOSIS — E119 Type 2 diabetes mellitus without complications: Secondary | ICD-10-CM

## 2018-08-17 DIAGNOSIS — M25511 Pain in right shoulder: Secondary | ICD-10-CM

## 2018-08-17 DIAGNOSIS — E785 Hyperlipidemia, unspecified: Secondary | ICD-10-CM | POA: Diagnosis not present

## 2018-08-17 LAB — COMPREHENSIVE METABOLIC PANEL
ALT: 27 U/L (ref 0–53)
AST: 16 U/L (ref 0–37)
Albumin: 4.6 g/dL (ref 3.5–5.2)
Alkaline Phosphatase: 52 U/L (ref 39–117)
BUN: 15 mg/dL (ref 6–23)
CO2: 27 mEq/L (ref 19–32)
Calcium: 9.3 mg/dL (ref 8.4–10.5)
Chloride: 101 mEq/L (ref 96–112)
Creatinine, Ser: 0.95 mg/dL (ref 0.40–1.50)
GFR: 77.67 mL/min (ref >60.00)
Glucose, Bld: 109 mg/dL — ABNORMAL HIGH (ref 70–99)
Potassium: 4.1 mEq/L (ref 3.5–5.1)
Sodium: 137 mEq/L (ref 135–145)
Total Bilirubin: 0.9 mg/dL (ref 0.2–1.2)
Total Protein: 6.7 g/dL (ref 6.0–8.3)

## 2018-08-17 LAB — CBC WITH DIFFERENTIAL/PLATELET
Basophils Absolute: 0 10*3/uL (ref 0.0–0.1)
Basophils Relative: 0.2 % (ref 0.0–3.0)
Eosinophils Absolute: 0.5 10*3/uL (ref 0.0–0.7)
Eosinophils Relative: 2.9 % (ref 0.0–5.0)
HCT: 44.1 % (ref 39.0–52.0)
Hemoglobin: 14.8 g/dL (ref 13.0–17.0)
Lymphocytes Relative: 20.1 % (ref 12.0–46.0)
Lymphs Abs: 3.6 10*3/uL (ref 0.7–4.0)
MCHC: 33.7 g/dL (ref 30.0–36.0)
MCV: 92.1 fl (ref 78.0–100.0)
Monocytes Absolute: 1.5 10*3/uL — ABNORMAL HIGH (ref 0.1–1.0)
Monocytes Relative: 8.7 % (ref 3.0–12.0)
Neutro Abs: 12.2 10*3/uL — ABNORMAL HIGH (ref 1.4–7.7)
Neutrophils Relative %: 68.1 % (ref 43.0–77.0)
Platelets: 211 10*3/uL (ref 150.0–400.0)
RBC: 4.78 Mil/uL (ref 4.22–5.81)
RDW: 14 % (ref 11.5–15.5)
WBC: 17.9 10*3/uL — ABNORMAL HIGH (ref 4.0–10.5)

## 2018-08-17 LAB — SEDIMENTATION RATE: Sed Rate: 7 mm/hr (ref 0–20)

## 2018-08-17 LAB — HEMOGLOBIN A1C: Hgb A1c MFr Bld: 7.1 % — ABNORMAL HIGH (ref 4.6–6.5)

## 2018-08-17 MED ORDER — TETANUS-DIPHTH-ACELL PERTUSSIS 5-2.5-18.5 LF-MCG/0.5 IM SUSP: 0.5000 mL | Freq: Once | INTRAMUSCULAR | 0 refills | Status: AC

## 2018-08-17 MED ORDER — SHINGRIX 50 MCG/0.5ML IM SUSR: 0.5000 mL | Freq: Once | INTRAMUSCULAR | 1 refills | Status: AC

## 2018-08-17 NOTE — Assessment & Plan Note (Addendum)
-  Td 09-2008, recommend Tdap Rx printed  - pnm 23 2012 -Prevnar 2016 - Zostavax 02-2008 -shingrex :rx printed  - flu shot strongly recommend  --CCS Colonoscopy:   Date:  01/09/2002, normal Colonoscopy 04/2011, 2 polyps,  Cscope 02-2017, 5 years  --Prostate cancer screening:  DRE today wnl, PSA was done 12/2017 wnl --diet-exercise discussed   --Labs:  cmp cbc a1c sed rate

## 2018-08-17 NOTE — Progress Notes (Signed)
Subjective:    Patient ID: Jeffery Cline, male    DOB: 1945-06-05, 73 y.o.   MRN: [id number]  DOS:  08/17/2018 Type of visit - description: cpx Other issues also addressed He developed bilateral right shoulder, he got partially better w/ a round of prednisone and then local injections. Denies headache, fever, chills, generalized myalgias.  No pain around her hips.  No visual disturbances. He left upper extremity paresthesias.   BP Readings from Last 3 Encounters:  08/17/18 (!) 144/91  03/11/18 114/70  12/27/17 126/78   Wt Readings from Last 3 Encounters:  08/17/18 234 lb (106.1 kg)  03/11/18 240 lb 9.6 oz (109.1 kg)  12/27/17 242 lb (109.8 kg)    Review of Systems  Other than above, a 14 point review of systems is negative   Past Medical History:  Diagnosis Date  . Allergic rhinitis   . Allergy   . Basal cell carcinoma 2016   Right medial posterior shoulder, shave and ED&C  . Cataract    forming   . Decreased carotid pulse    decreased L carotid pulse (?) : Nl carotidu/s 02-2006    . ED (erectile dysfunction)   . GERD (gastroesophageal reflux disease)   . Glaucoma   . Hyperlipidemia   . Hypertension   . Sleep apnea    on CPAP    Past Surgical History:  Procedure Laterality Date  . COLONOSCOPY    . CYSTECTOMY     back, recurrent, last 01-2016  . INGUINAL HERNIA REPAIR     right by dr. Ninfa Linden  . PILONIDAL CYST EXCISION    . POLYPECTOMY    . TONSILLECTOMY      Social History   Socioeconomic History  . Marital status: Married    Spouse name: Not on file  . Number of children: 2  . Years of education: Not on file  . Highest education level: Not on file  Occupational History  . Occupation: retired  Scientific laboratory technician  . Financial resource strain: Not on file  . Food insecurity    Worry: Not on file    Inability: Not on file  . Transportation needs    Medical: Not on file    Non-medical: Not on file  Tobacco Use  . Smoking status: Never Smoker  .  Smokeless tobacco: Never Used  Substance and Sexual Activity  . Alcohol use: Yes    Comment: very rare   . Drug use: No  . Sexual activity: Not Currently  Lifestyle  . Physical activity    Days per week: Not on file    Minutes per session: Not on file  . Stress: Not on file  Relationships  . Social Herbalist on phone: Not on file    Gets together: Not on file    Attends religious service: Not on file    Active member of club or organization: Not on file    Attends meetings of clubs or organizations: Not on file    Relationship status: Not on file  . Intimate partner violence    Fear of current or ex partner: Not on file    Emotionally abused: Not on file    Physically abused: Not on file    Forced sexual activity: Not on file  Other Topics Concern  . Not on file  Social History Narrative   Married. 2 sons ([place] and [place]) , 5 grandkids       Family History  Problem  Relation Age of Onset  . Dementia Mother   . Alcohol abuse Mother   . Coronary artery disease Father 22  . Coronary artery disease Maternal Grandfather   . Leukemia Paternal Grandfather   . Colon cancer Neg Hx   . Prostate cancer Neg Hx   . Colon polyps Neg Hx   . Rectal cancer Neg Hx   . Stomach cancer Neg Hx      Allergies as of 08/17/2018   No Known Allergies     Medication List       Accurate as of August 17, 2018 11:59 PM. If you have any questions, ask your nurse or doctor.        STOP taking these medications   losartan 100 MG tablet Commonly known as: COZAAR Stopped by: Kathlene November, MD     TAKE these medications   amLODipine 5 MG tablet Commonly known as: NORVASC Take 1 tablet (5 mg total) by mouth daily.   aspirin 81 MG tablet Take 81 mg by mouth daily.   Combigan 0.2-0.5 % ophthalmic solution Generic drug: brimonidine-timolol INSTILL 1 DROP IN BOTH EYES TWICE A DAY   fenofibrate 145 MG tablet Commonly known as: TRICOR Take 1 tablet (145 mg total) by mouth daily.    metoprolol tartrate 50 MG tablet Commonly known as: LOPRESSOR Take 1 tablet (50 mg total) by mouth 2 (two) times daily.   pantoprazole 40 MG tablet Commonly known as: PROTONIX Take 1 tablet (40 mg total) by mouth daily before breakfast.   Shingrix injection Generic drug: Zoster Vaccine Adjuvanted Inject 0.5 mLs into the muscle once for 1 dose. Started by: Kathlene November, MD   Tdap 5-2.5-18.5 LF-MCG/0.5 injection Commonly known as: BOOSTRIX Inject 0.5 mLs into the muscle once for 1 dose. Started by: Kathlene November, MD   telmisartan 80 MG tablet Commonly known as: MICARDIS Take 1 tablet (80 mg total) by mouth daily. Started by: Kathlene November, MD           Objective:   Physical Exam BP (!) 144/91 (BP Location: Left Arm, Patient Position: Sitting, Cuff Size: Small)   Pulse 64   Temp 97.9 F (36.6 C) (Oral)   Resp 18   Ht 5\' 9"  (1.753 m)   Wt 234 lb (106.1 kg)   SpO2 98%   BMI 34.56 kg/m  General: Well developed, NAD, BMI noted Neck: No  thyromegaly  HEENT:  Normocephalic . Face symmetric, atraumatic Lungs:  CTA B Normal respiratory effort, no intercostal retractions, no accessory muscle use. Heart: RRR,  no murmur.  No pretibial edema bilaterally  Abdomen:  Not distended, soft, non-tender. No rebound or rigidity.   Skin: Exposed areas without rash. Not pale. Not jaundice DRE: Normal sphincter tone, brown stools, prostate gland not tender or nodular.  Minimally increased size. Neurologic:  alert & oriented X3.  Speech normal, gait appropriate for age and unassisted Strength symmetric and appropriate for age.  Psych: Cognition and judgment appear intact.  Cooperative with normal attention span and concentration.  Behavior appropriate. No anxious or depressed appearing.     Assessment      Assessment DM  A1c 6.5 (05-2016)  Morbid obesity HTN Hyperlipidemia Low testosterone: Saw endocrinology 06/2014 , mild hypogonadotropic hypogonadism. no rx recommended  Decreased  libido , ED: not interested in Rx Low iron 2016: rechecked >> normal  [facility] -- on CPAP , rx Dr Elsworth Soho GERD ( dx based on response to cough to PPIs) Decreased L carotid pulse? Normal carotid  ultrasound 2008 [facility] 2016- sees derm q year Dr Ronnald Ramp as off 08/2018 Glaucoma    BPH  PLAN: DM: Diet controlled: Check A1c HTN: On amlodipine, losartan, metoprolol.  BP today slightly elevated but typically normal when he goes to other doctors or checks at home.  No change, check ambulatory BPs. Hyperlipidemia: Last FLP satisfactory.  Continue TriCor. GERD: Initial symptom was cough, never had classic heartburn, today he denies a specifically dysphasia or odynophagia.  Continue with PPIs. Shoulder pain bilaterally: Symptoms certainly could be due to a local problem however I like to check a sed rate,PMR is  in the differential.  Advised patient to keep an eye on symptoms, see AVS. RTC 4 months   Today, in addition to the CPX, I spent more than 15 min with the patient: >50% of the time counseling regards other medical problems including DM, HTN, hyperlipidemia, GERD, shoulder pain.

## 2018-08-17 NOTE — Patient Instructions (Signed)
GO TO THE LAB : Get the blood work     GO TO THE FRONT DESK Schedule your next appointment for a check up in 4 months   Get a Tdap and Shingrix at your pharmacy  If you have generalized myalgias, fever, chills, weight loss, increased aches and pains let me know   Check the  blood pressure weekly Be sure your blood pressure is between 110/65 and  135/85. If it is consistently higher or lower, let me know

## 2018-08-17 NOTE — Telephone Encounter (Signed)
Losartan 100mg  on back order. Requesting to switch to telmisartan 80mg .

## 2018-08-17 NOTE — Telephone Encounter (Signed)
Rx sent- mychart message sent to Pt to inform of med change.

## 2018-08-17 NOTE — Telephone Encounter (Signed)
Ok to do

## 2018-08-17 NOTE — Progress Notes (Signed)
Pre visit review using our clinic review tool, if applicable. No additional management support is needed unless otherwise documented below in the visit note. 

## 2018-08-18 NOTE — Assessment & Plan Note (Signed)
DM: Diet controlled: Check A1c HTN: On amlodipine, losartan, metoprolol.  BP today slightly elevated but typically normal when he goes to other doctors or checks at home.  No change, check ambulatory BPs. Hyperlipidemia: Last FLP satisfactory.  Continue TriCor. GERD: Initial symptom was cough, never had classic heartburn, today he denies a specifically dysphasia or odynophagia.  Continue with PPIs. Shoulder pain bilaterally: Symptoms certainly could be due to a local problem however I like to check a sed rate,PMR is  in the differential.  Advised patient to keep an eye on symptoms, see AVS. RTC 4 months

## 2018-08-19 ENCOUNTER — Ambulatory Visit: Payer: Medicare HMO | Admitting: *Deleted

## 2018-09-26 DIAGNOSIS — R69 Illness, unspecified: Secondary | ICD-10-CM | POA: Diagnosis not present

## 2018-10-13 DIAGNOSIS — H401132 Primary open-angle glaucoma, bilateral, moderate stage: Secondary | ICD-10-CM | POA: Diagnosis not present

## 2018-10-13 DIAGNOSIS — H353131 Nonexudative age-related macular degeneration, bilateral, early dry stage: Secondary | ICD-10-CM | POA: Diagnosis not present

## 2018-10-13 DIAGNOSIS — H25813 Combined forms of age-related cataract, bilateral: Secondary | ICD-10-CM | POA: Diagnosis not present

## 2018-11-04 DIAGNOSIS — M7552 Bursitis of left shoulder: Secondary | ICD-10-CM | POA: Diagnosis not present

## 2018-11-04 DIAGNOSIS — M7551 Bursitis of right shoulder: Secondary | ICD-10-CM | POA: Diagnosis not present

## 2018-11-04 DIAGNOSIS — M25512 Pain in left shoulder: Secondary | ICD-10-CM | POA: Diagnosis not present

## 2018-11-04 DIAGNOSIS — M25511 Pain in right shoulder: Secondary | ICD-10-CM | POA: Diagnosis not present

## 2018-11-10 HISTORY — PX: MOHS SURGERY: SUR867

## 2018-11-22 ENCOUNTER — Encounter: Payer: Self-pay | Admitting: Internal Medicine

## 2018-11-22 DIAGNOSIS — C441191 Basal cell carcinoma of skin of left upper eyelid, including canthus: Secondary | ICD-10-CM | POA: Diagnosis not present

## 2018-11-22 DIAGNOSIS — C441192 Basal cell carcinoma of skin of left lower eyelid, including canthus: Secondary | ICD-10-CM | POA: Diagnosis not present

## 2018-11-22 DIAGNOSIS — Z85828 Personal history of other malignant neoplasm of skin: Secondary | ICD-10-CM | POA: Diagnosis not present

## 2018-11-22 DIAGNOSIS — D485 Neoplasm of uncertain behavior of skin: Secondary | ICD-10-CM | POA: Diagnosis not present

## 2018-11-30 ENCOUNTER — Other Ambulatory Visit: Payer: Self-pay

## 2018-11-30 ENCOUNTER — Ambulatory Visit (HOSPITAL_BASED_OUTPATIENT_CLINIC_OR_DEPARTMENT_OTHER)
Admission: RE | Admit: 2018-11-30 | Discharge: 2018-11-30 | Disposition: A | Discharge status: Home / Self Care | Payer: Medicare HMO | Source: Ambulatory Visit | Attending: Internal Medicine | Admitting: Internal Medicine

## 2018-11-30 ENCOUNTER — Ambulatory Visit (INDEPENDENT_AMBULATORY_CARE_PROVIDER_SITE_OTHER): Payer: Medicare HMO | Admitting: Internal Medicine

## 2018-11-30 ENCOUNTER — Encounter: Payer: Self-pay | Admitting: Internal Medicine

## 2018-11-30 VITALS — BP 146/84 | HR 63 | Temp 97.4°F | Resp 18 | Ht 69.0 in | Wt 238.0 lb

## 2018-11-30 DIAGNOSIS — K219 Gastro-esophageal reflux disease without esophagitis: Secondary | ICD-10-CM

## 2018-11-30 DIAGNOSIS — Z20828 Contact with and (suspected) exposure to other viral communicable diseases: Secondary | ICD-10-CM

## 2018-11-30 DIAGNOSIS — D72829 Elevated white blood cell count, unspecified: Secondary | ICD-10-CM

## 2018-11-30 DIAGNOSIS — R05 Cough: Secondary | ICD-10-CM

## 2018-11-30 DIAGNOSIS — J189 Pneumonia, unspecified organism: Secondary | ICD-10-CM

## 2018-11-30 DIAGNOSIS — R059 Cough, unspecified: Secondary | ICD-10-CM

## 2018-11-30 DIAGNOSIS — Z20822 Contact with and (suspected) exposure to covid-19: Secondary | ICD-10-CM

## 2018-11-30 LAB — CBC WITH DIFFERENTIAL/PLATELET
Basophils Absolute: 0 10*3/uL (ref 0.0–0.1)
Basophils Relative: 0.5 % (ref 0.0–3.0)
Eosinophils Absolute: 0.6 10*3/uL (ref 0.0–0.7)
Eosinophils Relative: 6.8 % — ABNORMAL HIGH (ref 0.0–5.0)
HCT: 40.9 % (ref 39.0–52.0)
Hemoglobin: 14 g/dL (ref 13.0–17.0)
Lymphocytes Relative: 26.7 % (ref 12.0–46.0)
Lymphs Abs: 2.5 10*3/uL (ref 0.7–4.0)
MCHC: 34.2 g/dL (ref 30.0–36.0)
MCV: 91.3 fl (ref 78.0–100.0)
Monocytes Absolute: 1 10*3/uL (ref 0.1–1.0)
Monocytes Relative: 11.2 % (ref 3.0–12.0)
Neutro Abs: 5.1 10*3/uL (ref 1.4–7.7)
Neutrophils Relative %: 54.8 % (ref 43.0–77.0)
Platelets: 214 10*3/uL (ref 150.0–400.0)
RBC: 4.48 Mil/uL (ref 4.22–5.81)
RDW: 13.1 % (ref 11.5–15.5)
WBC: 9.4 10*3/uL (ref 4.0–10.5)

## 2018-11-30 MED ORDER — PANTOPRAZOLE SODIUM 40 MG PO TBEC: 40.0000 mg | DELAYED_RELEASE_TABLET | Freq: Two times a day (BID) | ORAL | 3 refills | Status: DC

## 2018-11-30 MED ORDER — AZITHROMYCIN 250 MG PO TABS: ORAL_TABLET | ORAL | 0 refills | Status: DC

## 2018-11-30 MED ORDER — CEFACLOR 500 MG PO CAPS: 500.0000 mg | ORAL_CAPSULE | Freq: Three times a day (TID) | ORAL | 0 refills | Status: DC

## 2018-11-30 NOTE — Progress Notes (Signed)
Subjective:    Patient ID: Jeffery Cline, male    DOB: 04/14/1945, 73 y.o.   MRN: [id number]  DOS:  11/30/2018 Type of visit - description: Acute 6 weeks ago developed a persisting cough, "cannot shake it", mostly dry. Admits to postnasal dripping, some sinus congestion, feels like his throat is irritated. He is taking Protonix for GERD, no classic heartburn.  Also found a lump at the left chest 3 months ago.  No pain, discharge, redness   Review of Systems No fever chills No chest pain no difficulty breathing.  Occasional wheezing. No hoarseness per se No myalgias No nausea, vomiting, diarrhea  Past Medical History:  Diagnosis Date  . Allergic rhinitis   . Allergy   . Basal cell carcinoma 2016   Right medial posterior shoulder, shave and ED&C  . Cataract    forming   . Decreased carotid pulse    decreased L carotid pulse (?) : Nl carotidu/s 02-2006    . ED (erectile dysfunction)   . GERD (gastroesophageal reflux disease)   . Glaucoma   . Hyperlipidemia   . Hypertension   . Sleep apnea    on CPAP    Past Surgical History:  Procedure Laterality Date  . COLONOSCOPY    . CYSTECTOMY     back, recurrent, last 01-2016  . INGUINAL HERNIA REPAIR     right by dr. Ninfa Linden  . PILONIDAL CYST EXCISION    . POLYPECTOMY    . TONSILLECTOMY      Social History   Socioeconomic History  . Marital status: Married    Spouse name: Not on file  . Number of children: 2  . Years of education: Not on file  . Highest education level: Not on file  Occupational History  . Occupation: retired  Scientific laboratory technician  . Financial resource strain: Not on file  . Food insecurity    Worry: Not on file    Inability: Not on file  . Transportation needs    Medical: Not on file    Non-medical: Not on file  Tobacco Use  . Smoking status: Never Smoker  . Smokeless tobacco: Never Used  Substance and Sexual Activity  . Alcohol use: Yes    Comment: very rare   . Drug use: No  . Sexual  activity: Not Currently  Lifestyle  . Physical activity    Days per week: Not on file    Minutes per session: Not on file  . Stress: Not on file  Relationships  . Social Herbalist on phone: Not on file    Gets together: Not on file    Attends religious service: Not on file    Active member of club or organization: Not on file    Attends meetings of clubs or organizations: Not on file    Relationship status: Not on file  . Intimate partner violence    Fear of current or ex partner: Not on file    Emotionally abused: Not on file    Physically abused: Not on file    Forced sexual activity: Not on file  Other Topics Concern  . Not on file  Social History Narrative   Married. 2 sons ([place] and [place]) , 5 grandkids        Allergies as of 11/30/2018   No Known Allergies     Medication List       Accurate as of November 30, 2018  8:23 AM. If you have  any questions, ask your nurse or doctor.        amLODipine 5 MG tablet Commonly known as: NORVASC Take 1 tablet (5 mg total) by mouth daily.   aspirin 81 MG tablet Take 81 mg by mouth daily.   Combigan 0.2-0.5 % ophthalmic solution Generic drug: brimonidine-timolol INSTILL 1 DROP IN BOTH EYES TWICE A DAY   fenofibrate 145 MG tablet Commonly known as: TRICOR Take 1 tablet (145 mg total) by mouth daily.   metoprolol tartrate 50 MG tablet Commonly known as: LOPRESSOR Take 1 tablet (50 mg total) by mouth 2 (two) times daily.   pantoprazole 40 MG tablet Commonly known as: PROTONIX Take 1 tablet (40 mg total) by mouth daily before breakfast.   telmisartan 80 MG tablet Commonly known as: MICARDIS Take 1 tablet (80 mg total) by mouth daily.           Objective:   Physical Exam Musculoskeletal:       Arms:    BP (!) 146/84 (BP Location: Left Arm, Patient Position: Sitting, Cuff Size: Normal)   Pulse 63   Temp (!) 97.4 F (36.3 C) (Temporal)   Resp 18   Ht 5\' 9"  (1.753 m)   Wt 238 lb (108 kg)   SpO2  97%   BMI 35.15 kg/m   General:   Well developed, NAD, BMI noted. HEENT:  Normocephalic . Face symmetric, atraumatic. Throat: Symmetric, not red. Nose: Slightly congested TMs: Obscured by wax Lungs:  Mild expiratory wheezing, cleared with cough. Normal respiratory effort, no intercostal retractions, no accessory muscle use. Heart: RRR,  no murmur.  No pretibial edema bilaterally  Skin: Not pale. Not jaundice Neurologic:  alert & oriented X3.  Speech normal, gait appropriate for age and unassisted Psych--  Cognition and judgment appear intact.  Cooperative with normal attention span and concentration.  Behavior appropriate. No anxious or depressed appearing.       Assessment      Assessment DM  A1c 6.5 (05-2016)  Morbid obesity HTN Hyperlipidemia Low testosterone: Saw endocrinology 06/2014 , mild hypogonadotropic hypogonadism. no rx recommended  Decreased libido , ED: not interested in Rx Low iron 2016: rechecked >> normal  [facility] -- on CPAP , rx Dr Elsworth Soho GERD ( dx based on response to cough to PPIs) Decreased L carotid pulse? Normal carotid ultrasound 2008 [facility] 2016- sees derm q year Dr Ronnald Ramp as off 08/2018 Glaucoma    BPH  PLAN: Cough: For 6 weeks, no fever chills, doubt he has  an active viral infection but could be atypical bronchitis. Also, historically GERD manifest itself as cough on this patient. Plan: Chest x-ray Z-Pak, Flonase, Mucinex DM. Temporarily increase Protonix to twice a day before meals Call if not better, may need further evaluation. Sebaceous cyst: Patient has a history of multiple sebaceous cysts, chest lump seems to be one of them, Rx observation for now, but plans to see his dermatologist next week. Recent elevated WBC: Recheck today Addendum: Advise patient, Chest x-ray showed pneumonia, continue Zithromax, add cefaclor.  Check for Covid.  Follow-up with me in person in 1 month.  Will need a follow-up chest x-ray

## 2018-11-30 NOTE — Patient Instructions (Addendum)
Per our records you are due for an eye exam. Please contact your eye doctor to schedule an appointment. Please have them send copies of your office visit notes to [place]. Our fax number is [phone].     STOP BY THE FIRST FLOOR:  get the XR   Start taking an antibiotic called Zithromax  Temporarily increase pantoprazole to 1 tablet twice a day.  We sent a new prescription  We sent an antibiotic, Z-Pak.  Take it for 5 days  Get over-the-counter Flonase, 2 sprays on each side of the nose daily until better  For cough:  Take Mucinex DM twice a day as needed until better   Call if not gradually better

## 2018-11-30 NOTE — Progress Notes (Signed)
Pre visit review using our clinic review tool, if applicable. No additional management support is needed unless otherwise documented below in the visit note. 

## 2018-12-01 NOTE — Assessment & Plan Note (Signed)
Cough: For 6 weeks, no fever chills, doubt he has  an active viral infection but could be atypical bronchitis. Also, historically GERD manifest itself as cough on this patient. Plan: Chest x-ray Z-Pak, Flonase, Mucinex DM. Temporarily increase Protonix to twice a day before meals Call if not better, may need further evaluation. Sebaceous cyst: Patient has a history of multiple sebaceous cysts, chest lump seems to be one of them, Rx observation for now, but plans to see his dermatologist next week. Recent elevated WBC: Recheck today Addendum: Advise patient, Chest x-ray showed pneumonia, continue Zithromax, add cefaclor.  Check for Covid.  Follow-up with me in person in 1 month.  Will need a follow-up chest x-ray

## 2018-12-02 LAB — NOVEL CORONAVIRUS, NAA: SARS-CoV-2, NAA: NOT DETECTED

## 2018-12-03 ENCOUNTER — Encounter (HOSPITAL_BASED_OUTPATIENT_CLINIC_OR_DEPARTMENT_OTHER): Payer: Self-pay | Admitting: Adult Health

## 2018-12-03 ENCOUNTER — Other Ambulatory Visit: Payer: Self-pay

## 2018-12-03 ENCOUNTER — Emergency Department (HOSPITAL_BASED_OUTPATIENT_CLINIC_OR_DEPARTMENT_OTHER)
Admission: EM | Admit: 2018-12-03 | Discharge: 2018-12-03 | Disposition: A | Discharge status: Home / Self Care | Payer: Medicare HMO | Attending: Emergency Medicine | Admitting: Emergency Medicine

## 2018-12-03 DIAGNOSIS — Z85828 Personal history of other malignant neoplasm of skin: Secondary | ICD-10-CM | POA: Diagnosis not present

## 2018-12-03 DIAGNOSIS — L089 Local infection of the skin and subcutaneous tissue, unspecified: Secondary | ICD-10-CM | POA: Insufficient documentation

## 2018-12-03 DIAGNOSIS — Z79899 Other long term (current) drug therapy: Secondary | ICD-10-CM | POA: Diagnosis not present

## 2018-12-03 DIAGNOSIS — L729 Follicular cyst of the skin and subcutaneous tissue, unspecified: Secondary | ICD-10-CM | POA: Diagnosis not present

## 2018-12-03 DIAGNOSIS — I10 Essential (primary) hypertension: Secondary | ICD-10-CM | POA: Diagnosis not present

## 2018-12-03 DIAGNOSIS — L723 Sebaceous cyst: Secondary | ICD-10-CM | POA: Diagnosis not present

## 2018-12-03 DIAGNOSIS — L539 Erythematous condition, unspecified: Secondary | ICD-10-CM | POA: Diagnosis present

## 2018-12-03 DIAGNOSIS — Z7982 Long term (current) use of aspirin: Secondary | ICD-10-CM | POA: Insufficient documentation

## 2018-12-03 MED ORDER — DOXYCYCLINE HYCLATE 100 MG PO CAPS: 100.0000 mg | ORAL_CAPSULE | Freq: Two times a day (BID) | ORAL | 0 refills | Status: AC

## 2018-12-03 MED ORDER — LIDOCAINE-EPINEPHRINE 1 %-1:100000 IJ SOLN
10.0000 mL | Freq: Once | INTRAMUSCULAR | Status: AC
  Administered 2018-12-03: 10 mL
  Filled 2018-12-03: qty 1

## 2018-12-03 NOTE — ED Provider Notes (Signed)
[facility] Provider Note   CSN: [id number] Arrival date & time: 12/03/18  1008     History   Chief Complaint Chief Complaint  Patient presents with  . Abscess    HPI Jeffery Cline is a 73 y.o. male.     73 year old male with past medical history below who presents with cyst.  Patient states that he has a history of multiple sebaceous cysts that have previously been removed by general surgery.  He has had a cyst on his left chest for a while that has not been bothering him.  He showed it to his PCP this week at a visit for a different reason and while the PCP was examining it, he felt that it popped on the inside.  Since that examination, the area has grown in size and become red and tender to palpation.  No drainage.  No fevers.  He is almost finished with an azithromycin course for pneumonia.  The history is provided by the patient.  Abscess Associated symptoms: no fever     Past Medical History:  Diagnosis Date  . Allergic rhinitis   . Allergy   . Basal cell carcinoma 2016   Right medial posterior shoulder, shave and ED&C  . Cataract    forming   . Decreased carotid pulse    decreased L carotid pulse (?) : Nl carotidu/s 02-2006    . ED (erectile dysfunction)   . GERD (gastroesophageal reflux disease)   . Glaucoma   . Hyperlipidemia   . Hypertension   . Sleep apnea    on CPAP    Patient Active Problem List   Diagnosis Date Noted  . Cervical spondylosis without myelopathy 06/20/2018  . GERD (gastroesophageal reflux disease) 09/05/2015  . PCP NOTES >>>>>>>>>> 04/30/2015  . Morbid obesity ([place]) 04/30/2015  . OSA (obstructive sleep apnea) 12/06/2014  . Low serum testosterone level 06/20/2014  . Fatigue 06/04/2014  . Personal history of colonic polyps - adenomas 04/21/2011  . Annual physical exam 01/20/2011  . Cough 08/29/2010  . ERECTILE DYSFUNCTION, ORGANIC 10/09/2009  . Hyperlipidemia 06/29/2006  . Essential hypertension  06/29/2006    Past Surgical History:  Procedure Laterality Date  . COLONOSCOPY    . CYSTECTOMY     back, recurrent, last 01-2016  . INGUINAL HERNIA REPAIR     right by dr. Ninfa Linden  . PILONIDAL CYST EXCISION    . POLYPECTOMY    . TONSILLECTOMY          Home Medications    Prior to Admission medications   Medication Sig Start Date End Date Taking? Authorizing Provider  amLODipine (NORVASC) 5 MG tablet Take 1 tablet (5 mg total) by mouth daily. 08/17/18   Colon Branch, MD  aspirin 81 MG tablet Take 81 mg by mouth daily.      [provider]  azithromycin (ZITHROMAX) 250 MG tablet Take 2 tablets by mouth first day, then 1 tablet for 4 additional days 11/30/18   Colon Branch, MD  cefaclor (CECLOR) 500 MG capsule Take 1 capsule (500 mg total) by mouth 3 (three) times daily. 11/30/18   Colon Branch, MD  COMBIGAN 0.2-0.5 % ophthalmic solution INSTILL 1 DROP IN BOTH EYES TWICE A DAY 03/14/15   [provider]  doxycycline (VIBRAMYCIN) 100 MG capsule Take 1 capsule (100 mg total) by mouth 2 (two) times daily for 7 days. 12/03/18 12/10/18  Little, Wenda Overland, MD  fenofibrate (TRICOR) 145 MG tablet Take  1 tablet (145 mg total) by mouth daily. 08/17/18   Colon Branch, MD  metoprolol tartrate (LOPRESSOR) 50 MG tablet Take 1 tablet (50 mg total) by mouth 2 (two) times daily. 06/20/18   Colon Branch, MD  pantoprazole (PROTONIX) 40 MG tablet Take 1 tablet (40 mg total) by mouth 2 (two) times daily before a meal. 11/30/18   Colon Branch, MD  telmisartan (MICARDIS) 80 MG tablet Take 1 tablet (80 mg total) by mouth daily. 08/17/18   Colon Branch, MD  losartan (COZAAR) 100 MG tablet Take 1 tablet (100 mg total) by mouth daily. 08/17/18 11/30/18  Colon Branch, MD    Family History Family History  Problem Relation Age of Onset  . Dementia Mother   . Alcohol abuse Mother   . Coronary artery disease Father 91  . Coronary artery disease Maternal Grandfather   . Leukemia Paternal Grandfather   .  Colon cancer Neg Hx   . Prostate cancer Neg Hx   . Colon polyps Neg Hx   . Rectal cancer Neg Hx   . Stomach cancer Neg Hx     Social History Social History   Tobacco Use  . Smoking status: Never Smoker  . Smokeless tobacco: Never Used  Substance Use Topics  . Alcohol use: Yes    Comment: very rare   . Drug use: No     Allergies   Patient has no known allergies.   Review of Systems Review of Systems  Constitutional: Negative for fever.  Skin: Positive for color change and rash.  Neurological: Negative for weakness.     Physical Exam Updated Vital Signs BP (!) 162/86 (BP Location: Right Arm)   Pulse 63   Temp 98.3 F (36.8 C) (Oral)   Resp 16   Ht 5\' 9"  (1.753 m)   Wt 108 kg   SpO2 96%   BMI 35.15 kg/m   Physical Exam Vitals signs and nursing note reviewed.  Constitutional:      General: He is not in acute distress.    Appearance: He is well-developed.  HENT:     Head: Normocephalic and atraumatic.  Eyes:     Conjunctiva/sclera: Conjunctivae normal.  Neck:     Musculoskeletal: Neck supple.  Skin:    General: Skin is warm and dry.     Comments: 3-4cm area of redness, induration and tenderness on L upper anterior chest wall; no drainage  Neurological:     Mental Status: He is alert and oriented to person, place, and time.  Psychiatric:        Judgment: Judgment normal.      ED Treatments / Results  Labs (all labs ordered are listed, but only abnormal results are displayed) Labs Reviewed - No data to display  EKG None  Radiology No results found.  Procedures .Marland KitchenIncision and Drainage  Date/Time: 12/03/2018 12:03 PM Performed by: Sharlett Iles, MD Authorized by: Sharlett Iles, MD   Consent:    Consent obtained:  Verbal   Consent given by:  Patient   Risks discussed:  Bleeding, infection, incomplete drainage and pain   Alternatives discussed:  No treatment Location:    Type:  Abscess   Size:  3 cm   Location:  Trunk    Trunk location:  Chest Pre-procedure details:    Skin preparation:  Betadine Anesthesia (see MAR for exact dosages):    Anesthesia method:  Local infiltration   Local anesthetic:  Lidocaine 1% WITH epi  Procedure type:    Complexity:  Simple Procedure details:    Incision types:  Single straight   Incision depth:  Dermal   Scalpel blade:  11   Wound management:  Probed and deloculated and irrigated with saline   Drainage:  Purulent   Drainage amount:  Moderate   Wound treatment:  Wound left open   Packing materials:  1/2 in iodoform gauze   Amount 1/2" iodoform:  4 cm Post-procedure details:    Patient tolerance of procedure:  Tolerated well, no immediate complications   (including critical care time)  Medications Ordered in ED Medications  lidocaine-EPINEPHrine (XYLOCAINE W/EPI) 1 %-1:100000 (with pres) injection 10 mL (10 mLs Other Given by Other 12/03/18 1142)     Initial Impression / Assessment and Plan / ED Course  I have reviewed the triage vital signs and the nursing notes.     Suspect sebaceous cyst that has become infected. Recommended I&D, see procedure note for details. Placed packing and discussed wound care at home. Pt will f/u with general surgery for eventual cyst removal. Because of surrounding cellulitic changes,will cover w/ doxycycline. He voiced understanding of return precautions.  Final Clinical Impressions(s) / ED Diagnoses   Final diagnoses:  Infected cyst of skin    ED Discharge Orders         Ordered    doxycycline (VIBRAMYCIN) 100 MG capsule  2 times daily     12/03/18 1202           Little, Wenda Overland, MD 12/03/18 1204

## 2018-12-03 NOTE — ED Triage Notes (Signed)
Presents with redness, swelling and pain to the left breast. He has a history of same. He is currently taking a z pack for CAP. He denies fevers or feeling ill.

## 2018-12-05 DIAGNOSIS — C4491 Basal cell carcinoma of skin, unspecified: Secondary | ICD-10-CM | POA: Insufficient documentation

## 2018-12-07 DIAGNOSIS — L720 Epidermal cyst: Secondary | ICD-10-CM | POA: Diagnosis not present

## 2018-12-07 DIAGNOSIS — D1801 Hemangioma of skin and subcutaneous tissue: Secondary | ICD-10-CM | POA: Diagnosis not present

## 2018-12-07 DIAGNOSIS — D225 Melanocytic nevi of trunk: Secondary | ICD-10-CM | POA: Diagnosis not present

## 2018-12-07 DIAGNOSIS — L821 Other seborrheic keratosis: Secondary | ICD-10-CM | POA: Diagnosis not present

## 2018-12-07 DIAGNOSIS — L814 Other melanin hyperpigmentation: Secondary | ICD-10-CM | POA: Diagnosis not present

## 2018-12-07 DIAGNOSIS — Z85828 Personal history of other malignant neoplasm of skin: Secondary | ICD-10-CM | POA: Diagnosis not present

## 2018-12-16 ENCOUNTER — Other Ambulatory Visit: Payer: Self-pay | Admitting: Internal Medicine

## 2018-12-19 ENCOUNTER — Ambulatory Visit: Payer: Medicare HMO | Admitting: Internal Medicine

## 2018-12-20 DIAGNOSIS — Z85828 Personal history of other malignant neoplasm of skin: Secondary | ICD-10-CM | POA: Diagnosis not present

## 2018-12-20 DIAGNOSIS — C441192 Basal cell carcinoma of skin of left lower eyelid, including canthus: Secondary | ICD-10-CM | POA: Diagnosis not present

## 2018-12-23 ENCOUNTER — Encounter: Payer: Self-pay | Admitting: Internal Medicine

## 2018-12-23 DIAGNOSIS — H353131 Nonexudative age-related macular degeneration, bilateral, early dry stage: Secondary | ICD-10-CM | POA: Diagnosis not present

## 2018-12-23 DIAGNOSIS — H25813 Combined forms of age-related cataract, bilateral: Secondary | ICD-10-CM | POA: Diagnosis not present

## 2018-12-23 DIAGNOSIS — H401132 Primary open-angle glaucoma, bilateral, moderate stage: Secondary | ICD-10-CM | POA: Diagnosis not present

## 2018-12-23 LAB — HM DIABETES EYE EXAM

## 2018-12-30 ENCOUNTER — Other Ambulatory Visit: Payer: Self-pay

## 2019-01-02 ENCOUNTER — Ambulatory Visit (INDEPENDENT_AMBULATORY_CARE_PROVIDER_SITE_OTHER): Payer: Medicare HMO | Admitting: Internal Medicine

## 2019-01-02 ENCOUNTER — Other Ambulatory Visit: Payer: Self-pay

## 2019-01-02 ENCOUNTER — Ambulatory Visit (HOSPITAL_BASED_OUTPATIENT_CLINIC_OR_DEPARTMENT_OTHER)
Admission: RE | Admit: 2019-01-02 | Discharge: 2019-01-02 | Disposition: A | Discharge status: Home / Self Care | Payer: Medicare HMO | Source: Ambulatory Visit | Attending: Internal Medicine | Admitting: Internal Medicine

## 2019-01-02 ENCOUNTER — Encounter: Payer: Self-pay | Admitting: Internal Medicine

## 2019-01-02 VITALS — BP 141/63 | HR 59 | Temp 96.8°F | Resp 16 | Ht 69.0 in | Wt 238.1 lb

## 2019-01-02 DIAGNOSIS — J189 Pneumonia, unspecified organism: Secondary | ICD-10-CM | POA: Diagnosis not present

## 2019-01-02 DIAGNOSIS — E119 Type 2 diabetes mellitus without complications: Secondary | ICD-10-CM

## 2019-01-02 DIAGNOSIS — R918 Other nonspecific abnormal finding of lung field: Secondary | ICD-10-CM

## 2019-01-02 DIAGNOSIS — I10 Essential (primary) hypertension: Secondary | ICD-10-CM

## 2019-01-02 DIAGNOSIS — R9389 Abnormal findings on diagnostic imaging of other specified body structures: Secondary | ICD-10-CM | POA: Diagnosis not present

## 2019-01-02 LAB — BASIC METABOLIC PANEL
BUN: 15 mg/dL (ref 6–23)
CO2: 29 mEq/L (ref 19–32)
Calcium: 9.2 mg/dL (ref 8.4–10.5)
Chloride: 104 mEq/L (ref 96–112)
Creatinine, Ser: 0.88 mg/dL (ref 0.40–1.50)
GFR: 84.75 mL/min (ref >60.00)
Glucose, Bld: 152 mg/dL — ABNORMAL HIGH (ref 70–99)
Potassium: 3.7 mEq/L (ref 3.5–5.1)
Sodium: 140 mEq/L (ref 135–145)

## 2019-01-02 LAB — HEMOGLOBIN A1C: Hgb A1c MFr Bld: 6.8 % — ABNORMAL HIGH (ref 4.6–6.5)

## 2019-01-02 NOTE — Progress Notes (Signed)
Subjective:    Patient ID: Jeffery Cline, male    DOB: 1945/11/17, 73 y.o.   MRN: [id number]  DOS:  01/02/2019 Type of visit - description: Follow-up Pneumonia: Status post antibiotic, symptoms gradually improved. HTN: Good med compliance, no apparent side effects DM: Diet improved, eating less sweets.  He remains active except for the time he had pneumonia BCC: Had Mohs surgery, recuperating well.   Review of Systems Denies fever chills No chest pain, lower extremity edema. No cough No nausea, vomiting, diarrhea  Past Medical History:  Diagnosis Date  . Allergic rhinitis   . Allergy   . Basal cell carcinoma 2016   Right medial posterior shoulder, shave and ED&C  . Cataract    forming   . Decreased carotid pulse    decreased L carotid pulse (?) : Nl carotidu/s 02-2006    . ED (erectile dysfunction)   . GERD (gastroesophageal reflux disease)   . Glaucoma   . Hyperlipidemia   . Hypertension   . Sleep apnea    on CPAP    Past Surgical History:  Procedure Laterality Date  . COLONOSCOPY    . CYSTECTOMY     back, recurrent, last 01-2016  . INGUINAL HERNIA REPAIR     right by dr. Ninfa Linden  . MOHS SURGERY Left 11/2018   face, betwen nose-ear   . PILONIDAL CYST EXCISION    . POLYPECTOMY    . TONSILLECTOMY      Social History   Socioeconomic History  . Marital status: Married    Spouse name: Not on file  . Number of children: 2  . Years of education: Not on file  . Highest education level: Not on file  Occupational History  . Occupation: retired  Scientific laboratory technician  . Financial resource strain: Not on file  . Food insecurity    Worry: Not on file    Inability: Not on file  . Transportation needs    Medical: Not on file    Non-medical: Not on file  Tobacco Use  . Smoking status: Never Smoker  . Smokeless tobacco: Never Used  Substance and Sexual Activity  . Alcohol use: Yes    Comment: very rare   . Drug use: No  . Sexual activity: Not Currently   Lifestyle  . Physical activity    Days per week: Not on file    Minutes per session: Not on file  . Stress: Not on file  Relationships  . Social Herbalist on phone: Not on file    Gets together: Not on file    Attends religious service: Not on file    Active member of club or organization: Not on file    Attends meetings of clubs or organizations: Not on file    Relationship status: Not on file  . Intimate partner violence    Fear of current or ex partner: Not on file    Emotionally abused: Not on file    Physically abused: Not on file    Forced sexual activity: Not on file  Other Topics Concern  . Not on file  Social History Narrative   Married. 2 sons ([place] and [place]) , 5 grandkids        Allergies as of 01/02/2019   No Known Allergies     Medication List       Accurate as of January 02, 2019 11:59 PM. If you have any questions, ask your nurse or doctor.  STOP taking these medications   azithromycin 250 MG tablet Commonly known as: ZITHROMAX Stopped by: Kathlene November, MD   cefaclor 500 MG capsule Commonly known as: Air traffic controller Stopped by: Kathlene November, MD     TAKE these medications   amLODipine 5 MG tablet Commonly known as: NORVASC Take 1 tablet (5 mg total) by mouth daily.   aspirin 81 MG tablet Take 81 mg by mouth daily.   Combigan 0.2-0.5 % ophthalmic solution Generic drug: brimonidine-timolol INSTILL 1 DROP IN BOTH EYES TWICE A DAY   fenofibrate 145 MG tablet Commonly known as: TRICOR Take 1 tablet (145 mg total) by mouth daily.   metoprolol tartrate 50 MG tablet Commonly known as: LOPRESSOR Take 1 tablet (50 mg total) by mouth 2 (two) times daily.   pantoprazole 40 MG tablet Commonly known as: PROTONIX Take 1 tablet (40 mg total) by mouth 2 (two) times daily before a meal.   telmisartan 80 MG tablet Commonly known as: MICARDIS Take 1 tablet (80 mg total) by mouth daily.           Objective:   Physical Exam BP (!) 141/63 (BP  Location: Left Arm, Patient Position: Sitting, Cuff Size: Normal)   Pulse (!) 59   Temp (!) 96.8 F (36 C) (Temporal)   Resp 16   Ht 5\' 9"  (1.753 m)   Wt 238 lb 2 oz (108 kg)   SpO2 97%   BMI 35.16 kg/m  General:   Well developed, NAD, BMI noted. HEENT:  Normocephalic . Face symmetric, atraumatic Lungs:  CTA B Normal respiratory effort, no intercostal retractions, no accessory muscle use. Heart: RRR,  no murmur.  No pretibial edema bilaterally  Skin: Not pale. Not jaundice Neurologic:  alert & oriented X3.  Speech normal, gait appropriate for age and unassisted Psych--  Cognition and judgment appear intact.  Cooperative with normal attention span and concentration.  Behavior appropriate. No anxious or depressed appearing.      Assessment      Assessment DM  A1c 6.5 (05-2016)  Morbid obesity HTN Hyperlipidemia Low testosterone: Saw endocrinology 06/2014 , mild hypogonadotropic hypogonadism. no rx recommended  Decreased libido , ED: not interested in Rx Low iron 2016: rechecked >> normal  OSA -- on CPAP , rx Dr Elsworth Soho GERD ( dx based on response to cough to PPIs) Decreased L carotid pulse? Normal carotid ultrasound 2008 [facility] 2016- sees derm q year Dr Ronnald Ramp as off 08/2018 Glaucoma    BPH BCC, face   PLAN: DM: Diet controlled, last A1c 7.1, goal close to 6.5.  Checking A1c, if A1c is about the same as before he would like to work on diet and exercise before he goes on medication. HTN: Continue amlodipine, metoprolol, telmisartan.  Check a BMP PNM: Resolved, check chest x-ray follow-up. BCC: Status post Mohs surgery at the left face RTC CPX 08-2019, sooner if needed.    This visit occurred during the SARS-CoV-2 public health emergency.  Safety protocols were in place, including screening questions prior to the visit, additional usage of staff PPE, and extensive cleaning of exam room while observing appropriate contact time as indicated for disinfecting solutions.

## 2019-01-02 NOTE — Patient Instructions (Addendum)
Please schedule Medicare Wellness with Glenard Haring.   Per our records you are due for an eye exam. Please contact your eye doctor to schedule an appointment. Please have them send copies of your office visit notes to [place]. Our fax number is [phone].   GO TO THE LAB : Get the blood work     GO TO THE FRONT DESK Schedule your next appointment   for a physical exam by 08-2019

## 2019-01-02 NOTE — Progress Notes (Signed)
Pre visit review using our clinic review tool, if applicable. No additional management support is needed unless otherwise documented below in the visit note. 

## 2019-01-03 NOTE — Assessment & Plan Note (Signed)
DM: Diet controlled, last A1c 7.1, goal close to 6.5.  Checking A1c, if A1c is about the same as before he would like to work on diet and exercise before he goes on medication. HTN: Continue amlodipine, metoprolol, telmisartan.  Check a BMP PNM: Resolved, check chest x-ray follow-up. BCC: Status post Mohs surgery at the left face RTC CPX 08-2019, sooner if needed.

## 2019-01-04 NOTE — Addendum Note (Signed)
Addended byDamita Dunnings D on: 01/04/2019 07:58 AM   Modules accepted: Orders

## 2019-01-11 ENCOUNTER — Telehealth: Payer: Self-pay | Admitting: Internal Medicine

## 2019-01-11 ENCOUNTER — Ambulatory Visit (HOSPITAL_BASED_OUTPATIENT_CLINIC_OR_DEPARTMENT_OTHER)
Admission: RE | Admit: 2019-01-11 | Discharge: 2019-01-11 | Disposition: A | Discharge status: Home / Self Care | Payer: Medicare HMO | Source: Ambulatory Visit | Attending: Internal Medicine | Admitting: Internal Medicine

## 2019-01-11 ENCOUNTER — Other Ambulatory Visit: Payer: Self-pay

## 2019-01-11 DIAGNOSIS — R918 Other nonspecific abnormal finding of lung field: Secondary | ICD-10-CM | POA: Diagnosis not present

## 2019-01-11 DIAGNOSIS — R9389 Abnormal findings on diagnostic imaging of other specified body structures: Secondary | ICD-10-CM

## 2019-01-11 MED ORDER — DOXYCYCLINE HYCLATE 100 MG PO TABS: 100.0000 mg | ORAL_TABLET | Freq: Two times a day (BID) | ORAL | 0 refills | Status: DC

## 2019-01-11 NOTE — Telephone Encounter (Signed)
Advise patient, CT showing ongoing inflammation at the right upper chest, recommend the following: Doxycycline 100 mg twice a day for 1 week, please send a Rx Pulmonary referral: Abnormal CT chest

## 2019-01-11 NOTE — Telephone Encounter (Signed)
Spoke w/ Pt- informed of results and recommendations. Doxycycline sent to [facility]. Pt requesting to see Dr. Elsworth Soho- which he see's already for his sleep apnea- informed I'd place referral and his office will call regarding this issue to schedule appt. Pt verbalized understanding.

## 2019-01-12 DIAGNOSIS — G4733 Obstructive sleep apnea (adult) (pediatric): Secondary | ICD-10-CM | POA: Diagnosis not present

## 2019-01-16 ENCOUNTER — Encounter: Payer: Self-pay | Admitting: Internal Medicine

## 2019-01-23 ENCOUNTER — Encounter: Payer: Self-pay | Admitting: Pulmonary Disease

## 2019-01-23 ENCOUNTER — Ambulatory Visit (INDEPENDENT_AMBULATORY_CARE_PROVIDER_SITE_OTHER): Payer: Medicare HMO | Admitting: Pulmonary Disease

## 2019-01-23 ENCOUNTER — Other Ambulatory Visit: Payer: Self-pay

## 2019-01-23 DIAGNOSIS — G4733 Obstructive sleep apnea (adult) (pediatric): Secondary | ICD-10-CM

## 2019-01-23 DIAGNOSIS — R911 Solitary pulmonary nodule: Secondary | ICD-10-CM | POA: Diagnosis not present

## 2019-01-23 NOTE — Assessment & Plan Note (Signed)
CPAP is set at 8 cm and is working well. CPAP supplies will be renewed for a year This is really helped improve his daytime somnolence and fatigue and he is very compliant  Weight loss encouraged, compliance with goal of at least 4-6 hrs every night is the expectation. Advised against medications with sedative side effects Cautioned against driving when sleepy - understanding that sleepiness will vary on a day to day basis

## 2019-01-23 NOTE — Addendum Note (Signed)
Addended by: Nena Polio on: 01/23/2019 04:23 PM   Modules accepted: Orders

## 2019-01-23 NOTE — Assessment & Plan Note (Signed)
Findings on CT may have been all reflux induced. Low risk for malignancy in this never smoker -incidental finding of subcentimeter nodules in the right upper lobe Follow-up CT chest in 1 year without contrast

## 2019-01-23 NOTE — Progress Notes (Signed)
   Subjective:    Patient ID: ANTOINE Cline, male    DOB: Jun 17, 1945, 73 y.o.   MRN: [id number]  HPI  73 yo hypertensive for finding of incidental nodules on CT scan Also for FU of OSA.   He had reflux-like symptoms 1 night followed by dry cough and throat clearing Chest x-ray 11/30/2018 showed lingular infiltrate.  He was treated with antibiotics with his PCP. No fevers or sick contacts or other systemic symptoms. Follow-up chest x-ray 11/23 showed resolution of lingular airspace disease but suggested retrosternal nodular density. Hence CT chest was performed which showed a cluster of 2 to 3 mm nodules in the right upper lobe and another 4 mm nodule -hence is referred to [place].  He denies any new symptoms and his cough is completely resolved.  In fact he feels the best he has in several years.  He has severe reflux for which she has been on Protonix for many years.  He is mostly compliant with diet he lives with his wife and has been social distancing and masking appropriately during the Covid pandemic.  None of his family members including grandchildren have been sick.  No problems with mask or pressure.  He is very compliant. CPAP download was reviewed which shows excellent control of events on 8 cm with no leak and good compliance   Significant tests/ events reviewed  HST 07/2014 showed moderate OSA with AHI 27/hour and lowest desaturation of 79%. Total sleep time was 8 hours.  Past Medical History:  Diagnosis Date  . Allergic rhinitis   . Allergy   . Basal cell carcinoma 2016   Right medial posterior shoulder, shave and ED&C  . Cataract    forming   . Decreased carotid pulse    decreased L carotid pulse (?) : Nl carotidu/s 02-2006    . ED (erectile dysfunction)   . GERD (gastroesophageal reflux disease)   . Glaucoma   . Hyperlipidemia   . Hypertension   . Sleep apnea    on CPAP     Review of Systems neg for any significant sore throat, dysphagia, itching, sneezing,  nasal congestion or excess/ purulent secretions, fever, chills, sweats, unintended wt loss, pleuritic or exertional cp, hempoptysis, orthopnea pnd or change in chronic leg swelling. Also denies presyncope, palpitations, heartburn, abdominal pain, nausea, vomiting, diarrhea or change in bowel or urinary habits, dysuria,hematuria, rash, arthralgias, visual complaints, headache, numbness weakness or ataxia.     Objective:   Physical Exam  Gen. Pleasant, obese, in no distress, normal affect ENT - no pallor,icterus, no post nasal drip, class 2-3 airway Neck: No JVD, no thyromegaly, no carotid bruits Lungs: no use of accessory muscles, no dullness to percussion, decreased without rales or rhonchi  Cardiovascular: Rhythm regular, heart sounds  normal, no murmurs or gallops, no peripheral edema Abdomen: soft and non-tender, no hepatosplenomegaly, BS normal. Musculoskeletal: No deformities, no cyanosis or clubbing Neuro:  alert, non focal, no tremors       Assessment & Plan:

## 2019-01-23 NOTE — Patient Instructions (Signed)
Findings on CT may have been all reflux induced. Follow-up CT chest in 1 year without contrast  CPAP machine is set at 8 cm and is working great

## 2019-01-26 ENCOUNTER — Encounter: Payer: Self-pay | Admitting: Internal Medicine

## 2019-01-26 DIAGNOSIS — L720 Epidermal cyst: Secondary | ICD-10-CM | POA: Diagnosis not present

## 2019-01-26 DIAGNOSIS — Z85828 Personal history of other malignant neoplasm of skin: Secondary | ICD-10-CM | POA: Diagnosis not present

## 2019-02-14 ENCOUNTER — Other Ambulatory Visit: Payer: Self-pay | Admitting: Internal Medicine

## 2019-02-22 ENCOUNTER — Other Ambulatory Visit: Payer: Self-pay | Admitting: Internal Medicine

## 2019-02-28 ENCOUNTER — Telehealth: Payer: Self-pay | Admitting: Internal Medicine

## 2019-02-28 NOTE — Telephone Encounter (Signed)
Spoke with Jeffery Cline regarding AWV. He stated that he will give the office a call when he is ready to schedule.

## 2019-03-05 ENCOUNTER — Other Ambulatory Visit: Payer: Self-pay | Admitting: Internal Medicine

## 2019-03-13 ENCOUNTER — Ambulatory Visit: Payer: Medicare HMO | Admitting: Adult Health

## 2019-04-05 ENCOUNTER — Encounter: Payer: Self-pay | Admitting: Internal Medicine

## 2019-06-05 ENCOUNTER — Encounter: Payer: Self-pay | Admitting: Internal Medicine

## 2019-06-05 DIAGNOSIS — H353190 Nonexudative age-related macular degeneration, unspecified eye, stage unspecified: Secondary | ICD-10-CM

## 2019-06-05 DIAGNOSIS — H409 Unspecified glaucoma: Secondary | ICD-10-CM

## 2019-06-05 DIAGNOSIS — H3530 Unspecified macular degeneration: Secondary | ICD-10-CM | POA: Insufficient documentation

## 2019-06-09 ENCOUNTER — Other Ambulatory Visit: Payer: Self-pay | Admitting: Internal Medicine

## 2019-07-08 ENCOUNTER — Telehealth: Payer: Medicare HMO | Admitting: Nurse Practitioner

## 2019-07-08 DIAGNOSIS — J0100 Acute maxillary sinusitis, unspecified: Secondary | ICD-10-CM | POA: Diagnosis not present

## 2019-07-08 MED ORDER — AMOXICILLIN-POT CLAVULANATE 875-125 MG PO TABS: 1.0000 | ORAL_TABLET | Freq: Two times a day (BID) | ORAL | 0 refills | Status: DC

## 2019-07-08 NOTE — Progress Notes (Signed)

## 2019-08-02 ENCOUNTER — Ambulatory Visit: Payer: Medicare HMO | Admitting: Internal Medicine

## 2019-08-07 ENCOUNTER — Ambulatory Visit (INDEPENDENT_AMBULATORY_CARE_PROVIDER_SITE_OTHER): Payer: Medicare HMO | Admitting: Internal Medicine

## 2019-08-07 ENCOUNTER — Other Ambulatory Visit: Payer: Self-pay

## 2019-08-07 ENCOUNTER — Encounter: Payer: Self-pay | Admitting: Internal Medicine

## 2019-08-07 VITALS — BP 120/75 | HR 55 | Temp 95.4°F | Resp 18 | Ht 69.0 in | Wt 239.2 lb

## 2019-08-07 DIAGNOSIS — H6123 Impacted cerumen, bilateral: Secondary | ICD-10-CM

## 2019-08-07 DIAGNOSIS — E119 Type 2 diabetes mellitus without complications: Secondary | ICD-10-CM | POA: Diagnosis not present

## 2019-08-07 DIAGNOSIS — E785 Hyperlipidemia, unspecified: Secondary | ICD-10-CM | POA: Diagnosis not present

## 2019-08-07 DIAGNOSIS — I10 Essential (primary) hypertension: Secondary | ICD-10-CM

## 2019-08-07 LAB — COMPREHENSIVE METABOLIC PANEL
ALT: 28 U/L (ref 0–53)
AST: 19 U/L (ref 0–37)
Albumin: 4.5 g/dL (ref 3.5–5.2)
Alkaline Phosphatase: 51 U/L (ref 39–117)
BUN: 16 mg/dL (ref 6–23)
CO2: 27 mEq/L (ref 19–32)
Calcium: 9.4 mg/dL (ref 8.4–10.5)
Chloride: 103 mEq/L (ref 96–112)
Creatinine, Ser: 0.92 mg/dL (ref 0.40–1.50)
GFR: 80.38 mL/min (ref >60.00)
Glucose, Bld: 173 mg/dL — ABNORMAL HIGH (ref 70–99)
Potassium: 4.2 mEq/L (ref 3.5–5.1)
Sodium: 138 mEq/L (ref 135–145)
Total Bilirubin: 0.6 mg/dL (ref 0.2–1.2)
Total Protein: 6.7 g/dL (ref 6.0–8.3)

## 2019-08-07 LAB — LIPID PANEL
Cholesterol: 206 mg/dL — ABNORMAL HIGH (ref 0–200)
HDL: 27.5 mg/dL — ABNORMAL LOW (ref >39.00)
NonHDL: 178.29
Total CHOL/HDL Ratio: 7
Triglycerides: 233 mg/dL — ABNORMAL HIGH (ref 0.0–149.0)
VLDL: 46.6 mg/dL — ABNORMAL HIGH (ref 0.0–40.0)

## 2019-08-07 LAB — HEMOGLOBIN A1C: Hgb A1c MFr Bld: 8.2 % — ABNORMAL HIGH (ref 4.6–6.5)

## 2019-08-07 LAB — LDL CHOLESTEROL, DIRECT: Direct LDL: 136 mg/dL

## 2019-08-07 NOTE — Progress Notes (Signed)
Pre visit review using our clinic review tool, if applicable. No additional management support is needed unless otherwise documented below in the visit note. 

## 2019-08-07 NOTE — Patient Instructions (Addendum)
Please schedule Medicare Wellness with Jeffery Cline.   Continue checking your blood pressure BP GOAL is between 110/65 and  135/85. If it is consistently higher or lower, let me know   GO TO THE LAB : Get the blood work     [place], [place] back for a physical exam in 6 months

## 2019-08-07 NOTE — Progress Notes (Signed)
Subjective:    Patient ID: Jeffery Cline, male    DOB: [birth year], 74 y.o.   MRN: [id number]  DOS:  08/07/2019 Type of visit - description: Routine visit Today with talk about hypertension, cholesterol and diabetes. He also complains of abundant ear wax all the time despite using OTC drops. Occasionally feels fatigue but he is very active, walks 2 miles  5 times a week without chest pain or DOE. No cough No lower extremity edema GERD symptoms essentially resolved    Review of Systems See above   Past Medical History:  Diagnosis Date  . Allergic rhinitis   . Allergy   . Basal cell carcinoma 2016   Right medial posterior shoulder, shave and ED&C  . Cataract    forming   . Decreased carotid pulse    decreased L carotid pulse (?) : Nl carotidu/s 02-2006    . ED (erectile dysfunction)   . GERD (gastroesophageal reflux disease)   . Glaucoma   . Hyperlipidemia   . Hypertension   . Sleep apnea    on CPAP    Past Surgical History:  Procedure Laterality Date  . COLONOSCOPY    . CYSTECTOMY     back, recurrent, last 01-2016  . INGUINAL HERNIA REPAIR     right by dr. Ninfa Linden  . MOHS SURGERY Left 11/2018   face, betwen nose-ear   . PILONIDAL CYST EXCISION    . POLYPECTOMY    . TONSILLECTOMY      Allergies as of 08/07/2019   No Known Allergies     Medication List       Accurate as of August 07, 2019  8:34 AM. If you have any questions, ask your nurse or doctor.        STOP taking these medications   losartan 100 MG tablet Commonly known as: COZAAR Stopped by: Kathlene November, MD     TAKE these medications   amLODipine 5 MG tablet Commonly known as: NORVASC Take 1 tablet (5 mg total) by mouth daily.   amoxicillin-clavulanate 875-125 MG tablet Commonly known as: AUGMENTIN Take 1 tablet by mouth 2 (two) times daily.   aspirin 81 MG tablet Take 81 mg by mouth daily.   Combigan 0.2-0.5 % ophthalmic solution Generic drug: brimonidine-timolol INSTILL 1 DROP IN  BOTH EYES TWICE A DAY   fenofibrate 145 MG tablet Commonly known as: TRICOR TAKE 1 TABLET BY MOUTH EVERY DAY   metoprolol tartrate 50 MG tablet Commonly known as: LOPRESSOR Take 1 tablet (50 mg total) by mouth 2 (two) times daily.   pantoprazole 40 MG tablet Commonly known as: PROTONIX Take 1 tablet (40 mg total) by mouth daily before breakfast.   telmisartan 80 MG tablet Commonly known as: MICARDIS Take 1 tablet (80 mg total) by mouth daily.          Objective:   Physical Exam BP 120/75 (BP Location: Left Arm, Patient Position: Sitting, Cuff Size: Normal)   Pulse (!) 55   Temp (!) 95.4 F (35.2 C) (Temporal)   Resp 18   Ht 5\' 9"  (1.753 m)   Wt 239 lb 4 oz (108.5 kg)   SpO2 96%   BMI 35.33 kg/m  General:   Well developed, NAD, BMI noted. HEENT:  Normocephalic . Face symmetric, atraumatic Ears: Cerumen impaction on the left, abundant wax on the right. Lungs:  CTA B Normal respiratory effort, no intercostal retractions, no accessory muscle use. Heart: RRR,  no murmur.  Lower extremities: no  pretibial edema bilaterally  Skin: Not pale. Not jaundice Neurologic:  alert & oriented X3.  Speech normal, gait appropriate for age and unassisted Psych--  Cognition and judgment appear intact.  Cooperative with normal attention span and concentration.  Behavior appropriate. No anxious or depressed appearing.      Assessment     Assessment DM  A1c 6.5 (05-2016)  Morbid obesity HTN Hyperlipidemia Low testosterone: Saw endocrinology 06/2014 , mild hypogonadotropic hypogonadism. no rx recommended  Decreased libido , ED: not interested in Rx Low iron 2016: rechecked >> normal  OSA -- on CPAP , rx Dr Elsworth Soho GERD ( dx based on response to cough to PPIs) Decreased L carotid pulse? Normal carotid ultrasound 2008 [facility] 2016- sees derm q year Dr Ronnald Ramp as off 08/2018 Glaucoma    BPH    PLAN: DM: Diet controlled, no ambulatory CBGs, he remains active. Last A1c okay, recheck  A1c. [id number] amlodipine, Micardis, metoprolol. Check a CMP. High cholesterol: On fenofibrate: Check FLP Cerumen impaction: Abundant hard cerumen removed from left ear with a spoon and  forceps. Minimal residual wax noted.. Unable to remove cerumen from the right. Recommend peroxide daily for a week, then try to self flush. GERD: Much improved, not taking Protonix regularly, has a leftover that he takes from time to time. Preventive care: Had Covid shots already RTC CPX 6 months  This visit occurred during the SARS-CoV-2 public health emergency.  Safety protocols were in place, including screening questions prior to the visit, additional usage of staff PPE, and extensive cleaning of exam room while observing appropriate contact time as indicated for disinfecting solutions.

## 2019-08-08 NOTE — Assessment & Plan Note (Signed)
DM: Diet controlled, no ambulatory CBGs, he remains active. Last A1c okay, recheck A1c. [id number] amlodipine, Micardis, metoprolol. Check a CMP. High cholesterol: On fenofibrate: Check FLP Cerumen impaction: Abundant hard cerumen removed from left ear with a spoon and  forceps. Minimal residual wax noted.. Unable to remove cerumen from the right. Recommend peroxide daily for a week, then try to self flush. GERD: Much improved, not taking Protonix regularly, has a leftover that he takes from time to time. Preventive care: Had Covid shots already RTC CPX 6 months

## 2019-08-09 MED ORDER — METFORMIN HCL 1000 MG PO TABS: ORAL_TABLET | ORAL | 3 refills | Status: DC

## 2019-08-09 NOTE — Addendum Note (Signed)
Addended byDamita Dunnings D on: 08/09/2019 02:03 PM   Modules accepted: Orders

## 2019-08-11 ENCOUNTER — Other Ambulatory Visit: Payer: Self-pay | Admitting: Internal Medicine

## 2019-10-07 ENCOUNTER — Encounter: Payer: Self-pay | Admitting: Physician Assistant

## 2019-10-07 ENCOUNTER — Telehealth: Payer: Medicare HMO | Admitting: Physician Assistant

## 2019-10-07 DIAGNOSIS — R0982 Postnasal drip: Secondary | ICD-10-CM

## 2019-10-07 DIAGNOSIS — J029 Acute pharyngitis, unspecified: Secondary | ICD-10-CM | POA: Diagnosis not present

## 2019-10-07 MED ORDER — AMOXICILLIN 500 MG PO CAPS: 500.0000 mg | ORAL_CAPSULE | Freq: Two times a day (BID) | ORAL | 0 refills | Status: DC

## 2019-10-07 NOTE — Progress Notes (Signed)
We are sorry that you are not feeling well.  Here is how we plan to help!  Your symptoms could be secondary to a viral infection (Pharyngitis).   Pharyngitis is inflammation in the back of the throat which can cause a sore throat, scratchiness and sometimes difficulty swallowing.   Pharyngitis is typically caused by a respiratory virus and will just run its course.  Please keep in mind that your symptoms could last up to 10 days.  For throat pain, we recommend over the counter oral pain relief medications such as acetaminophen or aspirin, or anti-inflammatory medications such as ibuprofen or naproxen sodium.  Topical treatments such as oral throat lozenges or sprays may be used as needed.  Avoid close contact with loved ones, especially the very young and elderly.  Remember to wash your hands thoroughly throughout the day as this is the number one way to prevent the spread of infection and wipe down door knobs and counters with disinfectant.   Although this may be viral in nature, due to the chronicity and worsening of your symptoms, I have prescribed antibiotic for you. The medication is Amoxicillin 500 mg one pill twice daily for 10 days.    Providers prescribe antibiotics to treat infections caused by bacteria. Antibiotics are very powerful in treating bacterial infections when they are used properly.  To maintain their effectiveness, they should be used only when necessary.  Overuse of antibiotics has resulted in the development of super bugs that are resistant to treatment!    Home Care:  Only take medications as instructed by your medical team.  Do not drink alcohol while taking these medications.  A steam or ultrasonic humidifier can help congestion.  You can place a towel over your head and breathe in the steam from hot water coming from a faucet.  Avoid close contacts especially the very young and the elderly.  Cover your mouth when you cough or sneeze.  Always remember to wash your  hands.  Get Help Right Away If:  You develop worsening fever or throat pain.  You develop a severe head ache or visual changes.  Your symptoms persist after you have completed your treatment plan.  Make sure you  Understand these instructions.  Will watch your condition.  Will get help right away if you are not doing well or get worse.  Your e-visit answers were reviewed by a board certified advanced clinical practitioner to complete your personal care plan.  Depending on the condition, your plan could have included both over the counter or prescription medications.  If there is a problem please reply  once you have received a response from your provider.  Your safety is important to [place].  If you have drug allergies check your prescription carefully.    You can use MyChart to ask questions about todays visit, request a non-urgent call back, or ask for a work or school excuse for 24 hours related to this e-Visit. If it has been greater than 24 hours you will need to follow up with your provider, or enter a new e-Visit to address those concerns.  You will get an e-mail in the next two days asking about your experience.  I hope that your e-visit has been valuable and will speed your recovery. Thank you for using e-visits.   I spent 5-10 minutes on review and completion of this note- Lacy Duverney [facility]

## 2019-10-12 ENCOUNTER — Telehealth (INDEPENDENT_AMBULATORY_CARE_PROVIDER_SITE_OTHER): Payer: Medicare HMO | Admitting: Family Medicine

## 2019-10-12 ENCOUNTER — Other Ambulatory Visit: Payer: Self-pay

## 2019-10-12 DIAGNOSIS — R519 Headache, unspecified: Secondary | ICD-10-CM

## 2019-10-12 DIAGNOSIS — R0981 Nasal congestion: Secondary | ICD-10-CM | POA: Diagnosis not present

## 2019-10-12 DIAGNOSIS — R05 Cough: Secondary | ICD-10-CM

## 2019-10-12 DIAGNOSIS — R059 Cough, unspecified: Secondary | ICD-10-CM

## 2019-10-12 MED ORDER — DOXYCYCLINE HYCLATE 100 MG PO TABS: 100.0000 mg | ORAL_TABLET | Freq: Two times a day (BID) | ORAL | 0 refills | Status: DC

## 2019-10-12 NOTE — Progress Notes (Signed)
Virtual Visit via Telephone Note  I connected with Jeffery Cline on 10/12/19 at  1:00 PM EDT by telephone and verified that I am speaking with the correct person using two identifiers.   I discussed the limitations, risks, security and privacy concerns of performing an evaluation and management service by telephone and the availability of in person appointments. I also discussed with the patient that there may be a patient responsible charge related to this service. The patient expressed understanding and agreed to proceed.  Location patient: home. [place] Location provider: work or home office Participants present for the call: patient, provider Patient did not have a visit in the prior 7 days to address this/these issue(s).   History of Present Illness:   Acute visit for cough and sinus issues: -started 3-4 weeks ago -has had issues about 1x per year with this -he has had acid reflux, sinusitis and pneumonia in the past -symptoms: drainage in the throat, sinus congestion and discomfort, cough - worsening, some wheezing - mild -denies fevers, sob, body aches, malaise -he tried restarting his reflux medicine - but that did not help -he did an e-visit and was prescribed amoxicillin, but this has not helped and is getting worse - feels like he needs a stronger antibiotic -has had his pfizer vaccines for COVID19 -he feels like inhalers make things worse when he get this     Observations/Objective: Patient sounds cheerful and well on the phone. I do not appreciate any SOB. Speech and thought processing are grossly intact. Patient reported vitals:  Assessment and Plan:  Sinus congestion  Facial discomfort  Cough  -we discussed possible serious and likely etiologies, options for evaluation and workup, limitations of telemedicine visit vs in person visit, treatment, treatment risks and precautions. Pt prefers to treat via telemedicine empirically rather then risking or undertaking  an in person visit at this moment. Query developing sinusitis or LRI given the duration of his symptoms. Will broaden abx coverage with trial of doxy 100mg  bid x 10 days and stopping the amoxicillin. Query touch of bronchitis, but he does not feel that inhalers have ever been helpful when he had similar symptoms and prefers not to take prednisone. He wants to try the abx change for now.   Advised to seek prompt follow up telemedicine visit or in person care if worsening, new symptoms arise, or if is not improving with treatment. Follow Up Instructions:  I did not refer this patient for an OV in the next 24 hours for this/these issue(s).  I discussed the assessment and treatment plan with the patient. The patient was provided an opportunity to ask questions and all were answered. The patient agreed with the plan and demonstrated an understanding of the instructions.    I provided 18  minutes of non-face-to-face time during this encounter.   Lucretia Kern, DO

## 2019-10-16 ENCOUNTER — Encounter: Payer: Self-pay | Admitting: Family Medicine

## 2019-10-17 ENCOUNTER — Other Ambulatory Visit: Payer: Self-pay | Admitting: Internal Medicine

## 2019-10-17 MED ORDER — PREDNISONE 10 MG PO TABS: ORAL_TABLET | ORAL | 0 refills | Status: DC

## 2019-10-17 MED ORDER — ALBUTEROL SULFATE HFA 108 (90 BASE) MCG/ACT IN AERS: 2.0000 | INHALATION_SPRAY | Freq: Four times a day (QID) | RESPIRATORY_TRACT | 1 refills | Status: DC | PRN

## 2019-10-18 ENCOUNTER — Encounter: Payer: Self-pay | Admitting: Internal Medicine

## 2019-10-25 ENCOUNTER — Encounter: Payer: Self-pay | Admitting: Internal Medicine

## 2019-10-25 ENCOUNTER — Other Ambulatory Visit: Payer: Self-pay

## 2019-10-25 ENCOUNTER — Ambulatory Visit (INDEPENDENT_AMBULATORY_CARE_PROVIDER_SITE_OTHER): Payer: Medicare HMO | Admitting: Internal Medicine

## 2019-10-25 ENCOUNTER — Ambulatory Visit (HOSPITAL_BASED_OUTPATIENT_CLINIC_OR_DEPARTMENT_OTHER)
Admission: RE | Admit: 2019-10-25 | Discharge: 2019-10-25 | Disposition: A | Discharge status: Home / Self Care | Payer: Medicare HMO | Source: Ambulatory Visit | Attending: Internal Medicine | Admitting: Internal Medicine

## 2019-10-25 VITALS — BP 139/83 | HR 62 | Temp 98.2°F | Resp 18 | Ht 69.0 in | Wt 227.5 lb

## 2019-10-25 DIAGNOSIS — J309 Allergic rhinitis, unspecified: Secondary | ICD-10-CM | POA: Diagnosis not present

## 2019-10-25 DIAGNOSIS — R05 Cough: Secondary | ICD-10-CM | POA: Insufficient documentation

## 2019-10-25 DIAGNOSIS — R059 Cough, unspecified: Secondary | ICD-10-CM

## 2019-10-25 MED ORDER — AZELASTINE HCL 0.1 % NA SOLN: 2.0000 | Freq: Every evening | NASAL | 3 refills | Status: DC | PRN

## 2019-10-25 NOTE — Patient Instructions (Addendum)
Flonase 2 sprays on each side of the nose daily.  This is a over-the-counter medication  Astelin: 2 sprays on each side of the nose at nighttime.  Prescription sent  Claritin 10 mg over-the-counter: 1 tablet at bedtime  Okay to take Mucinex as needed  If you have wheezing or chest congestion: Use albuterol as needed  Proceed with a chest x-ray (first floor)  Call if not gradually better in the next 10 days

## 2019-10-25 NOTE — Progress Notes (Signed)
Pre visit review using our clinic review tool, if applicable. No additional management support is needed unless otherwise documented below in the visit note. 

## 2019-10-25 NOTE — Progress Notes (Signed)
Subjective:    Patient ID: Jeffery Cline, male    DOB: 03/17/1945, 74 y.o.   MRN: [id number]  DOS:  10/25/2019 Type of visit - description: Acute, chart reviewed 09/17/2019: Had a E visit, was prescribed amoxicillin 10/12/2019: Had a televisit, was prescribed doxycycline for sinus congestion. Subsequently, he sent a message, I sent prednisone on 9/6/202  The patient continue with symptoms: Sinus congestion, postnasal dripping, throat irritation. He has a dry cough, he is certain cough is coming from postnasal dripping.  He recently tested negative for Covid. Denies fever chills Admits to sneezing No itchy eyes or itchy nose. Currently not taking Mucinex, Flonase or any antihistaminics.   Wt Readings from Last 3 Encounters:  10/25/19 227 lb 8 oz (103.2 kg)  08/07/19 239 lb 4 oz (108.5 kg)  01/23/19 240 lb 12.8 oz (109.2 kg)    Review of Systems See above   Past Medical History:  Diagnosis Date  . Allergic rhinitis   . Allergy   . Basal cell carcinoma 2016   Right medial posterior shoulder, shave and ED&C  . Cataract    forming   . Decreased carotid pulse    decreased L carotid pulse (?) : Nl carotidu/s 02-2006    . ED (erectile dysfunction)   . GERD (gastroesophageal reflux disease)   . Glaucoma   . Hyperlipidemia   . Hypertension   . Sleep apnea    on CPAP    Past Surgical History:  Procedure Laterality Date  . COLONOSCOPY    . CYSTECTOMY     back, recurrent, last 01-2016  . INGUINAL HERNIA REPAIR     right by dr. Ninfa Linden  . MOHS SURGERY Left 11/2018   face, betwen nose-ear   . PILONIDAL CYST EXCISION    . POLYPECTOMY    . TONSILLECTOMY      Allergies as of 10/25/2019   No Known Allergies     Medication List       Accurate as of October 25, 2019 11:59 PM. If you have any questions, ask your nurse or doctor.        STOP taking these medications   doxycycline 100 MG tablet Commonly known as: VIBRA-TABS Stopped by: Kathlene November, MD    predniSONE 10 MG tablet Commonly known as: DELTASONE Stopped by: Kathlene November, MD     TAKE these medications   albuterol 108 (90 Base) MCG/ACT inhaler Commonly known as: Ventolin HFA Inhale 2 puffs into the lungs every 6 (six) hours as needed for wheezing or shortness of breath.   amLODipine 5 MG tablet Commonly known as: NORVASC Take 1 tablet (5 mg total) by mouth daily.   aspirin 81 MG tablet Take 81 mg by mouth daily.   azelastine 0.1 % nasal spray Commonly known as: ASTELIN Place 2 sprays into both nostrils at bedtime as needed for rhinitis. Started by: Kathlene November, MD   Combigan 0.2-0.5 % ophthalmic solution Generic drug: brimonidine-timolol INSTILL 1 DROP IN BOTH EYES TWICE A DAY   fenofibrate 145 MG tablet Commonly known as: TRICOR Take 1 tablet (145 mg total) by mouth daily.   metFORMIN 1000 MG tablet Commonly known as: GLUCOPHAGE Take 1/2 tablet by mouth twice daily for 2 weeks, then increase to 1 tablet twice daily.   metoprolol tartrate 50 MG tablet Commonly known as: LOPRESSOR Take 1 tablet (50 mg total) by mouth 2 (two) times daily.   telmisartan 80 MG tablet Commonly known as: MICARDIS Take 1 tablet (80 mg  total) by mouth daily.          Objective:   Physical Exam BP 139/83 (BP Location: Left Arm, Patient Position: Sitting, Cuff Size: Normal)   Pulse 62   Temp 98.2 F (36.8 C) (Oral)   Resp 18   Ht 5\' 9"  (1.753 m)   Wt 227 lb 8 oz (103.2 kg)   SpO2 96%   BMI 33.60 kg/m   General:   Well developed, NAD, BMI noted. HEENT:  Normocephalic . Face symmetric, atraumatic TMs: Obscured by moderate amount of dry wax Throat: Symmetric, not red Nose: Slightly congested, sinuses no TTP Lungs:  CTA B (slightly increased expiratory time?)  No rhonchi. Normal respiratory effort, no intercostal retractions, no accessory muscle use. Heart: RRR,  no murmur.  Lower extremities: no pretibial edema bilaterally  Skin: Not pale. Not jaundice Neurologic:  alert  & oriented X3.  Speech normal, gait appropriate for age and unassisted Psych--  Cognition and judgment appear intact.  Cooperative with normal attention span and concentration.  Behavior appropriate. No anxious or depressed appearing.      Assessment      Assessment DM  A1c 6.5 (05-2016)  Morbid obesity HTN Hyperlipidemia Low testosterone: Saw endocrinology 06/2014 , mild hypogonadotropic hypogonadism. no rx recommended  Decreased libido , ED: not interested in Rx Low iron 2016: rechecked >> normal  OSA -- on CPAP , rx Dr Elsworth Soho GERD ( dx based on response to cough to PPIs) Decreased L carotid pulse? Normal carotid ultrasound 2008 [place] 2016- sees derm q year Dr Ronnald Ramp as off 08/2018 Glaucoma    BPH    PLAN: Allergic sinusitis: Symptoms likely due to allergic sinusitis on clinical grounds.  He already have antibiotics twice. Currently not taking any medication consistently. Plan: Flonase, Astelin, Claritin. Okay Mucinex albuterol if needed, see AVS. For completeness we will get a chest x-ray. Call if not gradually better. DM: He self held Metformin thinking that that could cause above symptoms, I doubt it.  Recommend to go back on Metformin.  He is doing great with diet, has lost some weight.   This visit occurred during the SARS-CoV-2 public health emergency.  Safety protocols were in place, including screening questions prior to the visit, additional usage of staff PPE, and extensive cleaning of exam room while observing appropriate contact time as indicated for disinfecting solutions.

## 2019-10-26 NOTE — Assessment & Plan Note (Signed)
Allergic sinusitis: Symptoms likely due to allergic sinusitis on clinical grounds.  He already have antibiotics twice. Currently not taking any medication consistently. Plan: Flonase, Astelin, Claritin. Okay Mucinex albuterol if needed, see AVS. For completeness we will get a chest x-ray. Call if not gradually better. DM: He self held Metformin thinking that that could cause above symptoms, I doubt it.  Recommend to go back on Metformin.  He is doing great with diet, has lost some weight.

## 2019-11-10 ENCOUNTER — Ambulatory Visit: Payer: Medicare HMO | Admitting: Internal Medicine

## 2019-11-23 ENCOUNTER — Other Ambulatory Visit: Payer: Self-pay | Admitting: Internal Medicine

## 2019-12-07 ENCOUNTER — Encounter: Payer: Self-pay | Admitting: Internal Medicine

## 2019-12-09 ENCOUNTER — Other Ambulatory Visit: Payer: Self-pay | Admitting: Internal Medicine

## 2019-12-14 DIAGNOSIS — C4492 Squamous cell carcinoma of skin, unspecified: Secondary | ICD-10-CM | POA: Insufficient documentation

## 2019-12-19 ENCOUNTER — Other Ambulatory Visit (HOSPITAL_BASED_OUTPATIENT_CLINIC_OR_DEPARTMENT_OTHER): Payer: Self-pay | Admitting: Internal Medicine

## 2019-12-19 ENCOUNTER — Ambulatory Visit: Payer: Self-pay

## 2019-12-19 ENCOUNTER — Ambulatory Visit: Payer: Medicare HMO | Attending: Internal Medicine

## 2019-12-19 DIAGNOSIS — Z23 Encounter for immunization: Secondary | ICD-10-CM

## 2019-12-22 MED FILL — MODERNA COVID-19 VACCINE 10: 100 | 1 days supply | Qty: 0 | Fill #0

## 2020-01-02 ENCOUNTER — Other Ambulatory Visit: Payer: Self-pay | Admitting: Internal Medicine

## 2020-01-18 ENCOUNTER — Other Ambulatory Visit: Payer: Self-pay | Admitting: Internal Medicine

## 2020-01-22 ENCOUNTER — Ambulatory Visit (HOSPITAL_BASED_OUTPATIENT_CLINIC_OR_DEPARTMENT_OTHER)
Admission: RE | Admit: 2020-01-22 | Discharge: 2020-01-22 | Disposition: A | Discharge status: Home / Self Care | Payer: Medicare HMO | Source: Ambulatory Visit | Attending: Pulmonary Disease | Admitting: Pulmonary Disease

## 2020-01-22 ENCOUNTER — Other Ambulatory Visit: Payer: Self-pay

## 2020-01-22 DIAGNOSIS — G4733 Obstructive sleep apnea (adult) (pediatric): Secondary | ICD-10-CM | POA: Diagnosis present

## 2020-01-22 DIAGNOSIS — R911 Solitary pulmonary nodule: Secondary | ICD-10-CM

## 2020-01-31 ENCOUNTER — Ambulatory Visit (INDEPENDENT_AMBULATORY_CARE_PROVIDER_SITE_OTHER): Payer: Medicare HMO | Admitting: Adult Health

## 2020-01-31 ENCOUNTER — Other Ambulatory Visit: Payer: Self-pay

## 2020-01-31 ENCOUNTER — Encounter: Payer: Self-pay | Admitting: Adult Health

## 2020-01-31 DIAGNOSIS — J479 Bronchiectasis, uncomplicated: Secondary | ICD-10-CM | POA: Diagnosis not present

## 2020-01-31 DIAGNOSIS — R911 Solitary pulmonary nodule: Secondary | ICD-10-CM

## 2020-01-31 DIAGNOSIS — G4733 Obstructive sleep apnea (adult) (pediatric): Secondary | ICD-10-CM | POA: Diagnosis not present

## 2020-01-31 NOTE — Assessment & Plan Note (Signed)
Patient is doing well with weight loss and exercise.  Continue current healthy plan

## 2020-01-31 NOTE — Assessment & Plan Note (Signed)
Mild bronchiectasis with some tree-in-bud nodularity in the right upper lobe.  Patient has some chronic sinus drainage intermittent reflux.  Has no active symptoms and is doing very well.  We will continue to follow.  Increased pulmonary hygiene and control for triggers for now.  Plan  Patient Instructions  Continue on CPAP At bedtime  .  Work on healthy weight .  Do not drive if sleepy .  GERD diet  Protonix daily as needed for reflux .  Continue on Claritin , Astelin and Flonase daily As needed   Liquid Mucinex DM As needed  Cough/congestion .  Follow up with Dr. Elsworth Soho  In 6 months and As needed

## 2020-01-31 NOTE — Patient Instructions (Addendum)
Continue on CPAP At bedtime  .  Work on healthy weight .  Do not drive if sleepy .  GERD diet  Protonix daily as needed for reflux .  Continue on Claritin , Astelin and Flonase daily As needed   Liquid Mucinex DM As needed  Cough/congestion .  Follow up with Dr. Elsworth Soho  In 6 months and As needed

## 2020-01-31 NOTE — Assessment & Plan Note (Signed)
Excellent control and compliance on nocturnal CPAP.  Patient education given.  Plan  Patient Instructions  Continue on CPAP At bedtime  .  Work on healthy weight .  Do not drive if sleepy .  GERD diet  Protonix daily as needed for reflux .  Continue on Claritin , Astelin and Flonase daily As needed   Liquid Mucinex DM As needed  Cough/congestion .  Follow up with Dr. Elsworth Soho  In 6 months and As needed

## 2020-01-31 NOTE — Assessment & Plan Note (Signed)
Stable scattered pulmonary nodules measuring up to 5 mm no changes.  Most consistent with benign etiology .   Plan  Patient Instructions  Continue on CPAP At bedtime  .  Work on healthy weight .  Do not drive if sleepy .  GERD diet  Protonix daily as needed for reflux .  Continue on Claritin , Astelin and Flonase daily As needed   Liquid Mucinex DM As needed  Cough/congestion .  Follow up with Dr. Elsworth Soho  In 6 months and As needed

## 2020-01-31 NOTE — Progress Notes (Signed)
@Patient  ID: Jeffery Cline, male    DOB: 11-Jan-1946, 74 y.o.   MRN: [id number]  Chief Complaint  Patient presents with  . Follow-up  . Sleep Apnea    Referring provider: Colon Branch, MD  HPI: 74 year old male never smoker followed for obstructive sleep apnea and lung nodules  TEST/EVENTS :  HST6/2016 showed moderate OSA with AHI 27/hour and lowest desaturation of 79%. Total sleep time was 8 hours.  CT chest January 11, 2019 clustered tree-in-bud nodularity right upper lobe, scattered tiny bilateral pulmonary nodules measuring up to 5 mm.  01/31/2020 Follow up : OSA and Lung nodules  Patient returns for a follow-up visit.  Patient has known underlying obstructive sleep apnea.  Patient says he is doing very well on CPAP.  He feels well and rested.  Denies any significant daytime sleepiness and feels that he benefits from CPAP.  Patient says he wears his CPAP every night never misses any nights.  CPAP download shows excellent compliance with 100% usage.  Daily average is around 9 hours.  Patient is on CPAP 8 cm H2O.  AHI 1.4.  Remains very active walks 2 miles each day. Has lost 20 lbs over last 6 months .   Patient has been found to have lung nodules on CT chest.  CT chest January 11, 2019 showed tree-in-bud nodularity right upper lobe and scattered pulmonary nodules measuring up to 5 mm.  Follow-up CT chest done January 22, 2020 showed tree-in-bud opacities in the right upper lobe mildly increased with associated bronchiectasis.  Stable scattered pulmonary nodules measuring up to 5 mm consistent with benign etiology Has some GERD on occasion. Takes protonix As needed  . Considers it to be minimal .  Has chronic sinus drainage, uses claritin and flonase. . Controlled . Has some throat clearing and dry cough on occasion.  Says he feels very well.   We discussed incidental findings on CT chest of hepatic steatosis and atherosclerosis and TAA which were noted on previous scan.   Patient is aware to discuss this with his primary care on his upcoming a visit.Marland Kitchen  No Known Allergies  Immunization History  Administered Date(s) Administered  . Influenza Split 11/02/2013  . Influenza Whole 11/23/2007, 10/09/2009, 11/09/2012  . Influenza, High Dose Seasonal PF 11/18/2016, 09/26/2018, 11/22/2019  . Influenza,inj,Quad PF,6+ Mos 12/06/2014  . Influenza-Unspecified 11/15/2015, 11/26/2017  . Moderna SARS-COV2 Booster Vaccination 12/19/2019  . Moderna Sars-Covid-2 Vaccination 03/06/2019, 04/03/2019  . Pneumococcal Conjugate-13 04/02/2014  . Pneumococcal Polysaccharide-23 01/20/2011  . Td 06/07/1998, 09/18/2008  . Tdap 02/15/2019  . Zoster 02/21/2008  . Zoster Recombinat (Shingrix) 02/15/2019    Past Medical History:  Diagnosis Date  . Allergic rhinitis   . Allergy   . Basal cell carcinoma 2016   Right medial posterior shoulder, shave and ED&C  . Cataract    forming   . Decreased carotid pulse    decreased L carotid pulse (?) : Nl carotidu/s 02-2006    . ED (erectile dysfunction)   . GERD (gastroesophageal reflux disease)   . Glaucoma   . Hyperlipidemia   . Hypertension   . SCC (squamous cell carcinoma)   . Sleep apnea    on CPAP    Tobacco History: Social History   Tobacco Use  Smoking Status Never Smoker  Smokeless Tobacco Never Used   Counseling given: Not Answered   Outpatient Medications Prior to Visit  Medication Sig Dispense Refill  . albuterol (VENTOLIN HFA) 108 (90 Base) MCG/ACT inhaler Inhale  2 puffs into the lungs every 6 (six) hours as needed for wheezing or shortness of breath. 1 each 1  . amLODipine (NORVASC) 5 MG tablet Take 1 tablet (5 mg total) by mouth daily. 90 tablet 1  . aspirin 81 MG tablet Take 81 mg by mouth daily.    . COMBIGAN 0.2-0.5 % ophthalmic solution INSTILL 1 DROP IN BOTH EYES TWICE A DAY  3  . fenofibrate (TRICOR) 145 MG tablet Take 1 tablet (145 mg total) by mouth daily. 90 tablet 1  . metFORMIN (GLUCOPHAGE) 1000 MG  tablet Take 1 tablet (1,000 mg total) by mouth 2 (two) times daily with a meal. 180 tablet 0  . metoprolol tartrate (LOPRESSOR) 50 MG tablet Take 1 tablet (50 mg total) by mouth 2 (two) times daily. 180 tablet 1  . telmisartan (MICARDIS) 80 MG tablet Take 1 tablet (80 mg total) by mouth daily. 90 tablet 1  . azelastine (ASTELIN) 0.1 % nasal spray Place 2 sprays into both nostrils at bedtime as needed for rhinitis or allergies. (Patient not taking: Reported on 01/31/2020) 30 mL 12   No facility-administered medications prior to visit.     Review of Systems:   Constitutional:   No  weight loss, night sweats,  Fevers, chills, fatigue, or  lassitude.  HEENT:   No headaches,  Difficulty swallowing,  Tooth/dental problems, or  Sore throat,                No sneezing, itching, ear ache,  +nasal congestion, post nasal drip,   CV:  No chest pain,  Orthopnea, PND, swelling in lower extremities, anasarca, dizziness, palpitations, syncope.   GI  No heartburn, indigestion, abdominal pain, nausea, vomiting, diarrhea, change in bowel habits, loss of appetite, bloody stools.   Resp: No shortness of breath with exertion or at rest.  No excess mucus, no productive cough,  No non-productive cough,  No coughing up of blood.  No change in color of mucus.  No wheezing.  No chest wall deformity  Skin: no rash or lesions.  GU: no dysuria, change in color of urine, no urgency or frequency.  No flank pain, no hematuria   MS:  No joint pain or swelling.  No decreased range of motion.  No back pain.    Physical Exam  BP 130/70 (BP Location: Left Arm, Patient Position: Sitting, Cuff Size: Normal)   Pulse 66   Temp (!) 97.2 F (36.2 C) (Temporal)   Ht 5\' 9"  (1.753 m)   Wt 228 lb 6.4 oz (103.6 kg)   SpO2 97%   BMI 33.73 kg/m   GEN: A/Ox3; pleasant , NAD, well nourished    HEENT:  [place]/AT, , NOSE-clear, THROAT-clear, no lesions, no postnasal drip or exudate noted. Class 2-3 MP airway  NECK:  Supple w/  fair ROM; no JVD; normal carotid impulses w/o bruits; no thyromegaly or nodules palpated; no lymphadenopathy.    RESP  Clear  P & A; w/o, wheezes/ rales/ or rhonchi. no accessory muscle use, no dullness to percussion  CARD:  RRR, no m/r/g, no peripheral edema, pulses intact, no cyanosis or clubbing.  GI:   Soft & nt; nml bowel sounds; no organomegaly or masses detected.   Musco: Warm bil, no deformities or joint swelling noted.   Neuro: alert, no focal deficits noted.    Skin: Warm, no lesions or rashes    Lab Results:   BNP No results found for: BNP  ProBNP No results found for: PROBNP  Imaging: CT Chest Wo Contrast  Result Date: 01/22/2020 CLINICAL DATA:  Follow-up pulmonary nodules. EXAM: CT CHEST WITHOUT CONTRAST TECHNIQUE: Multidetector CT imaging of the chest was performed following the standard protocol without IV contrast. COMPARISON:  01/11/2019 chest CT. FINDINGS: Cardiovascular: Normal heart size. No significant pericardial effusion/thickening. Three-vessel coronary atherosclerosis. Atherosclerotic thoracic aorta with ectatic 4.3 cm ascending thoracic aorta. Normal caliber pulmonary arteries. Mediastinum/Nodes: No discrete thyroid nodules. Unremarkable esophagus. No pathologically enlarged axillary, mediastinal or hilar lymph nodes, noting limited sensitivity for the detection of hilar adenopathy on this noncontrast study. Lungs/Pleura: No pneumothorax. No pleural effusion. Mild patchy tree-in-bud opacities in the peripheral right upper lobe are mildly increased (series 3/image 58) and are associated with mild cylindrical bronchiectasis. A few scattered previously visualized small solid pulmonary nodules in both lungs measuring up to 5 mm in the left upper lobe (series 3/image 65) are all stable and considered benign. No acute consolidative airspace disease or new significant pulmonary nodules. Upper abdomen: Mild diffuse hepatic steatosis. Musculoskeletal: No aggressive  appearing focal osseous lesions. Moderate thoracic spondylosis. IMPRESSION: 1. Mild patchy tree-in-bud opacities in the peripheral right upper lobe associated with mild cylindrical bronchiectasis, mildly increased since 01/11/2019 chest CT. Findings are suggestive of a mild chronic bronchiolitis such as due to atypical mycobacterial infection (MAI). 2. Scattered small solid pulmonary nodules are otherwise all stable and considered benign. 3. Three-vessel coronary atherosclerosis. 4. Mild diffuse hepatic steatosis. 5. Ectatic 4.3 cm ascending thoracic aorta, unchanged. Recommend annual imaging followup by CTA or MRA. This recommendation follows 2010 ACCF/AHA/AATS/ACR/ASA/SCA/SCAI/SIR/STS/SVM Guidelines for the Diagnosis and Management of Patients with Thoracic Aortic Disease. Circulation. 2010; [id number]. Aortic aneurysm NOS (ICD10-I71.9). 6. Aortic Atherosclerosis (ICD10-I70.0). Electronically Signed   By: Ilona Sorrel M.D.   On: 01/22/2020 09:46      No flowsheet data found.  No results found for: NITRICOXIDE      Assessment & Plan:   OSA (obstructive sleep apnea) Excellent control and compliance on nocturnal CPAP.  Patient education given.  Plan  Patient Instructions  Continue on CPAP At bedtime  .  Work on healthy weight .  Do not drive if sleepy .  GERD diet  Protonix daily as needed for reflux .  Continue on Claritin , Astelin and Flonase daily As needed   Liquid Mucinex DM As needed  Cough/congestion .  Follow up with Dr. Elsworth Soho  In 6 months and As needed       Morbid obesity [facility]) Patient is doing well with weight loss and exercise.  Continue current healthy plan  Pulmonary nodule seen on imaging study Stable scattered pulmonary nodules measuring up to 5 mm no changes.  Most consistent with benign etiology .   Plan  Patient Instructions  Continue on CPAP At bedtime  .  Work on healthy weight .  Do not drive if sleepy .  GERD diet  Protonix daily as needed for  reflux .  Continue on Claritin , Astelin and Flonase daily As needed   Liquid Mucinex DM As needed  Cough/congestion .  Follow up with Dr. Elsworth Soho  In 6 months and As needed       Bronchiectasis without complication ([place]) Mild bronchiectasis with some tree-in-bud nodularity in the right upper lobe.  Patient has some chronic sinus drainage intermittent reflux.  Has no active symptoms and is doing very well.  We will continue to follow.  Increased pulmonary hygiene and control for triggers for now.  Plan  Patient Instructions  Continue on CPAP  At bedtime  .  Work on healthy weight .  Do not drive if sleepy .  GERD diet  Protonix daily as needed for reflux .  Continue on Claritin , Astelin and Flonase daily As needed   Liquid Mucinex DM As needed  Cough/congestion .  Follow up with Dr. Elsworth Soho  In 6 months and As needed          Rexene Edison, NP 01/31/2020

## 2020-02-07 ENCOUNTER — Encounter: Payer: Self-pay | Admitting: Internal Medicine

## 2020-02-07 ENCOUNTER — Telehealth: Payer: Self-pay | Admitting: Internal Medicine

## 2020-02-07 ENCOUNTER — Other Ambulatory Visit: Payer: Self-pay

## 2020-02-07 ENCOUNTER — Ambulatory Visit (INDEPENDENT_AMBULATORY_CARE_PROVIDER_SITE_OTHER): Payer: Medicare HMO | Admitting: Internal Medicine

## 2020-02-07 VITALS — BP 127/79 | HR 49 | Temp 98.0°F | Resp 18 | Ht 69.0 in | Wt 225.2 lb

## 2020-02-07 DIAGNOSIS — I10 Essential (primary) hypertension: Secondary | ICD-10-CM | POA: Diagnosis not present

## 2020-02-07 DIAGNOSIS — J309 Allergic rhinitis, unspecified: Secondary | ICD-10-CM | POA: Diagnosis not present

## 2020-02-07 DIAGNOSIS — Z0000 Encounter for general adult medical examination without abnormal findings: Secondary | ICD-10-CM

## 2020-02-07 DIAGNOSIS — R9389 Abnormal findings on diagnostic imaging of other specified body structures: Secondary | ICD-10-CM

## 2020-02-07 DIAGNOSIS — E119 Type 2 diabetes mellitus without complications: Secondary | ICD-10-CM | POA: Diagnosis not present

## 2020-02-07 DIAGNOSIS — Z125 Encounter for screening for malignant neoplasm of prostate: Secondary | ICD-10-CM | POA: Diagnosis not present

## 2020-02-07 DIAGNOSIS — E785 Hyperlipidemia, unspecified: Secondary | ICD-10-CM | POA: Diagnosis not present

## 2020-02-07 LAB — COMPREHENSIVE METABOLIC PANEL
ALT: 23 U/L (ref 0–53)
AST: 17 U/L (ref 0–37)
Albumin: 4.6 g/dL (ref 3.5–5.2)
Alkaline Phosphatase: 36 U/L — ABNORMAL LOW (ref 39–117)
BUN: 16 mg/dL (ref 6–23)
CO2: 28 mEq/L (ref 19–32)
Calcium: 9.3 mg/dL (ref 8.4–10.5)
Chloride: 102 mEq/L (ref 96–112)
Creatinine, Ser: 0.88 mg/dL (ref 0.40–1.50)
GFR: 84.61 mL/min (ref >60.00)
Glucose, Bld: 93 mg/dL (ref 70–99)
Potassium: 4 mEq/L (ref 3.5–5.1)
Sodium: 137 mEq/L (ref 135–145)
Total Bilirubin: 0.7 mg/dL (ref 0.2–1.2)
Total Protein: 6.8 g/dL (ref 6.0–8.3)

## 2020-02-07 LAB — CBC WITH DIFFERENTIAL/PLATELET
Basophils Absolute: 0.1 10*3/uL (ref 0.0–0.1)
Basophils Relative: 1 % (ref 0.0–3.0)
Eosinophils Absolute: 0.7 10*3/uL (ref 0.0–0.7)
Eosinophils Relative: 8.1 % — ABNORMAL HIGH (ref 0.0–5.0)
HCT: 42.9 % (ref 39.0–52.0)
Hemoglobin: 14.3 g/dL (ref 13.0–17.0)
Lymphocytes Relative: 29.1 % (ref 12.0–46.0)
Lymphs Abs: 2.6 10*3/uL (ref 0.7–4.0)
MCHC: 33.4 g/dL (ref 30.0–36.0)
MCV: 90.6 fl (ref 78.0–100.0)
Monocytes Absolute: 0.9 10*3/uL (ref 0.1–1.0)
Monocytes Relative: 10.2 % (ref 3.0–12.0)
Neutro Abs: 4.6 10*3/uL (ref 1.4–7.7)
Neutrophils Relative %: 51.6 % (ref 43.0–77.0)
Platelets: 180 10*3/uL (ref 150.0–400.0)
RBC: 4.74 Mil/uL (ref 4.22–5.81)
RDW: 13.7 % (ref 11.5–15.5)
WBC: 8.9 10*3/uL (ref 4.0–10.5)

## 2020-02-07 LAB — TSH: TSH: 1.71 u[IU]/mL (ref 0.35–4.50)

## 2020-02-07 LAB — LIPID PANEL
Cholesterol: 197 mg/dL (ref 0–200)
HDL: 30.5 mg/dL — ABNORMAL LOW (ref >39.00)
LDL Cholesterol: 130 mg/dL — ABNORMAL HIGH (ref 0–99)
NonHDL: 166.1
Total CHOL/HDL Ratio: 6
Triglycerides: 181 mg/dL — ABNORMAL HIGH (ref 0.0–149.0)
VLDL: 36.2 mg/dL (ref 0.0–40.0)

## 2020-02-07 LAB — HEMOGLOBIN A1C: Hgb A1c MFr Bld: 5.9 % (ref 4.6–6.5)

## 2020-02-07 LAB — PSA: PSA: 1.62 ng/mL (ref 0.10–4.00)

## 2020-02-07 NOTE — Patient Instructions (Addendum)
Per our records you are due for an eye exam. Please contact your eye doctor to schedule an appointment. Please have them send copies of your office visit notes to us. Our fax number is [phone].    GO TO THE LAB : Get the blood work     GO TO THE FRONT DESK, PLEASE SCHEDULE YOUR APPOINTMENTS Come back for a checkup in 4 months 

## 2020-02-07 NOTE — Assessment & Plan Note (Signed)
Here for CPX HTN: Well-controlled in the ambulatory setting, continue metoprolol, amlodipine, Micardis.  Checking labs. DM: Started Metformin based on the last A1c of 8.2 back in June 2021.  Doing better with lifestyle, walking daily, watching his diet, has lost few pounds.  Checking A1c Hyperlipidemia: On fenofibrate, checking labs. Skin cancer: Sees dermatology yearly. Allergic sinusitis: See last visit, sxs improved. Aspirin: No known CAD or strokes however he has multiple CV RFs and coronary atherosclerosis on CT.  He has no symptoms.  Rec  to continue aspirin 81 CT chest w/  multiple findings, see report: Bronchiectasis.  Scattered solid pulmonary nodules stable.  Three-vessel CAD.  Hepatic steatosis.  Ectatic ascending thoracic aorta unchanged.  Recommend to discuss with pulmonary, as far as the CAD, he is asx, and is CV RF control. RTC 4 months

## 2020-02-07 NOTE — Telephone Encounter (Signed)
Refill request for fenofibrate- lipid panel today, okay to refill?

## 2020-02-07 NOTE — Telephone Encounter (Signed)
Appt later today.

## 2020-02-07 NOTE — Assessment & Plan Note (Signed)
-  Td 2021 - pnm 23 2012; Prevnar 2016 - Zostavax 02-2008 -shingrex  X 2 per pt  -s/p COVID vaccine x2 and a booster  - had a flu shot   --CCS Colonoscopy:  01/09/2002, normal Colonoscopy 04/2011, 2 polyps,  Cscope 02-2017, 5 years  --Prostate cancer screening:  DRE today wnl, check a PSA --diet-exercise discussed   --Labs: CMP, FLP, CBC, A1c, TSH, PSA

## 2020-02-07 NOTE — Progress Notes (Signed)
Pre visit review using our clinic review tool, if applicable. No additional management support is needed unless otherwise documented below in the visit note. 

## 2020-02-07 NOTE — Progress Notes (Signed)
Subjective:    Patient ID: Jeffery Cline, male    DOB: [birth year], 74 y.o.   MRN: [id number]  DOS:  02/07/2020 Type of visit - description: Here for CPX  In addition to CPX multiple other issues discussed.  Wt Readings from Last 3 Encounters:  02/07/20 225 lb 4 oz (102.2 kg)  01/31/20 228 lb 6.4 oz (103.6 kg)  10/25/19 227 lb 8 oz (103.2 kg)     Review of Systems  A 14 point review of systems is negative    Past Medical History:  Diagnosis Date  . Allergic rhinitis   . Allergy   . Basal cell carcinoma 2016   Right medial posterior shoulder, shave and ED&C  . Cataract    forming   . Decreased carotid pulse    decreased L carotid pulse (?) : Nl carotidu/s 02-2006    . ED (erectile dysfunction)   . GERD (gastroesophageal reflux disease)   . Glaucoma   . Hyperlipidemia   . Hypertension   . SCC (squamous cell carcinoma)   . Sleep apnea    on CPAP    Past Surgical History:  Procedure Laterality Date  . CYSTECTOMY     back, recurrent, last 01-2016  . INGUINAL HERNIA REPAIR     right by dr. Magnus Ivan  . MOHS SURGERY Left 11/2018   face, betwen nose-ear   . PILONIDAL CYST EXCISION    . POLYPECTOMY    . TONSILLECTOMY      Allergies as of 02/07/2020   No Known Allergies     Medication List       Accurate as of February 07, 2020  8:40 PM. If you have any questions, ask your nurse or doctor.        albuterol 108 (90 Base) MCG/ACT inhaler Commonly known as: Ventolin HFA Inhale 2 puffs into the lungs every 6 (six) hours as needed for wheezing or shortness of breath.   amLODipine 5 MG tablet Commonly known as: NORVASC Take 1 tablet (5 mg total) by mouth daily.   aspirin EC 81 MG tablet Take 81 mg by mouth daily. Swallow whole. What changed: Another medication with the same name was removed. Continue taking this medication, and follow the directions you see here. Changed by: Willow Ora, MD   azelastine 0.1 % nasal spray Commonly known as: ASTELIN Place  1-2 sprays into both nostrils 2 (two) times daily. Use in each nostril as directed   Combigan 0.2-0.5 % ophthalmic solution Generic drug: brimonidine-timolol INSTILL 1 DROP IN BOTH EYES TWICE A DAY   fenofibrate 145 MG tablet Commonly known as: TRICOR Take 1 tablet (145 mg total) by mouth daily.   metFORMIN 1000 MG tablet Commonly known as: GLUCOPHAGE Take 1 tablet (1,000 mg total) by mouth 2 (two) times daily with a meal.   metoprolol tartrate 50 MG tablet Commonly known as: LOPRESSOR Take 1 tablet (50 mg total) by mouth 2 (two) times daily.   telmisartan 80 MG tablet Commonly known as: MICARDIS Take 1 tablet (80 mg total) by mouth daily.          Objective:   Physical Exam BP 127/79 (BP Location: Left Arm, Patient Position: Sitting, Cuff Size: Normal)   Pulse (!) 49   Temp 98 F (36.7 C) (Oral)   Resp 18   Ht 5\' 9"  (1.753 m)   Wt 225 lb 4 oz (102.2 kg)   SpO2 96%   BMI 33.26 kg/m   General: Well developed, NAD,  BMI noted Neck: No  thyromegaly  HEENT:  Normocephalic . Face symmetric, atraumatic Lungs:  CTA B Normal respiratory effort, no intercostal retractions, no accessory muscle use. Heart: RRR,  no murmur.  Abdomen:  Not distended, soft, non-tender. No rebound or rigidity. Lower extremities: no pretibial edema bilaterally  DRE: Normal sphincter tone, no stools, prostate slightly enlarged but not nodular or tender. Skin: Exposed areas without rash. Not pale. Not jaundice Neurologic:  alert & oriented X3.  Speech normal, gait appropriate for age and unassisted Strength symmetric and appropriate for age.  Psych: Cognition and judgment appear intact.  Cooperative with normal attention span and concentration.  Behavior appropriate. No anxious or depressed appearing.     Assessment    Assessment DM  A1c 6.5 (05-2016)  Morbid obesity HTN Hyperlipidemia Low testosterone: Saw endocrinology 06/2014 , mild hypogonadotropic hypogonadism. no rx recommended   Decreased libido , ED: not interested in Rx Low iron 2016: rechecked >> normal  OSA -- on CPAP , rx Dr Elsworth Soho GERD ( dx based on response to cough to PPIs) Decreased L carotid pulse? Normal carotid ultrasound [facility] 2016- sees derm q year Dr Ronnald Ramp as off 08/2018 Glaucoma    BPH Chest 01/2020: Bronchiectasis.  Scattered solid pulmonary nodules stable.  Three-vessel CAD.  Hepatic steatosis.  Ectatic ascending thoracic aorta unchanged  PLAN: Here for CPX HTN: Well-controlled in the ambulatory setting, continue metoprolol, amlodipine, Micardis.  Checking labs. DM: Started Metformin based on the last A1c of 8.2 back in June 2021.  Doing better with lifestyle, walking daily, watching his diet, has lost few pounds.  Checking A1c Hyperlipidemia: On fenofibrate, checking labs. Skin cancer: Sees dermatology yearly. Allergic sinusitis: See last visit, sxs improved. Aspirin: No known CAD or strokes however he has multiple CV RFs and coronary atherosclerosis on CT.  He has no symptoms.  Rec  to continue aspirin 81 CT chest w/  multiple findings, see report: Bronchiectasis.  Scattered solid pulmonary nodules stable.  Three-vessel CAD.  Hepatic steatosis.  Ectatic ascending thoracic aorta unchanged.  Recommend to discuss with pulmonary, as far as the CAD, he is asx, and is CV RF control. RTC 4 months  In addition to CPX, I addressed all his chronic medical problems, we also discussed the pros and cons of aspirin.  I reviewed recent CT chest with multiple findings and addressed most off his appropriate questions.   This visit occurred during the SARS-CoV-2 public health emergency.  Safety protocols were in place, including screening questions prior to the visit, additional usage of staff PPE, and extensive cleaning of exam room while observing appropriate contact time as indicated for disinfecting solutions.

## 2020-02-07 NOTE — Progress Notes (Signed)
Called [facility]- they confirmed Pt only received 1 Shingrix.

## 2020-02-08 MED ORDER — ATORVASTATIN CALCIUM 20 MG PO TABS: 20.0000 mg | ORAL_TABLET | Freq: Every day | ORAL | 2 refills | Status: DC

## 2020-02-08 MED ORDER — FENOFIBRATE 145 MG PO TABS: 145.0000 mg | ORAL_TABLET | Freq: Every day | ORAL | 1 refills | Status: DC

## 2020-02-08 NOTE — Telephone Encounter (Signed)
See labs 

## 2020-02-08 NOTE — Addendum Note (Signed)
Addended by: Conrad Mineola D on: 02/08/2020 10:30 AM   Modules accepted: Orders

## 2020-03-21 ENCOUNTER — Other Ambulatory Visit: Payer: Medicare HMO

## 2020-03-25 ENCOUNTER — Other Ambulatory Visit (INDEPENDENT_AMBULATORY_CARE_PROVIDER_SITE_OTHER): Payer: Medicare HMO

## 2020-03-25 ENCOUNTER — Other Ambulatory Visit: Payer: Self-pay

## 2020-03-25 DIAGNOSIS — E785 Hyperlipidemia, unspecified: Secondary | ICD-10-CM

## 2020-03-25 LAB — LIPID PANEL
Cholesterol: 152 mg/dL (ref 0–200)
HDL: 27.9 mg/dL — ABNORMAL LOW (ref >39.00)
LDL Cholesterol: 86 mg/dL (ref 0–99)
NonHDL: 124.4
Total CHOL/HDL Ratio: 5
Triglycerides: 192 mg/dL — ABNORMAL HIGH (ref 0.0–149.0)
VLDL: 38.4 mg/dL (ref 0.0–40.0)

## 2020-03-25 LAB — ALT: ALT: 24 U/L (ref 0–53)

## 2020-03-25 LAB — AST: AST: 17 U/L (ref 0–37)

## 2020-03-27 ENCOUNTER — Other Ambulatory Visit: Payer: Self-pay | Admitting: Internal Medicine

## 2020-03-28 MED ORDER — ATORVASTATIN CALCIUM 20 MG PO TABS: 20.0000 mg | ORAL_TABLET | Freq: Every day | ORAL | 3 refills | Status: DC

## 2020-03-28 NOTE — Addendum Note (Signed)
Addended byDamita Dunnings D on: 03/28/2020 07:51 AM   Modules accepted: Orders

## 2020-05-13 ENCOUNTER — Other Ambulatory Visit: Payer: Self-pay | Admitting: Internal Medicine

## 2020-05-20 ENCOUNTER — Ambulatory Visit: Payer: Medicare HMO | Attending: Internal Medicine

## 2020-05-20 DIAGNOSIS — Z23 Encounter for immunization: Secondary | ICD-10-CM

## 2020-05-20 NOTE — Progress Notes (Signed)
   Covid-19 Vaccination Clinic  Name:  Jeffery Cline    MRN: [id number] DOB: Jan 08, 1946  05/20/2020  [name] was observed post Covid-19 immunization for 15 minutes without incident. He was provided with Vaccine Information Sheet and instruction to access the V-Safe system.   [name] was instructed to call 911 with any severe reactions post vaccine: Marland Kitchen Difficulty breathing  . Swelling of face and throat  . A fast heartbeat  . A bad rash all over body  . Dizziness and weakness   Immunizations Administered    Name Date Dose VIS Date Route   Moderna Covid-19 Booster Vaccine 05/20/2020  9:38 AM 0.25 mL 11/29/2019 Intramuscular   Manufacturer: Levan Hurst   Lot: [id number]   [place]: [phone]

## 2020-05-22 ENCOUNTER — Encounter: Payer: Self-pay | Admitting: Internal Medicine

## 2020-05-24 ENCOUNTER — Other Ambulatory Visit (HOSPITAL_BASED_OUTPATIENT_CLINIC_OR_DEPARTMENT_OTHER): Payer: Self-pay

## 2020-05-24 MED ORDER — COVID-19 MRNA VACC (MODERNA) 100 MCG/0.5ML IM SUSP
INTRAMUSCULAR | 0 refills | Status: DC
  Filled 2020-05-24: qty 0.25, 1d supply, fill #0

## 2020-06-06 LAB — HM DIABETES EYE EXAM

## 2020-06-07 ENCOUNTER — Other Ambulatory Visit: Payer: Self-pay

## 2020-06-07 ENCOUNTER — Ambulatory Visit (INDEPENDENT_AMBULATORY_CARE_PROVIDER_SITE_OTHER): Payer: Medicare HMO | Admitting: Internal Medicine

## 2020-06-07 VITALS — BP 126/72 | HR 56 | Temp 97.0°F | Ht 69.0 in | Wt 220.0 lb

## 2020-06-07 DIAGNOSIS — I2510 Atherosclerotic heart disease of native coronary artery without angina pectoris: Secondary | ICD-10-CM

## 2020-06-07 DIAGNOSIS — Z7185 Encounter for immunization safety counseling: Secondary | ICD-10-CM

## 2020-06-07 DIAGNOSIS — Z23 Encounter for immunization: Secondary | ICD-10-CM

## 2020-06-07 DIAGNOSIS — E119 Type 2 diabetes mellitus without complications: Secondary | ICD-10-CM | POA: Diagnosis not present

## 2020-06-07 DIAGNOSIS — I10 Essential (primary) hypertension: Secondary | ICD-10-CM | POA: Diagnosis not present

## 2020-06-07 LAB — BASIC METABOLIC PANEL
BUN: 19 mg/dL (ref 6–23)
CO2: 26 mEq/L (ref 19–32)
Calcium: 9.2 mg/dL (ref 8.4–10.5)
Chloride: 104 mEq/L (ref 96–112)
Creatinine, Ser: 0.89 mg/dL (ref 0.40–1.50)
GFR: 84.12 mL/min (ref >60.00)
Glucose, Bld: 95 mg/dL (ref 70–99)
Potassium: 4 mEq/L (ref 3.5–5.1)
Sodium: 138 mEq/L (ref 135–145)

## 2020-06-07 LAB — HEMOGLOBIN A1C: Hgb A1c MFr Bld: 5.9 % (ref 4.6–6.5)

## 2020-06-07 NOTE — Patient Instructions (Addendum)
Continue checking your blood pressure BP GOAL is between 110/65 and  135/85. If it is consistently higher or lower, let me know  Recommend to proceed with your second Shingrix vaccination at your pharmacy  [facility] : Get the blood work     [place], [place] back for a checkup in 6 months

## 2020-06-07 NOTE — Progress Notes (Signed)
Subjective:    Patient ID: Jeffery Cline, male    DOB: 1945-12-03, 75 y.o.   MRN: [id number]  DOS:  06/07/2020 Type of visit - description: Follow-up, to discuss hypertension, high cholesterol and DM.  He feels very well, takes a walk daily. Good compliance with cholesterol medication Aspirin: Often forgets.  Patient counseled. Ambulatory BPs are always normal. GERD: He still takes occasional pantoprazole.   Review of Systems Denies chest pain or difficulty breathing.  No edema No lower extremity paresthesias No cough or sputum production, occasionally has to clear his throat, he thinks related to allergies  Past Medical History:  Diagnosis Date  . Allergic rhinitis   . Allergy   . Basal cell carcinoma 2016   Right medial posterior shoulder, shave and ED&C  . Cataract    forming   . Decreased carotid pulse    decreased L carotid pulse (?) : Nl carotidu/s 02-2006    . ED (erectile dysfunction)   . GERD (gastroesophageal reflux disease)   . Glaucoma   . Hyperlipidemia   . Hypertension   . SCC (squamous cell carcinoma)   . Sleep apnea    on CPAP    Past Surgical History:  Procedure Laterality Date  . CYSTECTOMY     back, recurrent, last 01-2016  . INGUINAL HERNIA REPAIR     right by dr. Ninfa Linden  . MOHS SURGERY Left 11/2018   face, betwen nose-ear   . PILONIDAL CYST EXCISION    . POLYPECTOMY    . TONSILLECTOMY      Allergies as of 06/07/2020   No Known Allergies     Medication List       Accurate as of June 07, 2020 11:59 PM. If you have any questions, ask your nurse or doctor.        STOP taking these medications   Moderna COVID-19 Vaccine 100 MCG/0.5ML injection Generic drug: COVID-19 mRNA vaccine (Moderna) Stopped by: Kathlene November, MD     TAKE these medications   albuterol 108 (90 Base) MCG/ACT inhaler Commonly known as: Ventolin HFA Inhale 2 puffs into the lungs every 6 (six) hours as needed for wheezing or shortness of breath.   amLODipine 5  MG tablet Commonly known as: NORVASC Take 1 tablet (5 mg total) by mouth daily.   aspirin EC 81 MG tablet Take 81 mg by mouth daily. Swallow whole.   atorvastatin 20 MG tablet Commonly known as: LIPITOR Take 1 tablet (20 mg total) by mouth at bedtime.   azelastine 0.1 % nasal spray Commonly known as: ASTELIN Place 1-2 sprays into both nostrils 2 (two) times daily. Use in each nostril as directed   Combigan 0.2-0.5 % ophthalmic solution Generic drug: brimonidine-timolol INSTILL 1 DROP IN BOTH EYES TWICE A DAY   fenofibrate 145 MG tablet Commonly known as: TRICOR Take 1 tablet (145 mg total) by mouth daily.   metFORMIN 1000 MG tablet Commonly known as: GLUCOPHAGE Take 1 tablet (1,000 mg total) by mouth 2 (two) times daily with a meal.   metoprolol tartrate 50 MG tablet Commonly known as: LOPRESSOR Take 1 tablet (50 mg total) by mouth 2 (two) times daily.   pantoprazole 40 MG tablet Commonly known as: PROTONIX Take 1 tablet (40 mg total) by mouth daily before breakfast.   telmisartan 80 MG tablet Commonly known as: MICARDIS Take 1 tablet (80 mg total) by mouth daily.          Objective:   Physical Exam BP 126/72 (  BP Location: Left Arm, Patient Position: Sitting, Cuff Size: Large)   Pulse (!) 56   Temp (!) 97 F (36.1 C) (Temporal)   Ht 5\' 9"  (1.753 m)   Wt 220 lb (99.8 kg)   SpO2 97%   BMI 32.49 kg/m  General:   Well developed, NAD, BMI noted. HEENT:  Normocephalic . Face symmetric, atraumatic Lungs:  CTA B Normal respiratory effort, no intercostal retractions, no accessory muscle use. Heart: RRR,  no murmur.  DM foot exam: No edema, good pedal pulses, pinprick normal. Skin: Not pale. Not jaundice Neurologic:  alert & oriented X3.  Speech normal, gait appropriate for age and unassisted Psych--  Cognition and judgment appear intact.  Cooperative with normal attention span and concentration.  Behavior appropriate. No anxious or depressed appearing.       Assessment     Assessment DM  A1c 6.5 (05-2016)  Morbid obesity HTN Hyperlipidemia Coronary calcifications: Per CT, 01/2020 (Rx CV RF) Low testosterone: Saw endocrinology 06/2014 , mild hypogonadotropic hypogonadism. no rx recommended  Decreased libido , ED: not interested in Rx Low iron 2016: rechecked >> normal  OSA -- on CPAP , rx Dr Elsworth Soho GERD ( dx based on response to cough to PPIs) Decreased L carotid pulse? Normal carotid ultrasound 2008 [facility] 2016- sees derm q year Dr Ronnald Ramp as off 08/2018 Glaucoma    BPH Chest 01/2020: Bronchiectasis.  Scattered solid pulmonary nodules stable.  Three-vessel CAD.  Hepatic steatosis.  Ectatic ascending thoracic aorta unchanged  PLAN: DM: On metformin, takes daily walks, last A1c 5.9, recheck A1c.  Foot exam normal today. HTN: BP is great, normal BPs at home, continue Micardis, metoprolol and amlodipine.  Check BMP. EKG today: NSR Coronary calcifications: asx, plan: control CV RF.  Does not take ASA regularly, patient is advised to qd GERD: Takes leftover pantoprazole as needed with good results Vaccination counseling: Had 4 COVID vaccines, PNM 23 today, rec Shingrix No. 2 RTC 6 months    This visit occurred during the SARS-CoV-2 public health emergency.  Safety protocols were in place, including screening questions prior to the visit, additional usage of staff PPE, and extensive cleaning of exam room while observing appropriate contact time as indicated for disinfecting solutions.

## 2020-06-08 NOTE — Assessment & Plan Note (Signed)
DM: On metformin, takes daily walks, last A1c 5.9, recheck A1c.  Foot exam normal today. HTN: BP is great, normal BPs at home, continue Micardis, metoprolol and amlodipine.  Check BMP. EKG today: NSR Coronary calcifications: asx, plan: control CV RF.  Does not take ASA regularly, patient is advised to qd GERD: Takes leftover pantoprazole as needed with good results Vaccination counseling: Had 4 COVID vaccines, PNM 23 today, rec Shingrix No. 2 RTC 6 months

## 2020-06-14 ENCOUNTER — Encounter: Payer: Self-pay | Admitting: Internal Medicine

## 2020-07-31 ENCOUNTER — Encounter: Payer: Self-pay | Admitting: Internal Medicine

## 2020-08-01 ENCOUNTER — Encounter: Payer: Self-pay | Admitting: Adult Health

## 2020-08-01 ENCOUNTER — Ambulatory Visit (INDEPENDENT_AMBULATORY_CARE_PROVIDER_SITE_OTHER): Payer: Medicare HMO | Admitting: Adult Health

## 2020-08-01 ENCOUNTER — Other Ambulatory Visit: Payer: Self-pay

## 2020-08-01 DIAGNOSIS — R911 Solitary pulmonary nodule: Secondary | ICD-10-CM | POA: Diagnosis not present

## 2020-08-01 DIAGNOSIS — J479 Bronchiectasis, uncomplicated: Secondary | ICD-10-CM

## 2020-08-01 DIAGNOSIS — K219 Gastro-esophageal reflux disease without esophagitis: Secondary | ICD-10-CM

## 2020-08-01 DIAGNOSIS — G4733 Obstructive sleep apnea (adult) (pediatric): Secondary | ICD-10-CM

## 2020-08-01 NOTE — Assessment & Plan Note (Signed)
Mild bronchiectasis noted on CT.  Patient has no significant symptoms.  Have encouraged him to continue on trigger prevention for allergic rhinitis and GERD.  Currently asymptomatic.  We will continue to follow clinically.  Plan  Patient Instructions  Continue on CPAP At bedtime  .  Work on healthy weight .  Do not drive if sleepy .  GERD diet  Protonix daily as needed for reflux .  Continue on Claritin , Astelin and Flonase daily As needed   Mucinex DM As needed  Cough/congestion .  Follow up with Dr. Elsworth Soho  In 1 year  and As needed

## 2020-08-01 NOTE — Assessment & Plan Note (Signed)
Gerd diet  PPI As needed

## 2020-08-01 NOTE — Patient Instructions (Addendum)
Continue on CPAP At bedtime  .  Work on healthy weight .  Do not drive if sleepy .  GERD diet  Protonix daily as needed for reflux .  Continue on Claritin , Astelin and Flonase daily As needed   Mucinex DM As needed  Cough/congestion .  Follow up with Dr. Elsworth Soho  In 1 year  and As needed

## 2020-08-01 NOTE — Assessment & Plan Note (Signed)
CT chest 2021 showed some tree-in-bud opacities in the right upper lobe with associated bronchiectasis.  Patient did have some stable scattered pulmonary nodules up to 5 mm.  Patient is a never smoker.  These appear to be benign in etiology.  Patient has no active symptoms.  We will continue to follow clinically.  No further imaging appears indicated at this time.

## 2020-08-01 NOTE — Addendum Note (Signed)
Addended by: Amado Coe on: 08/01/2020 10:04 AM   Modules accepted: Orders

## 2020-08-01 NOTE — Progress Notes (Signed)
@Patient  ID: Jeffery Cline, male    DOB: 12-Sep-1945, 75 y.o.   MRN: [id number]  Chief Complaint  Patient presents with   Follow-up    Referring provider: Colon Branch, MD  HPI: 75 yr old male never smoker followed for OSA and lung nodules   TEST/EVENTS :  HST 07/2014 showed moderate OSA with AHI 27/hour and lowest desaturation of 79%. Total sleep time was 8 hours.   CT chest January 11, 2019 clustered tree-in-bud nodularity right upper lobe, scattered tiny bilateral pulmonary nodules measuring up to 5 mm.  CT chest done January 22, 2020 showed tree-in-bud opacities in the right upper lobe mildly increased with associated bronchiectasis.  Stable scattered pulmonary nodules measuring up to 5 mm consistent with benign etiology   08/01/2020 Follow up : OSA and Lung nodules  Patient returns for a 49-month follow-up.  Patient has underlying obstructive sleep apnea is on nocturnal CPAP.  Patient says he is doing very well on CPAP.  He wears his CPAP every night feels that he benefits from CPAP without any significant daytime sleepiness.  CPAP download shows excellent compliance with daily average usage at 9 hours.  With 100% compliance.  Patient is on CPAP 8 cm H2O.  AHI is 1.8.  Minimal leaks.  Patient says he remains very active.  Typically walks about 2 miles each day. No dyspnea. Remains busy at home.   CT chest has shown some scattered nodularity and bronchiectasis.  Most recent CT follow-up in December 2021 showed tree-in-bud opacities in the right upper lobe with associated bronchiectasis and stable scattered pulmonary nodules measuring up to 5 mm with a appearance of benign etiology Patient denies any significant cough.  Has not been on any antibiotics recently.  Says overall he feels very well.  Denies any unintentional weight loss.  Denies any hemoptysis.  He is a lifelong never smoker. Does have some sinus congestion and drainage. Uses flonase and claritin As needed    Covid  vaccine utd including 2 boosters.     No Known Allergies  Immunization History  Administered Date(s) Administered   Influenza Split 11/02/2013   Influenza Whole 11/23/2007, 10/09/2009, 11/09/2012   Influenza, High Dose Seasonal PF 11/18/2016, 09/26/2018, 11/22/2019   Influenza,inj,Quad PF,6+ Mos 12/06/2014   Influenza-Unspecified 11/15/2015, 11/26/2017   Moderna SARS-COV2 Booster Vaccination 12/19/2019, 05/20/2020   Moderna Sars-Covid-2 Vaccination 03/06/2019, 04/03/2019   Pneumococcal Conjugate-13 04/02/2014   Pneumococcal Polysaccharide-23 01/20/2011, 06/07/2020   Td 06/07/1998, 09/18/2008   Tdap 02/15/2019   Zoster Recombinat (Shingrix) 02/15/2019   Zoster, Live 02/21/2008    Past Medical History:  Diagnosis Date   Allergic rhinitis    Allergy    Basal cell carcinoma 2016   Right medial posterior shoulder, shave and ED&C   Cataract    forming    Decreased carotid pulse    decreased L carotid pulse (?) : Nl carotidu/s 02-2006     ED (erectile dysfunction)    GERD (gastroesophageal reflux disease)    Glaucoma    Hyperlipidemia    Hypertension    SCC (squamous cell carcinoma)    Sleep apnea    on CPAP    Tobacco History: Social History   Tobacco Use  Smoking Status Never  Smokeless Tobacco Never  Tobacco Comments   Socially in college.   Counseling given: Not Answered Tobacco comments: Socially in college.   Outpatient Medications Prior to Visit  Medication Sig Dispense Refill   amLODipine (NORVASC) 5 MG tablet Take 1  tablet (5 mg total) by mouth daily. 90 tablet 0   aspirin EC 81 MG tablet Take 81 mg by mouth daily. Swallow whole.     atorvastatin (LIPITOR) 20 MG tablet Take 1 tablet (20 mg total) by mouth at bedtime. 90 tablet 3   azelastine (ASTELIN) 0.1 % nasal spray Place 1-2 sprays into both nostrils 2 (two) times daily. Use in each nostril as directed     COMBIGAN 0.2-0.5 % ophthalmic solution INSTILL 1 DROP IN BOTH EYES TWICE A DAY  3    fenofibrate (TRICOR) 145 MG tablet Take 1 tablet (145 mg total) by mouth daily. 90 tablet 0   metFORMIN (GLUCOPHAGE) 1000 MG tablet Take 1 tablet (1,000 mg total) by mouth 2 (two) times daily with a meal. 180 tablet 1   metoprolol tartrate (LOPRESSOR) 50 MG tablet Take 1 tablet (50 mg total) by mouth 2 (two) times daily. 180 tablet 0   pantoprazole (PROTONIX) 40 MG tablet Take 1 tablet (40 mg total) by mouth daily before breakfast.     telmisartan (MICARDIS) 80 MG tablet Take 1 tablet (80 mg total) by mouth daily. 90 tablet 0   albuterol (VENTOLIN HFA) 108 (90 Base) MCG/ACT inhaler Inhale 2 puffs into the lungs every 6 (six) hours as needed for wheezing or shortness of breath. (Patient not taking: No sig reported) 1 each 1   No facility-administered medications prior to visit.     Review of Systems:   Constitutional:   No  weight loss, night sweats,  Fevers, chills, fatigue, or  lassitude.  HEENT:   No headaches,  Difficulty swallowing,  Tooth/dental problems, or  Sore throat,                No sneezing, itching, ear ache,  +nasal congestion, post nasal drip,   CV:  No chest pain,  Orthopnea, PND, swelling in lower extremities, anasarca, dizziness, palpitations, syncope.   GI  No heartburn, indigestion, abdominal pain, nausea, vomiting, diarrhea, change in bowel habits, loss of appetite, bloody stools.   Resp: No shortness of breath with exertion or at rest.  No excess mucus, no productive cough,  No non-productive cough,  No coughing up of blood.  No change in color of mucus.  No wheezing.  No chest wall deformity  Skin: no rash or lesions.  GU: no dysuria, change in color of urine, no urgency or frequency.  No flank pain, no hematuria   MS:  No joint pain or swelling.  No decreased range of motion.  No back pain.    Physical Exam  BP 118/60   Pulse (!) 59   Ht 5\' 9"  (1.753 m)   Wt 226 lb 6.4 oz (102.7 kg)   SpO2 97%   BMI 33.43 kg/m   GEN: A/Ox3; pleasant , NAD, well  nourished    HEENT:  [place]/AT,   , NOSE-clear, THROAT-clear, no lesions, no postnasal drip or exudate noted.   NECK:  Supple w/ fair ROM; no JVD; normal carotid impulses w/o bruits; no thyromegaly or nodules palpated; no lymphadenopathy.    RESP  Clear  P & A; w/o, wheezes/ rales/ or rhonchi. no accessory muscle use, no dullness to percussion  CARD:  RRR, no m/r/g, tr  peripheral edema, pulses intact, no cyanosis or clubbing.  GI:   Soft & nt; nml bowel sounds; no organomegaly or masses detected.   Musco: Warm bil, no deformities or joint swelling noted.   Neuro: alert, no focal deficits noted.  Skin: Warm, no lesions or rashes    Lab Results:  CBC   No results found for: BNP  ProBNP No results found for: PROBNP  Imaging: No results found.    No flowsheet data found.  No results found for: NITRICOXIDE      Assessment & Plan:   Bronchiectasis without complication (HCC) Mild bronchiectasis noted on CT.  Patient has no significant symptoms.  Have encouraged him to continue on trigger prevention for allergic rhinitis and GERD.  Currently asymptomatic.  We will continue to follow clinically.  Plan  Patient Instructions  Continue on CPAP At bedtime  .  Work on healthy weight .  Do not drive if sleepy .  GERD diet  Protonix daily as needed for reflux .  Continue on Claritin , Astelin and Flonase daily As needed   Mucinex DM As needed  Cough/congestion .  Follow up with Dr. Elsworth Soho  In 1 year  and As needed       GERD (gastroesophageal reflux disease) Jerrye Bushy diet  PPI As needed    OSA (obstructive sleep apnea) Excellent control and compliance   Plan  Patient Instructions  Continue on CPAP At bedtime  .  Work on healthy weight .  Do not drive if sleepy .  GERD diet  Protonix daily as needed for reflux .  Continue on Claritin , Astelin and Flonase daily As needed   Mucinex DM As needed  Cough/congestion .  Follow up with Dr. Elsworth Soho  In 1 year  and As needed        Pulmonary nodule seen on imaging study CT chest 2021 showed some tree-in-bud opacities in the right upper lobe with associated bronchiectasis.  Patient did have some stable scattered pulmonary nodules up to 5 mm.  Patient is a never smoker.  These appear to be benign in etiology.  Patient has no active symptoms.  We will continue to follow clinically.  No further imaging appears indicated at this time.   I spent   30 minutes dedicated to the care of this patient on the date of this encounter to include pre-visit review of records, face-to-face time with the patient discussing conditions above, post visit ordering of testing, clinical documentation with the electronic health record, making appropriate referrals as documented, and communicating necessary findings to members of the patients care team.    Rexene Edison, NP 08/01/2020

## 2020-08-01 NOTE — Assessment & Plan Note (Signed)
Excellent control and compliance   Plan  Patient Instructions  Continue on CPAP At bedtime  .  Work on healthy weight .  Do not drive if sleepy .  GERD diet  Protonix daily as needed for reflux .  Continue on Claritin , Astelin and Flonase daily As needed   Mucinex DM As needed  Cough/congestion .  Follow up with Dr. Elsworth Soho  In 1 year  and As needed

## 2020-08-08 ENCOUNTER — Other Ambulatory Visit: Payer: Self-pay | Admitting: Internal Medicine

## 2020-08-15 ENCOUNTER — Other Ambulatory Visit: Payer: Self-pay | Admitting: Internal Medicine

## 2020-09-18 ENCOUNTER — Other Ambulatory Visit: Payer: Self-pay | Admitting: Internal Medicine

## 2020-10-10 LAB — HM DIABETES EYE EXAM

## 2020-10-12 ENCOUNTER — Other Ambulatory Visit: Payer: Self-pay | Admitting: Internal Medicine

## 2020-10-30 ENCOUNTER — Ambulatory Visit: Payer: Medicare HMO | Attending: Internal Medicine

## 2020-10-30 DIAGNOSIS — Z23 Encounter for immunization: Secondary | ICD-10-CM

## 2020-10-30 NOTE — Progress Notes (Signed)
   Covid-19 Vaccination [facility]  Name:  NIKAN ELLINGSON    MRN: [id number] DOB: [birth year]  10/30/2020  Mr. Vicencio was observed post Covid-19 immunization for 15 minutes without incident. He was provided with Vaccine Information Sheet and instruction to access the V-Safe system.   Mr. Petteway was instructed to call 911 with any severe reactions post vaccine: Difficulty breathing  Swelling of face and throat  A fast heartbeat  A bad rash all over body  Dizziness and weakness

## 2020-11-05 ENCOUNTER — Other Ambulatory Visit (HOSPITAL_BASED_OUTPATIENT_CLINIC_OR_DEPARTMENT_OTHER): Payer: Self-pay

## 2020-11-05 MED ORDER — COVID-19MRNA BIVAL VACC PFIZER 30 MCG/0.3ML IM SUSP
INTRAMUSCULAR | 0 refills | Status: DC
  Filled 2020-11-05: qty 0.3, 1d supply, fill #0

## 2020-11-07 NOTE — Progress Notes (Addendum)
Subjective:   Jeffery Cline is a 75 y.o. male who presents for Medicare Annual/Subsequent preventive examination.   Review of Systems     Cardiac Risk Factors include: advanced age (>52men, >24 women);diabetes mellitus;dyslipidemia;hypertension;obesity (BMI >30kg/m2);male gender     Objective:    Today's Vitals   11/11/20 0810  BP: 140/70  Pulse: (!) 49  Resp: 16  Temp: 98 F (36.7 C)  TempSrc: Temporal  SpO2: 96%  Weight: 226 lb (102.5 kg)  Height: 5\' 9"  (1.753 m)   Body mass index is 33.37 kg/m.  Advanced Directives 11/11/2020 12/03/2018 02/10/2017 05/19/2016 02/28/2015 12/18/2014 11/27/2014  Does Patient Have a Medical Advance Directive? Yes No Yes Yes Yes Yes Yes  Type of Paramedic of [place];Living will - [place];Living will [place];Living will [place];Living will [place];Living will -  Does patient Cline to make changes to medical advance directive? - - - - - No - Patient declined -  Copy of [place] in Chart? No - copy requested - - No - copy requested - No - copy requested No - copy requested  Would patient like information on creating a medical advance directive? - No - Patient declined - - - - -    Current Medications (verified) Outpatient Encounter Medications as of 11/11/2020  Medication Sig   amLODipine (NORVASC) 5 MG tablet Take 1 tablet (5 mg total) by mouth daily.   aspirin EC 81 MG tablet Take 81 mg by mouth daily. Swallow whole.   atorvastatin (LIPITOR) 20 MG tablet Take 1 tablet (20 mg total) by mouth at bedtime.   COMBIGAN 0.2-0.5 % ophthalmic solution INSTILL 1 DROP IN BOTH EYES TWICE A DAY   fenofibrate (TRICOR) 145 MG tablet TAKE 1 TABLET BY MOUTH EVERY DAY   metFORMIN (GLUCOPHAGE) 1000 MG tablet Take 1 tablet (1,000 mg total) by mouth 2 (two) times daily with a meal.   metoprolol tartrate (LOPRESSOR) 50 MG tablet Take 1  tablet (50 mg total) by mouth 2 (two) times daily.   telmisartan (MICARDIS) 80 MG tablet Take 1 tablet (80 mg total) by mouth daily.   azelastine (ASTELIN) 0.1 % nasal spray Place 1-2 sprays into both nostrils 2 (two) times daily. Use in each nostril as directed (Patient not taking: Reported on 11/11/2020)   COVID-19 mRNA bivalent vaccine, Pfizer, injection Inject into the muscle. (Patient not taking: Reported on 11/11/2020)   pantoprazole (PROTONIX) 40 MG tablet Take 1 tablet (40 mg total) by mouth daily before breakfast. (Patient not taking: Reported on 11/11/2020)   [DISCONTINUED] albuterol (VENTOLIN HFA) 108 (90 Base) MCG/ACT inhaler Inhale 2 puffs into the lungs every 6 (six) hours as needed for wheezing or shortness of breath. (Patient not taking: No sig reported)   No facility-administered encounter medications on file as of 11/11/2020.    Allergies (verified) Patient has no known allergies.   History: Past Medical History:  Diagnosis Date   Allergic rhinitis    Allergy    Basal cell carcinoma 2016   Right medial posterior shoulder, shave and ED&C   Cataract    forming    Decreased carotid pulse    decreased L carotid pulse (?) : Nl carotidu/s 02-2006     ED (erectile dysfunction)    GERD (gastroesophageal reflux disease)    Glaucoma    Hyperlipidemia    Hypertension    SCC (squamous cell carcinoma)    Sleep apnea  on CPAP   Past Surgical History:  Procedure Laterality Date   CYSTECTOMY     back, recurrent, last 01-2016   INGUINAL HERNIA REPAIR     right by dr. Ninfa Linden   MOHS SURGERY Left 11/2018   face, betwen nose-ear    PILONIDAL CYST EXCISION     POLYPECTOMY     TONSILLECTOMY     Family History  Problem Relation Age of Onset   Dementia Mother    Alcohol abuse Mother    Coronary artery disease Father 17   Coronary artery disease Maternal Grandfather    Leukemia Paternal Grandfather    Colon cancer Neg Hx    Prostate cancer Neg Hx    Colon polyps Neg Hx     Rectal cancer Neg Hx    Stomach cancer Neg Hx    Social History   Socioeconomic History   Marital status: Married    Spouse name: Not on file   Number of children: 2   Years of education: Not on file   Highest education level: Not on file  Occupational History   Occupation: retired  Tobacco Use   Smoking status: Never   Smokeless tobacco: Never   Tobacco comments:    Socially in college.  Substance and Sexual Activity   Alcohol use: Not Currently    Comment:     Drug use: No   Sexual activity: Not Currently  Other Topics Concern   Not on file  Social History Narrative   ** Merged History Encounter **       Married. 2 sons ([place] and [place]) , 5 grandkids     Social Determinants of Radio broadcast assistant Strain: Low Risk    Difficulty of Paying Living Expenses: Not hard at all  Food Insecurity: No Food Insecurity   Worried About Charity fundraiser in the Last Year: Never true   Arboriculturist in the Last Year: Never true  Transportation Needs: No Transportation Needs   Lack of Transportation (Medical): No   Lack of Transportation (Non-Medical): No  Physical Activity: Sufficiently Active   Days of Exercise per Week: 7 days   Minutes of Exercise per Session: 40 min  Stress: No Stress Concern Present   Feeling of Stress : Not at all  Social Connections: Socially Integrated   Frequency of Communication with Friends and Family: More than three times a week   Frequency of Social Gatherings with Friends and Family: More than three times a week   Attends Religious Services: More than 4 times per year   Active Member of [employer] or Organizations: Yes   Attends Music therapist: More than 4 times per year   Marital Status: Married    Tobacco Counseling Counseling given: Not Answered Tobacco comments: Socially in college.   Clinical Intake:  Pre-visit preparation completed: Yes  Pain : No/denies pain     BMI - recorded: 33.37 Nutritional Status:  BMI > 30  Obese Nutritional Risks: None Diabetes: Yes CBG done?: No Did pt. bring in CBG monitor from home?: No  How often do you need to have someone help you when you read instructions, pamphlets, or other written materials from your doctor or pharmacy?: 1 - Never  Diabetes:  Is the patient diabetic?  Yes  If diabetic, was a CBG obtained today?  No  Did the patient bring in their glucometer from home?  No  How often do you monitor your CBG's? never.  Financial Strains and Diabetes Management:  Are you having any financial strains with the device, your supplies or your medication? No .  Does the patient Cline to be seen by Chronic Care Management for management of their diabetes?  No  Would the patient like to be referred to a Nutritionist or for Diabetic Management?  No   Diabetic Exams:  Diabetic Eye Exam: Completed 06/06/2020.   Diabetic Foot Exam: Completed 06/07/2020.   Interpreter Needed?: No  Information entered by :: Caroleen Hamman LPN   Activities of Daily Living In your present state of health, do you have any difficulty performing the following activities: 11/11/2020 02/07/2020  Hearing? N N  Vision? N N  Difficulty concentrating or making decisions? N N  Walking or climbing stairs? N N  Dressing or bathing? N N  Doing errands, shopping? N N  Preparing Food and eating ? N -  Using the Toilet? N -  In the past six months, have you accidently leaked urine? N -  Do you have problems with loss of bowel control? N -  Managing your Medications? N -  Managing your Finances? N -  Housekeeping or managing your Housekeeping? N -  Some recent data might be hidden    Patient Care Team: Colon Branch, MD as PCP - General Donnie Mesa, MD as Consulting Physician (General Surgery) Calvert Cantor, MD as Consulting Physician (Ophthalmology) Danella Sensing, MD as Consulting Physician (Dermatology)  Indicate any recent Medical Services you may have received from other than  [facility] providers in the past year (date may be approximate).     Assessment:   This is a routine wellness examination for Yuriy.  Hearing/Vision screen Hearing Screening - Comments:: No issues Vision Screening - Comments:: Wears glasses Last eye exam-09/2020-Dr. Eulas Post  Dietary issues and exercise activities discussed: Current Exercise Habits: Home exercise routine, Type of exercise: walking, Time (Minutes): 40, Frequency (Times/Week): 7, Weekly Exercise (Minutes/Week): 280, Intensity: Mild, Exercise limited by: None identified   Goals Addressed               This Visit's Progress     Patient Stated     Drink at least 4 glasses of water per day. (pt-stated)   Not on track      Depression Screen PHQ 2/9 Scores 11/11/2020 02/07/2020 08/07/2019 08/17/2018 05/26/2017 05/19/2016 04/29/2015  PHQ - 2 Score 0 0 0 0 0 0 0    Fall Risk Fall Risk  11/11/2020 02/07/2020 10/25/2019 08/17/2018 05/26/2017  Falls in the past year? 0 0 0 0 No  Number falls in past yr: 0 0 0 0 -  Injury with Fall? 0 0 0 - -  Follow up Falls prevention discussed - Falls evaluation completed - -    FALL RISK PREVENTION PERTAINING TO THE HOME:  Any stairs in or around the home? Yes  If so, are there any without handrails? No  Home free of loose throw rugs in walkways, pet beds, electrical cords, etc? Yes  Adequate lighting in your home to reduce risk of falls? Yes   ASSISTIVE DEVICES UTILIZED TO PREVENT FALLS:  Life alert? No  Use of a cane, walker or w/c? No  Grab bars in the bathroom? Yes  Shower chair or bench in shower? No  Elevated toilet seat or a handicapped toilet? No   TIMED UP AND GO:  Was the test performed? Yes .  Length of time to ambulate 10 feet: 10 sec.   Gait steady and fast  without use of assistive device  Cognitive Function:        Immunizations Immunization History  Administered Date(s) Administered   Influenza Split 11/02/2013   Influenza Whole 11/23/2007, 10/09/2009,  11/09/2012   Influenza, High Dose Seasonal PF 11/18/2016, 09/26/2018, 11/22/2019   Influenza,inj,Quad PF,6+ Mos 12/06/2014   Influenza-Unspecified 11/15/2015, 11/26/2017   Moderna SARS-COV2 Booster Vaccination 12/19/2019, 05/20/2020   Moderna Sars-Covid-2 Vaccination 03/06/2019, 04/03/2019   Pfizer Covid-19 Vaccine Bivalent Booster 23yrs & up 10/30/2020   Pneumococcal Conjugate-13 04/02/2014   Pneumococcal Polysaccharide-23 01/20/2011, 06/07/2020   Td 06/07/1998, 09/18/2008   Tdap 02/15/2019   Zoster Recombinat (Shingrix) 02/15/2019   Zoster, Live 02/21/2008    TDAP status: Up to date  Flu Vaccine status: Due, Education has been provided regarding the importance of this vaccine. Advised may receive this vaccine at local pharmacy or Health Dept. Aware to provide a copy of the vaccination record if obtained from local pharmacy or Health Dept. Verbalized acceptance and understanding.  Pneumococcal vaccine status: Up to date  Covid-19 vaccine status: Completed vaccines  Qualifies for Shingles Vaccine? No   Zostavax completed No   Shingrix Completed?: No.    Education has been provided regarding the importance of this vaccine. Patient has been advised to call insurance company to determine out of pocket expense if they have not yet received this vaccine. Advised may also receive vaccine at local pharmacy or Health Dept. Verbalized acceptance and understanding.  Screening Tests Health Maintenance  Topic Date Due   Zoster Vaccines- Shingrix (2 of 2) 04/12/2019   INFLUENZA VACCINE  09/09/2020   COVID-19 Vaccine (5 - Booster for Moderna series) 09/19/2020   HEMOGLOBIN A1C  12/07/2020   OPHTHALMOLOGY EXAM  06/06/2021   FOOT EXAM  06/07/2021   COLONOSCOPY (Pts 45-26yrs Insurance coverage will need to be confirmed)  02/24/2022   TETANUS/TDAP  02/14/2029   Hepatitis C Screening  Completed   HPV VACCINES  Aged Out    Health Maintenance  Health Maintenance Due  Topic Date Due   Zoster  Vaccines- Shingrix (2 of 2) 04/12/2019   INFLUENZA VACCINE  09/09/2020   COVID-19 Vaccine (5 - Booster for Moderna series) 09/19/2020    Colorectal cancer screening: Type of screening: Colonoscopy. Completed 02/24/2017. Repeat every 5 years  Lung Cancer Screening: (Low Dose CT Chest recommended if Age 70-80 years, 30 pack-year currently smoking OR have quit w/in 15years.) does not qualify.     Additional Screening:  Hepatitis C Screening: Completed 05/20/2016  Vision Screening: Recommended annual ophthalmology exams for early detection of glaucoma and other disorders of the eye. Is the patient up to date with their annual eye exam?  Yes  Who is the provider or what is the name of the office in which the patient attends annual eye exams? Dr. Eulas Post   Dental Screening: Recommended annual dental exams for proper oral hygiene  Community Resource Referral / Chronic Care Management: CRR required this visit?  No   CCM required this visit?  No      Plan:     I have personally reviewed and noted the following in the patient's chart:   Medical and social history Use of alcohol, tobacco or illicit drugs  Current medications and supplements including opioid prescriptions. Patient is not currently taking opioid prescriptions. Functional ability and status Nutritional status Physical activity Advanced directives List of other physicians Hospitalizations, surgeries, and ER visits in previous 12 months Vitals Screenings to include cognitive, depression, and falls Referrals and appointments  In  addition, I have reviewed and discussed with patient certain preventive protocols, quality metrics, and best practice recommendations. A written personalized care plan for preventive services as well as general preventive health recommendations were provided to patient.    Patient would like to access avs on my-chart.  Marta Antu, LPN   22/08/7373  Nurse Health Advisor  Nurse Notes:  None  I have reviewed and agree with [facility] documentation.  Kathlene November, MD

## 2020-11-11 ENCOUNTER — Other Ambulatory Visit: Payer: Self-pay

## 2020-11-11 ENCOUNTER — Ambulatory Visit (INDEPENDENT_AMBULATORY_CARE_PROVIDER_SITE_OTHER): Payer: Medicare HMO

## 2020-11-11 VITALS — BP 140/70 | HR 49 | Temp 98.0°F | Resp 16 | Ht 69.0 in | Wt 226.0 lb

## 2020-11-11 DIAGNOSIS — Z0000 Encounter for general adult medical examination without abnormal findings: Secondary | ICD-10-CM

## 2020-11-11 NOTE — Patient Instructions (Signed)
[name] , Thank you for taking time to come for your Medicare Wellness Visit. I appreciate your ongoing commitment to your health goals. Please review the following plan we discussed and let me know if I can assist you in the future.   Screening recommendations/referrals: Colonoscopy: Completed 02/24/2017-Due 02/24/2022 Recommended yearly ophthalmology/optometry visit for glaucoma screening and checkup Recommended yearly dental visit for hygiene and checkup  Vaccinations: Influenza vaccine: Due-May obtain vaccine at our office or your local pharmacy. Pneumococcal vaccine: UP to date Tdap vaccine: Up to date.Due-02/14/2029 Shingles vaccine: Discuss 2nd dose with pharmacy.   Covid-19: Up to date  Advanced directives: Please bring a copy for your chart  Conditions/risks identified: See problem list  Next appointment: Follow up in one year for your annual wellness visit. 11/17/2021 @ 8:20  Preventive Care 65 Years and Older, Male Preventive care refers to lifestyle choices and visits with your health care provider that can promote health and wellness. What does preventive care include? A yearly physical exam. This is also called an annual well check. Dental exams once or twice a year. Routine eye exams. Ask your health care provider how often you should have your eyes checked. Personal lifestyle choices, including: Daily care of your teeth and gums. Regular physical activity. Eating a healthy diet. Avoiding tobacco and drug use. Limiting alcohol use. Practicing safe sex. Taking low doses of aspirin every day. Taking vitamin and mineral supplements as recommended by your health care provider. What happens during an annual well check? The services and screenings done by your health care provider during your annual well check will depend on your age, overall health, lifestyle risk factors, and family history of disease. Counseling  Your health care provider may ask you questions about  your: Alcohol use. Tobacco use. Drug use. Emotional well-being. Home and relationship well-being. Sexual activity. Eating habits. History of falls. Memory and ability to understand (cognition). Work and work Statistician. Screening  You may have the following tests or measurements: Height, weight, and BMI. Blood pressure. Lipid and cholesterol levels. These may be checked every 5 years, or more frequently if you are over 22 years old. Skin check. Lung cancer screening. You may have this screening every year starting at age 50 if you have a 30-pack-year history of smoking and currently smoke or have quit within the past 15 years. Fecal occult blood test (FOBT) of the stool. You may have this test every year starting at age 84. Flexible sigmoidoscopy or colonoscopy. You may have a sigmoidoscopy every 5 years or a colonoscopy every 10 years starting at age 68. Prostate cancer screening. Recommendations will vary depending on your family history and other risks. Hepatitis C blood test. Hepatitis B blood test. Sexually transmitted disease (STD) testing. Diabetes screening. This is done by checking your blood sugar (glucose) after you have not eaten for a while (fasting). You may have this done every 1-3 years. Abdominal aortic aneurysm (AAA) screening. You may need this if you are a current or former smoker. Osteoporosis. You may be screened starting at age 10 if you are at high risk. Talk with your health care provider about your test results, treatment options, and if necessary, the need for more tests. Vaccines  Your health care provider may recommend certain vaccines, such as: Influenza vaccine. This is recommended every year. Tetanus, diphtheria, and acellular pertussis (Tdap, Td) vaccine. You may need a Td booster every 10 years. Zoster vaccine. You may need this after age 53. Pneumococcal 13-valent conjugate (PCV13)  vaccine. One dose is recommended after age 18. Pneumococcal  polysaccharide (PPSV23) vaccine. One dose is recommended after age 26. Talk to your health care provider about which screenings and vaccines you need and how often you need them. This information is not intended to replace advice given to you by your health care provider. Make sure you discuss any questions you have with your health care provider. Document Released: 02/22/2015 Document Revised: 10/16/2015 Document Reviewed: 11/27/2014 Elsevier Interactive Patient Education  2017 [place] Prevention in the Home Falls can cause injuries. They can happen to people of all ages. There are many things you can do to make your home safe and to help prevent falls. What can I do on the outside of my home? Regularly fix the edges of walkways and driveways and fix any cracks. Remove anything that might make you trip as you walk through a door, such as a raised step or threshold. Trim any bushes or trees on the path to your home. Use bright outdoor lighting. Clear any walking paths of anything that might make someone trip, such as rocks or tools. Regularly check to see if handrails are loose or broken. Make sure that both sides of any steps have handrails. Any raised decks and porches should have guardrails on the edges. Have any leaves, snow, or ice cleared regularly. Use sand or salt on walking paths during winter. Clean up any spills in your garage right away. This includes oil or grease spills. What can I do in the bathroom? Use night lights. Install grab bars by the toilet and in the tub and shower. Do not use towel bars as grab bars. Use non-skid mats or decals in the tub or shower. If you need to sit down in the shower, use a plastic, non-slip stool. Keep the floor dry. Clean up any water that spills on the floor as soon as it happens. Remove soap buildup in the tub or shower regularly. Attach bath mats securely with double-sided non-slip rug tape. Do not have throw rugs and other  things on the floor that can make you trip. What can I do in the bedroom? Use night lights. Make sure that you have a light by your bed that is easy to reach. Do not use any sheets or blankets that are too big for your bed. They should not hang down onto the floor. Have a firm chair that has side arms. You can use this for support while you get dressed. Do not have throw rugs and other things on the floor that can make you trip. What can I do in the kitchen? Clean up any spills right away. Avoid walking on wet floors. Keep items that you use a lot in easy-to-reach places. If you need to reach something above you, use a strong step stool that has a grab bar. Keep electrical cords out of the way. Do not use floor polish or wax that makes floors slippery. If you must use wax, use non-skid floor wax. Do not have throw rugs and other things on the floor that can make you trip. What can I do with my stairs? Do not leave any items on the stairs. Make sure that there are handrails on both sides of the stairs and use them. Fix handrails that are broken or loose. Make sure that handrails are as long as the stairways. Check any carpeting to make sure that it is firmly attached to the stairs. Fix any carpet that is loose  or worn. Avoid having throw rugs at the top or bottom of the stairs. If you do have throw rugs, attach them to the floor with carpet tape. Make sure that you have a light switch at the top of the stairs and the bottom of the stairs. If you do not have them, ask someone to add them for you. What else can I do to help prevent falls? Wear shoes that: Do not have high heels. Have rubber bottoms. Are comfortable and fit you well. Are closed at the toe. Do not wear sandals. If you use a stepladder: Make sure that it is fully opened. Do not climb a closed stepladder. Make sure that both sides of the stepladder are locked into place. Ask someone to hold it for you, if possible. Clearly  mark and make sure that you can see: Any grab bars or handrails. First and last steps. Where the edge of each step is. Use tools that help you move around (mobility aids) if they are needed. These include: Canes. Walkers. Scooters. Crutches. Turn on the lights when you go into a dark area. Replace any light bulbs as soon as they burn out. Set up your furniture so you have a clear path. Avoid moving your furniture around. If any of your floors are uneven, fix them. If there are any pets around you, be aware of where they are. Review your medicines with your doctor. Some medicines can make you feel dizzy. This can increase your chance of falling. Ask your doctor what other things that you can do to help prevent falls. This information is not intended to replace advice given to you by your health care provider. Make sure you discuss any questions you have with your health care provider. Document Released: 11/22/2008 Document Revised: 07/04/2015 Document Reviewed: 03/02/2014 Elsevier Interactive Patient Education  2017 [employer].

## 2020-12-05 ENCOUNTER — Ambulatory Visit: Payer: Medicare HMO | Admitting: Internal Medicine

## 2020-12-16 ENCOUNTER — Ambulatory Visit (INDEPENDENT_AMBULATORY_CARE_PROVIDER_SITE_OTHER): Payer: Medicare HMO | Admitting: Internal Medicine

## 2020-12-16 ENCOUNTER — Encounter: Payer: Self-pay | Admitting: Internal Medicine

## 2020-12-16 ENCOUNTER — Other Ambulatory Visit: Payer: Self-pay

## 2020-12-16 VITALS — BP 132/70 | HR 57 | Temp 98.1°F | Resp 18 | Ht 69.0 in | Wt 227.4 lb

## 2020-12-16 DIAGNOSIS — E1159 Type 2 diabetes mellitus with other circulatory complications: Secondary | ICD-10-CM | POA: Diagnosis not present

## 2020-12-16 DIAGNOSIS — I10 Essential (primary) hypertension: Secondary | ICD-10-CM | POA: Diagnosis not present

## 2020-12-16 DIAGNOSIS — J479 Bronchiectasis, uncomplicated: Secondary | ICD-10-CM | POA: Diagnosis not present

## 2020-12-16 DIAGNOSIS — E785 Hyperlipidemia, unspecified: Secondary | ICD-10-CM | POA: Diagnosis not present

## 2020-12-16 DIAGNOSIS — E119 Type 2 diabetes mellitus without complications: Secondary | ICD-10-CM | POA: Insufficient documentation

## 2020-12-16 LAB — BASIC METABOLIC PANEL
BUN: 16 mg/dL (ref 6–23)
CO2: 29 mEq/L (ref 19–32)
Calcium: 9.2 mg/dL (ref 8.4–10.5)
Chloride: 104 mEq/L (ref 96–112)
Creatinine, Ser: 0.96 mg/dL (ref 0.40–1.50)
GFR: 77.3 mL/min (ref >60.00)
Glucose, Bld: 101 mg/dL — ABNORMAL HIGH (ref 70–99)
Potassium: 4 mEq/L (ref 3.5–5.1)
Sodium: 139 mEq/L (ref 135–145)

## 2020-12-16 LAB — LIPID PANEL
Cholesterol: 146 mg/dL (ref 0–200)
HDL: 29.6 mg/dL — ABNORMAL LOW (ref >39.00)
LDL Cholesterol: 88 mg/dL (ref 0–99)
NonHDL: 116.37
Total CHOL/HDL Ratio: 5
Triglycerides: 143 mg/dL (ref 0.0–149.0)
VLDL: 28.6 mg/dL (ref 0.0–40.0)

## 2020-12-16 LAB — HEMOGLOBIN A1C: Hgb A1c MFr Bld: 6 % (ref 4.6–6.5)

## 2020-12-16 NOTE — Progress Notes (Signed)
Subjective:    Patient ID: Jeffery Cline, male    DOB: 1945-09-27, 75 y.o.   MRN: [id number]  DOS:  12/16/2020 Type of visit - description: rov  Today with talk about diabetes, CAD and  I reviewed the chart.  He is doing well, walks daily, denies chest pain or difficulty breathing.  No edema   Review of Systems See above   Past Medical History:  Diagnosis Date   Allergic rhinitis    Allergy    Basal cell carcinoma 2016   Right medial posterior shoulder, shave and ED&C   Cataract    forming    Decreased carotid pulse    decreased L carotid pulse (?) : Nl carotidu/s 02-2006     ED (erectile dysfunction)    GERD (gastroesophageal reflux disease)    Glaucoma    Hyperlipidemia    Hypertension    SCC (squamous cell carcinoma)    Sleep apnea    on CPAP    Past Surgical History:  Procedure Laterality Date   CYSTECTOMY     back, recurrent, last 01-2016   INGUINAL HERNIA REPAIR     right by dr. Ninfa Linden   MOHS SURGERY Left 11/2018   face, betwen nose-ear    PILONIDAL CYST EXCISION     POLYPECTOMY     TONSILLECTOMY      Allergies as of 12/16/2020   No Known Allergies      Medication List        Accurate as of December 16, 2020  6:26 PM. If you have any questions, ask your nurse or doctor.          STOP taking these medications    Pfizer COVID-19 Vac Bivalent injection Generic drug: COVID-19 mRNA bivalent vaccine Therapist, music) Stopped by: Kathlene November, MD       TAKE these medications    amLODipine 5 MG tablet Commonly known as: NORVASC Take 1 tablet (5 mg total) by mouth daily.   aspirin EC 81 MG tablet Take 81 mg by mouth daily. Swallow whole.   atorvastatin 20 MG tablet Commonly known as: LIPITOR Take 1 tablet (20 mg total) by mouth at bedtime.   azelastine 0.1 % nasal spray Commonly known as: ASTELIN Place 1-2 sprays into both nostrils 2 (two) times daily. Use in each nostril as directed   Combigan 0.2-0.5 % ophthalmic solution Generic drug:  brimonidine-timolol INSTILL 1 DROP IN BOTH EYES TWICE A DAY   fenofibrate 145 MG tablet Commonly known as: TRICOR TAKE 1 TABLET BY MOUTH EVERY DAY   metFORMIN 1000 MG tablet Commonly known as: GLUCOPHAGE Take 1 tablet (1,000 mg total) by mouth 2 (two) times daily with a meal.   metoprolol tartrate 50 MG tablet Commonly known as: LOPRESSOR Take 1 tablet (50 mg total) by mouth 2 (two) times daily.   pantoprazole 40 MG tablet Commonly known as: PROTONIX Take 1 tablet (40 mg total) by mouth daily before breakfast.   telmisartan 80 MG tablet Commonly known as: MICARDIS Take 1 tablet (80 mg total) by mouth daily.           Objective:   Physical Exam BP 132/70 (BP Location: Left Arm, Patient Position: Sitting, Cuff Size: Normal)   Pulse (!) 57   Temp 98.1 F (36.7 C) (Oral)   Resp 18   Ht 5\' 9"  (1.753 m)   Wt 227 lb 6 oz (103.1 kg)   SpO2 97%   BMI 33.58 kg/m  General: Well developed, NAD, BMI  noted Neck: No  thyromegaly  HEENT:  Normocephalic . Face symmetric, atraumatic Lungs:  CTA B Normal respiratory effort, no intercostal retractions, no accessory muscle use. Heart: RRR,  no murmur.  Abdomen:  Not distended, soft, non-tender. No rebound or rigidity.   Lower extremities: no pretibial edema bilaterally  Skin: Exposed areas without rash. Not pale. Not jaundice Neurologic:  alert & oriented X3.  Speech normal, gait appropriate for age and unassisted Strength symmetric and appropriate for age.  Psych: Cognition and judgment appear intact.  Cooperative with normal attention span and concentration.  Behavior appropriate. No anxious or depressed appearing.     Assessment    Assessment DM  A1c 6.5 (05-2016)  Obesity HTN Hyperlipidemia Coronary calcifications: Per CT, 01/2020 (Rx CV RF) Low testosterone: Saw endocrinology 06/2014 , mild hypogonadotropic hypogonadism. no rx recommended  Decreased libido , ED: not interested in Rx OSA -- on CPAP , rx Dr  Elsworth Soho GERD ( dx based on response to cough to PPIs) Decreased L carotid pulse? Normal carotid ultrasound 2008 [facility] 2016- sees derm q year Dr Ronnald Ramp as off 08/2018 Glaucoma    BPH Chest 01/2020: Bronchiectasis.  Scattered solid pulmonary nodules stable.  Three-vessel CAD.  Hepatic steatosis.  Ectatic ascending thoracic aorta unchanged  PLAN: DM: On metformin, checking labs. HTN: On amlodipine, metoprolol, Micardis.  Check BMP Dyslipidemia: On atorvastatin 20 mg, last LDL was 86, goal 70.  Checking labs. Coronary calcifications: Explained the patient that the key is to keep his CV RF control OSA, bronchiectasis, pulmonary nodules: Saw pulm 07/2020 >>>Was noted not to have any significant symptoms from bronchiectasis,routine follow-up CTs for nodules was not recommended at the time. Preventive care up-to-date on all shots except Shingrix RTC 4 to 5 months CPX    This visit occurred during the SARS-CoV-2 public health emergency.  Safety protocols were in place, including screening questions prior to the visit, additional usage of staff PPE, and extensive cleaning of exam room while observing appropriate contact time as indicated for disinfecting solutions.

## 2020-12-16 NOTE — Assessment & Plan Note (Signed)
DM: On metformin, checking labs. HTN: On amlodipine, metoprolol, Micardis.  Check BMP Dyslipidemia: On atorvastatin 20 mg, last LDL was 86, goal 70.  Checking labs. Coronary calcifications: Explained the patient that the key is to keep his CV RF control OSA, bronchiectasis, pulmonary nodules: Saw pulm 07/2020 >>>Was noted not to have any significant symptoms from bronchiectasis,routine follow-up CTs for nodules was not recommended at the time. Preventive care up-to-date on all shots except Shingrix RTC 4 to 5 months CPX

## 2020-12-16 NOTE — Patient Instructions (Addendum)
Recommend to proceed with Shingrix (shingles) #2 at your pharmacy.  Check the  blood pressure regulalrly  BP GOAL is between 110/65 and  135/85. If it is consistently higher or lower, let me know       GO TO THE LAB : Get the blood work     [place], [place] back for  a physical exam in 4 to5 months

## 2020-12-17 ENCOUNTER — Other Ambulatory Visit: Payer: Self-pay | Admitting: Internal Medicine

## 2020-12-19 MED ORDER — ATORVASTATIN CALCIUM 40 MG PO TABS
40.0000 mg | ORAL_TABLET | Freq: Every day | ORAL | 1 refills | Status: DC
Start: 2020-12-19 — End: 2021-06-16

## 2020-12-19 NOTE — Addendum Note (Signed)
Addended byDamita Dunnings D on: 12/19/2020 03:16 PM   Modules accepted: Orders

## 2021-02-03 ENCOUNTER — Other Ambulatory Visit: Payer: Self-pay | Admitting: Internal Medicine

## 2021-02-16 ENCOUNTER — Other Ambulatory Visit: Payer: Self-pay | Admitting: Internal Medicine

## 2021-02-23 ENCOUNTER — Other Ambulatory Visit: Payer: Self-pay | Admitting: Internal Medicine

## 2021-05-20 ENCOUNTER — Encounter: Payer: Self-pay | Admitting: Internal Medicine

## 2021-05-20 ENCOUNTER — Ambulatory Visit (INDEPENDENT_AMBULATORY_CARE_PROVIDER_SITE_OTHER): Payer: Medicare HMO | Admitting: Internal Medicine

## 2021-05-20 VITALS — BP 132/72 | HR 76 | Temp 98.2°F | Resp 18 | Ht 69.0 in | Wt 227.5 lb

## 2021-05-20 DIAGNOSIS — I10 Essential (primary) hypertension: Secondary | ICD-10-CM | POA: Diagnosis not present

## 2021-05-20 DIAGNOSIS — E1159 Type 2 diabetes mellitus with other circulatory complications: Secondary | ICD-10-CM | POA: Diagnosis not present

## 2021-05-20 DIAGNOSIS — Z0000 Encounter for general adult medical examination without abnormal findings: Secondary | ICD-10-CM

## 2021-05-20 DIAGNOSIS — E785 Hyperlipidemia, unspecified: Secondary | ICD-10-CM | POA: Diagnosis not present

## 2021-05-20 LAB — COMPREHENSIVE METABOLIC PANEL
ALT: 32 U/L (ref 0–53)
AST: 22 U/L (ref 0–37)
Albumin: 4.7 g/dL (ref 3.5–5.2)
Alkaline Phosphatase: 35 U/L — ABNORMAL LOW (ref 39–117)
BUN: 14 mg/dL (ref 6–23)
CO2: 28 mEq/L (ref 19–32)
Calcium: 9.6 mg/dL (ref 8.4–10.5)
Chloride: 103 mEq/L (ref 96–112)
Creatinine, Ser: 0.83 mg/dL (ref 0.40–1.50)
GFR: 85.34 mL/min (ref >60.00)
Glucose, Bld: 94 mg/dL (ref 70–99)
Potassium: 4 mEq/L (ref 3.5–5.1)
Sodium: 139 mEq/L (ref 135–145)
Total Bilirubin: 0.8 mg/dL (ref 0.2–1.2)
Total Protein: 6.7 g/dL (ref 6.0–8.3)

## 2021-05-20 LAB — CBC WITH DIFFERENTIAL/PLATELET
Basophils Absolute: 0.1 10*3/uL (ref 0.0–0.1)
Basophils Relative: 1 % (ref 0.0–3.0)
Eosinophils Absolute: 0.6 10*3/uL (ref 0.0–0.7)
Eosinophils Relative: 6.5 % — ABNORMAL HIGH (ref 0.0–5.0)
HCT: 42.1 % (ref 39.0–52.0)
Hemoglobin: 14.6 g/dL (ref 13.0–17.0)
Lymphocytes Relative: 27.4 % (ref 12.0–46.0)
Lymphs Abs: 2.7 10*3/uL (ref 0.7–4.0)
MCHC: 34.6 g/dL (ref 30.0–36.0)
MCV: 90.9 fl (ref 78.0–100.0)
Monocytes Absolute: 1.1 10*3/uL — ABNORMAL HIGH (ref 0.1–1.0)
Monocytes Relative: 10.9 % (ref 3.0–12.0)
Neutro Abs: 5.3 10*3/uL (ref 1.4–7.7)
Neutrophils Relative %: 54.2 % (ref 43.0–77.0)
Platelets: 202 10*3/uL (ref 150.0–400.0)
RBC: 4.64 Mil/uL (ref 4.22–5.81)
RDW: 13.3 % (ref 11.5–15.5)
WBC: 9.7 10*3/uL (ref 4.0–10.5)

## 2021-05-20 LAB — LIPID PANEL
Cholesterol: 148 mg/dL (ref 0–200)
HDL: 30.2 mg/dL — ABNORMAL LOW (ref >39.00)
LDL Cholesterol: 83 mg/dL (ref 0–99)
NonHDL: 117.99
Total CHOL/HDL Ratio: 5
Triglycerides: 174 mg/dL — ABNORMAL HIGH (ref 0.0–149.0)
VLDL: 34.8 mg/dL (ref 0.0–40.0)

## 2021-05-20 NOTE — Patient Instructions (Addendum)
Check the  blood pressure regularly ?BP GOAL is between 110/65 and  135/85. ?If it is consistently higher or lower, let me know ? ?Please read information about advance care planning ? ?GO TO THE LAB : Get the blood work   ? ? ?[place], [facility] ?Come back for   a ceck up in 6 months  ?

## 2021-05-20 NOTE — Progress Notes (Signed)
? ?Subjective:  ? ? Patient ID: Jeffery Cline, male    DOB: May 31, 1945, 76 y.o.   MRN: [id number] ? ?DOS:  05/20/2021 ?Type of visit - description: cpx ? ?Here for CPX ?Since the last office visit is doing well.  Has no major concerns. ?Occasionally has nocturia, not frequent, not severe, no other LUTS. ?Has GERD symptoms are sporadically only ? ?Review of Systems ? ?Other than above, a 14 point review of systems is negative  ? ?  ? ? ?Past Medical History:  ?Diagnosis Date  ? Allergic rhinitis   ? Allergy   ? Basal cell carcinoma 2016  ? Right medial posterior shoulder, shave and ED&C  ? Cataract   ? forming   ? Decreased carotid pulse   ? decreased L carotid pulse (?) : Nl carotidu/s 02-2006    ? ED (erectile dysfunction)   ? GERD (gastroesophageal reflux disease)   ? Glaucoma   ? Hyperlipidemia   ? Hypertension   ? SCC (squamous cell carcinoma)   ? Sleep apnea   ? on CPAP  ? ? ?Past Surgical History:  ?Procedure Laterality Date  ? CYSTECTOMY    ? back, recurrent, last 01-2016  ? INGUINAL HERNIA REPAIR    ? right by dr. Ninfa Linden  ? MOHS SURGERY Left 11/2018  ? face, betwen nose-ear   ? PILONIDAL CYST EXCISION    ? POLYPECTOMY    ? TONSILLECTOMY    ? ?Social History  ? ?Socioeconomic History  ? Marital status: Married  ?  Spouse name: Not on file  ? Number of children: 2  ? Years of education: Not on file  ? Highest education level: Not on file  ?Occupational History  ? Occupation: retired  ?Tobacco Use  ? Smoking status: Never  ? Smokeless tobacco: Never  ? Tobacco comments:  ?  Socially in college.  ?Substance and Sexual Activity  ? Alcohol use: Not Currently  ?  Comment:    ? Drug use: No  ? Sexual activity: Not Currently  ?Other Topics Concern  ? Not on file  ?Social History Narrative  ? ** Merged History Encounter **  ?    ? Married. 2 sons ([place] and [place]) , 5 grandkids    ? ?Social Determinants of Health  ? ?Financial Resource Strain: Low Risk   ? Difficulty of Paying Living Expenses: Not hard at all  ?Food  Insecurity: No Food Insecurity  ? Worried About Charity fundraiser in the Last Year: Never true  ? Ran Out of Food in the Last Year: Never true  ?Transportation Needs: No Transportation Needs  ? Lack of Transportation (Medical): No  ? Lack of Transportation (Non-Medical): No  ?Physical Activity: Sufficiently Active  ? Days of Exercise per Week: 7 days  ? Minutes of Exercise per Session: 40 min  ?Stress: No Stress Concern Present  ? Feeling of Stress : Not at all  ?Social Connections: Socially Integrated  ? Frequency of Communication with Friends and Family: More than three times a week  ? Frequency of Social Gatherings with Friends and Family: More than three times a week  ? Attends Religious Services: More than 4 times per year  ? Active Member of Clubs or Organizations: Yes  ? Attends Archivist Meetings: More than 4 times per year  ? Marital Status: Married  ?Intimate Partner Violence: Not At Risk  ? Fear of Current or Ex-Partner: No  ? Emotionally Abused: No  ? Physically Abused:  No  ? Sexually Abused: No  ? ? ?Current Outpatient Medications  ?Medication Instructions  ? albuterol (VENTOLIN HFA) 108 (90 Base) MCG/ACT inhaler TAKE 2 PUFFS BY MOUTH EVERY 6 HOURS AS NEEDED FOR WHEEZE OR SHORTNESS OF BREATH  ? amLODipine (NORVASC) 5 mg, Oral, Daily  ? aspirin EC 81 mg, Oral, Daily, Swallow whole.  ? atorvastatin (LIPITOR) 40 mg, Oral, Daily at bedtime  ? COMBIGAN 0.2-0.5 % ophthalmic solution INSTILL 1 DROP IN BOTH EYES TWICE A DAY  ? fenofibrate (TRICOR) 145 MG tablet TAKE 1 TABLET BY MOUTH EVERY DAY  ? metFORMIN (GLUCOPHAGE) 1,000 mg, Oral, 2 times daily with meals  ? metoprolol tartrate (LOPRESSOR) 50 MG tablet TAKE 1 TABLET BY MOUTH TWICE A DAY  ? pantoprazole (PROTONIX) 40 mg, Daily before breakfast  ? telmisartan (MICARDIS) 80 MG tablet TAKE 1 TABLET BY MOUTH EVERY DAY  ? ? ?   ?Objective:  ? Physical Exam ?BP 132/72 (BP Location: Left Arm, Patient Position: Sitting, Cuff Size: Normal)   Pulse 76    Temp 98.2 ?F (36.8 ?C) (Oral)   Resp 18   Ht '5\' 9"'$  (1.753 m)   Wt 227 lb 8 oz (103.2 kg)   SpO2 98%   BMI 33.60 kg/m?  ?General: ?Well developed, NAD, BMI noted ?Neck: No  thyromegaly  ?HEENT:  ?Normocephalic . Face symmetric, atraumatic ?Lungs:  ?CTA B ?Normal respiratory effort, no intercostal retractions, no accessory muscle use. ?Heart: RRR,  no murmur.  ?Abdomen:  ?Not distended, soft, non-tender. No rebound or rigidity.   ?DM foot exam: ?No edema, good pedal pulses, pinprick examination normal ?Skin: Exposed areas without rash. Not pale. Not jaundice ?Neurologic:  ?alert & oriented X3.  ?Speech normal, gait appropriate for age and unassisted ?Strength symmetric and appropriate for age.  ?Psych: ?Cognition and judgment appear intact.  ?Cooperative with normal attention span and concentration.  ?Behavior appropriate. ?No anxious or depressed appearing. ? ?   ?Assessment   ? ? Assessment ?DM  A1c 6.5 (05-2016)  ?Obesity ?HTN ?Hyperlipidemia ?Coronary calcifications: Per CT, 01/2020 (Rx CV RF) ?Low testosterone: Saw endocrinology 06/2014 , mild hypogonadotropic hypogonadism. no rx recommended  ?Decreased libido , ED: not interested in Rx ?OSA -- on CPAP , rx Dr Elsworth Soho ?GERD ( dx based on response to cough to PPIs) ?Decreased L carotid pulse? Normal carotid ultrasound 2008 ?[place] 2016- sees derm q year Dr Ronnald Ramp as off 05-2021 ?Glaucoma    ?BPH ?Chest 01/2020: Bronchiectasis.  Scattered solid pulmonary nodules stable.  Three-vessel CAD.  Hepatic steatosis.  Ectatic ascending thoracic aorta unchanged ? ?PLAN: ?Here for CPX ?DM: Last A1c very good, on metformin, has a healthy lifestyle. ?HTN: Ambulatory BPs normal.  Continue amlodipine, metoprolol, Micardis.  Checking labs ?Hyperlipidemia: On Tricor, based on last FLP Lipitor dose increased to 40.  Good compliance.  Check FLP. ?OSA bronchiectasis, pulmonary nodules: Good CPAP compliance.  No respiratory symptoms ?GERD: Sporadic symptoms, on PPIs as needed ?CAD per CT:  No symptoms, plan is to control CV RF, continue aspirin. ?Umbilical hernia: Patient reports she is a still there, not worse, no symptoms.  Red flags reviewed. ?RTC 6 months ? ? ?This visit occurred during the SARS-CoV-2 public health emergency.  Safety protocols were in place, including screening questions prior to the visit, additional usage of staff PPE, and extensive cleaning of exam room while observing appropriate contact time as indicated for disinfecting solutions.  ? ?

## 2021-05-20 NOTE — Assessment & Plan Note (Signed)
-  Td 2021 ?- pnm 23: 2012, 05/2020; Prevnar 2016 ?- Zostavax 02-2008 ?-shingrex  X 2 per pt  ?- COVID vaccine utd ?--CCS ?Colonoscopy:  01/09/2002, normal ?Colonoscopy 04/2011, 2 polyps,  ?Cscope 02-2017, 5 years  ?--Prostate cancer screening:  DRE   PSA wnl 01-2020,   further screening d/w pt, we agreed to stop ?--diet-exercise discussed, doing great, walks 2 miles daily. ?--Labs: cmp, flp , cbc ?- ACP info provided  ? ?  ? ?

## 2021-05-20 NOTE — Assessment & Plan Note (Signed)
Here for CPX ?DM: Last A1c very good, on metformin, has a healthy lifestyle. ?HTN: Ambulatory BPs normal.  Continue amlodipine, metoprolol, Micardis.  Checking labs ?Hyperlipidemia: On Tricor, based on last FLP Lipitor dose increased to 40.  Good compliance.  Check FLP. ?OSA bronchiectasis, pulmonary nodules: Good CPAP compliance.  No respiratory symptoms ?GERD: Sporadic symptoms, on PPIs as needed ?CAD per CT: No symptoms, plan is to control CV RF, continue aspirin. ?Umbilical hernia: Patient reports she is a still there, not worse, no symptoms.  Red flags reviewed. ?RTC 6 months ?

## 2021-05-27 ENCOUNTER — Encounter: Payer: Self-pay | Admitting: Internal Medicine

## 2021-05-27 MED ORDER — FENOFIBRATE 145 MG PO TABS: 145.0000 mg | ORAL_TABLET | Freq: Every day | ORAL | 1 refills | Status: DC

## 2021-06-14 ENCOUNTER — Other Ambulatory Visit: Payer: Self-pay | Admitting: Internal Medicine

## 2021-06-26 ENCOUNTER — Other Ambulatory Visit: Payer: Self-pay | Admitting: Internal Medicine

## 2021-07-18 ENCOUNTER — Ambulatory Visit: Payer: Medicare HMO | Attending: Internal Medicine

## 2021-07-18 DIAGNOSIS — Z23 Encounter for immunization: Secondary | ICD-10-CM

## 2021-07-22 ENCOUNTER — Encounter: Payer: Self-pay | Admitting: Adult Health

## 2021-07-22 ENCOUNTER — Ambulatory Visit (INDEPENDENT_AMBULATORY_CARE_PROVIDER_SITE_OTHER): Payer: Medicare HMO | Admitting: Adult Health

## 2021-07-22 DIAGNOSIS — G4733 Obstructive sleep apnea (adult) (pediatric): Secondary | ICD-10-CM

## 2021-07-22 DIAGNOSIS — J479 Bronchiectasis, uncomplicated: Secondary | ICD-10-CM | POA: Diagnosis not present

## 2021-07-22 DIAGNOSIS — K219 Gastro-esophageal reflux disease without esophagitis: Secondary | ICD-10-CM | POA: Diagnosis not present

## 2021-07-22 DIAGNOSIS — J310 Chronic rhinitis: Secondary | ICD-10-CM

## 2021-07-22 NOTE — Assessment & Plan Note (Signed)
Excellent control on CPAP.  Patient is continue on current settings.  CPAP care was discussed  Plan  Patient Instructions  Continue on CPAP At bedtime  .  Work on healthy weight .  Do not drive if sleepy .  GERD diet  Protonix daily as needed for reflux .  Continue on Claritin , Astelin and Flonase daily As needed   Mucinex DM As needed  Cough/congestion .  Follow up with Dr. Elsworth Soho  In 1 year  and As needed

## 2021-07-22 NOTE — Assessment & Plan Note (Signed)
Healthy weight loss discussed 

## 2021-07-22 NOTE — Assessment & Plan Note (Signed)
Patient has no significant symptoms.  Control for triggers with chronic rhinitis and GERD.  Plan  Patient Instructions  Continue on CPAP At bedtime  .  Work on healthy weight .  Do not drive if sleepy .  GERD diet  Protonix daily as needed for reflux .  Continue on Claritin , Astelin and Flonase daily As needed   Mucinex DM As needed  Cough/congestion .  Follow up with Dr. Elsworth Soho  In 1 year  and As needed

## 2021-07-22 NOTE — Assessment & Plan Note (Signed)
GERD diet and continue on current regimen

## 2021-07-22 NOTE — Progress Notes (Signed)
$'@Patient'J$  ID: Jeffery Cline, male    DOB: [birth year], 76 y.o.   MRN: [id number]  Chief Complaint  Patient presents with   Follow-up    Referring provider: Colon Branch, MD  HPI: 76 year old male never smoker followed for obstructive sleep apnea, bronchiectasis, lung nodules  TEST/EVENTS :  HST 07/2014 showed moderate OSA with AHI 27/hour and lowest desaturation of 79%. Total sleep time was 8 hours.   CT chest January 11, 2019 clustered tree-in-bud nodularity right upper lobe, scattered tiny bilateral pulmonary nodules measuring up to 5 mm.   CT chest done January 22, 2020 showed tree-in-bud opacities in the right upper lobe mildly increased with associated bronchiectasis.  Stable scattered pulmonary nodules measuring up to 5 mm consistent with benign etiology   07/22/2021 Follow up ; OSA, bronchiectasis, lung nodules Patient returns for 1 year follow-up.  Patient has underlying obstructive sleep apnea.  Patient says he is doing very well on CPAP.  He says he sleeps with his CPAP every night cannot sleep without it.  Typically gets in about 8 hours of sleep.  CPAP download shows excellent compliance with 100% usage.  Daily average usage at 8.5 hours.  Patient is on CPAP 8 cm H2O.  AHI 0.7.  Minimum leaks. Says he sleeps very well.   Previous CT chest has shown some scattered pulmonary nodules measuring up to 5 mm and some tree-in-bud opacities with associated bronchiectasis.  He denies any cough.  Is not been on any antibiotics.  Denies any unintentional weight loss.  No hemoptysis.  He is a lifelong never smoker.  Patient has intermittent allergies.  Remains on Claritin, Astelin, Flonase as needed.  Denies any flare of symptoms. Remains active, walks 2 miles everyday.   Patient has occasional reflux.  Says has been under good control.  Has Protonix to use as needed. Rarely uses.    No Known Allergies  Immunization History  Administered Date(s) Administered   Influenza Split  11/02/2013   Influenza Whole 11/23/2007, 10/09/2009, 11/09/2012   Influenza, High Dose Seasonal PF 11/18/2016, 09/26/2018, 11/22/2019, 11/14/2020   Influenza,inj,Quad PF,6+ Mos 12/06/2014   Influenza-Unspecified 11/15/2015, 11/26/2017   Moderna Covid-19 Vaccine Bivalent Booster 69yr & up 07/18/2021   Moderna Sars-Covid-2 Vaccination 03/06/2019, 04/03/2019, 12/19/2019, 05/20/2020   Pfizer Covid-19 Vaccine Bivalent Booster 165yr& up 10/30/2020   Pneumococcal Conjugate-13 04/02/2014   Pneumococcal Polysaccharide-23 01/20/2011, 06/07/2020   Td 06/07/1998, 09/18/2008   Tdap 02/15/2019   Zoster Recombinat (Shingrix) 02/15/2019   Zoster, Live 02/21/2008    Past Medical History:  Diagnosis Date   Allergic rhinitis    Allergy    Basal cell carcinoma 2016   Right medial posterior shoulder, shave and ED&C   Cataract    forming    Decreased carotid pulse    decreased L carotid pulse (?) : Nl carotidu/s 02-2006     ED (erectile dysfunction)    GERD (gastroesophageal reflux disease)    Glaucoma    Hyperlipidemia    Hypertension    SCC (squamous cell carcinoma)    Sleep apnea    on CPAP    Tobacco History: Social History   Tobacco Use  Smoking Status Never  Smokeless Tobacco Never  Tobacco Comments   Socially in college.   Counseling given: Not Answered Tobacco comments: Socially in college.   Outpatient Medications Prior to Visit  Medication Sig Dispense Refill   albuterol (VENTOLIN HFA) 108 (90 Base) MCG/ACT inhaler TAKE 2 PUFFS BY MOUTH EVERY 6 HOURS  AS NEEDED FOR WHEEZE OR SHORTNESS OF BREATH 6.7 each 1   amLODipine (NORVASC) 5 MG tablet TAKE 1 TABLET (5 MG TOTAL) BY MOUTH DAILY. 90 tablet 1   aspirin EC 81 MG tablet Take 81 mg by mouth daily. Swallow whole.     atorvastatin (LIPITOR) 40 MG tablet TAKE 1 TABLET BY MOUTH EVERYDAY AT BEDTIME 90 tablet 2   COMBIGAN 0.2-0.5 % ophthalmic solution INSTILL 1 DROP IN BOTH EYES TWICE A DAY  3   fenofibrate (TRICOR) 145 MG tablet  Take 1 tablet (145 mg total) by mouth daily. 90 tablet 1   metFORMIN (GLUCOPHAGE) 1000 MG tablet TAKE 1 TABLET (1,000 MG TOTAL) BY MOUTH TWICE A DAY WITH FOOD 180 tablet 1   metoprolol tartrate (LOPRESSOR) 50 MG tablet TAKE 1 TABLET BY MOUTH TWICE A DAY 180 tablet 1   pantoprazole (PROTONIX) 40 MG tablet Take 40 mg by mouth daily before breakfast.     telmisartan (MICARDIS) 80 MG tablet TAKE 1 TABLET BY MOUTH EVERY DAY 90 tablet 1   No facility-administered medications prior to visit.     Review of Systems:   Constitutional:   No  weight loss, night sweats,  Fevers, chills, fatigue, or  lassitude.  HEENT:   No headaches,  Difficulty swallowing,  Tooth/dental problems, or  Sore throat,                No sneezing, itching, ear ache, nasal congestion, post nasal drip,   CV:  No chest pain,  Orthopnea, PND, swelling in lower extremities, anasarca, dizziness, palpitations, syncope.   GI  No heartburn, indigestion, abdominal pain, nausea, vomiting, diarrhea, change in bowel habits, loss of appetite, bloody stools.   Resp: No shortness of breath with exertion or at rest.  No excess mucus, no productive cough,  No non-productive cough,  No coughing up of blood.  No change in color of mucus.  No wheezing.  No chest wall deformity  Skin: no rash or lesions.  GU: no dysuria, change in color of urine, no urgency or frequency.  No flank pain, no hematuria   MS:  No joint pain or swelling.  No decreased range of motion.  No back pain.    Physical Exam  BP 128/76 (BP Location: Left Arm, Patient Position: Sitting, Cuff Size: Normal)   Pulse (!) 56   Ht '5\' 9"'$  (1.753 m)   Wt 231 lb 12.8 oz (105.1 kg)   SpO2 96% Comment: RA  BMI 34.23 kg/m   GEN: A/Ox3; pleasant , NAD, well nourished    HEENT:  [place]/AT,   NOSE-clear, THROAT-clear, no lesions, no postnasal drip or exudate noted.  Class 2-3 MP airway   NECK:  Supple w/ fair ROM; no JVD; normal carotid impulses w/o bruits; no thyromegaly or  nodules palpated; no lymphadenopathy.    RESP  Clear  P & A; w/o, wheezes/ rales/ or rhonchi. no accessory muscle use, no dullness to percussion  CARD:  RRR, no m/r/g, no peripheral edema, pulses intact, no cyanosis or clubbing.  GI:   Soft & nt; nml bowel sounds; no organomegaly or masses detected.   Musco: Warm bil, no deformities or joint swelling noted.   Neuro: alert, no focal deficits noted.    Skin: Warm, no lesions or rashes    Lab Results:     BNP No results found for: "BNP"  ProBNP No results found for: "PROBNP"  Imaging: No results found.        No  data to display          No results found for: "NITRICOXIDE"      Assessment & Plan:   OSA (obstructive sleep apnea) Excellent control on CPAP.  Patient is continue on current settings.  CPAP care was discussed  Plan  Patient Instructions  Continue on CPAP At bedtime  .  Work on healthy weight .  Do not drive if sleepy .  GERD diet  Protonix daily as needed for reflux .  Continue on Claritin , Astelin and Flonase daily As needed   Mucinex DM As needed  Cough/congestion .  Follow up with Dr. Elsworth Soho  In 1 year  and As needed       Morbid obesity ([place]) Healthy weight loss discussed  Bronchiectasis without complication [facility]) Patient has no significant symptoms.  Control for triggers with chronic rhinitis and GERD.  Plan  Patient Instructions  Continue on CPAP At bedtime  .  Work on healthy weight .  Do not drive if sleepy .  GERD diet  Protonix daily as needed for reflux .  Continue on Claritin , Astelin and Flonase daily As needed   Mucinex DM As needed  Cough/congestion .  Follow up with Dr. Elsworth Soho  In 1 year  and As needed       GERD (gastroesophageal reflux disease) GERD diet and continue on current regimen  Chronic rhinitis Controlled on current settings  Continue current regimen     Rexene Edison, NP 07/22/2021

## 2021-07-22 NOTE — Assessment & Plan Note (Signed)
Controlled on current settings  Continue current regimen

## 2021-07-22 NOTE — Patient Instructions (Addendum)
Continue on CPAP At bedtime  .  Work on healthy weight .  Do not drive if sleepy .  GERD diet  Protonix daily as needed for reflux .  Continue on Claritin , Astelin and Flonase daily As needed   Mucinex DM As needed  Cough/congestion .  Follow up with Dr. Elsworth Soho  In 1 year  and As needed

## 2021-07-24 ENCOUNTER — Other Ambulatory Visit (HOSPITAL_BASED_OUTPATIENT_CLINIC_OR_DEPARTMENT_OTHER): Payer: Self-pay

## 2021-07-24 MED ORDER — MODERNA COVID-19 BIVAL BOOSTER 50 MCG/0.5ML IM SUSP
INTRAMUSCULAR | 0 refills | Status: DC
Start: 2021-07-18 — End: 2021-11-18
  Filled 2021-07-24: qty 0.5, 1d supply, fill #0

## 2021-08-07 NOTE — Progress Notes (Signed)
   Covid-19 Vaccination Clinic  Name:  TYMEER VAQUERA    MRN: [id number] DOB: 1945-10-19  08/07/2021  Mr. Gurney was observed post Covid-19 immunization for 15 minutes without incident. He was provided with Vaccine Information Sheet and instruction to access the V-Safe system.   Mr. Harriman was instructed to call 911 with any severe reactions post vaccine: Difficulty breathing  Swelling of face and throat  A fast heartbeat  A bad rash all over body  Dizziness and weakness   Immunizations Administered     Name Date Dose VIS Date Route   Moderna Covid-19 vaccine Bivalent Booster 07/18/2021 10:30 AM 0.5 mL 09/21/2020 Intramuscular   Manufacturer: Levan Hurst   Lot: [id number]   [place]: [phone]

## 2021-08-12 ENCOUNTER — Encounter: Payer: Self-pay | Admitting: Emergency Medicine

## 2021-08-12 ENCOUNTER — Ambulatory Visit
Admission: EM | Admit: 2021-08-12 | Discharge: 2021-08-12 | Disposition: A | Discharge status: Home / Self Care | Payer: Medicare HMO | Attending: Urgent Care | Admitting: Urgent Care

## 2021-08-12 DIAGNOSIS — L729 Follicular cyst of the skin and subcutaneous tissue, unspecified: Secondary | ICD-10-CM | POA: Diagnosis not present

## 2021-08-12 DIAGNOSIS — L089 Local infection of the skin and subcutaneous tissue, unspecified: Secondary | ICD-10-CM | POA: Diagnosis not present

## 2021-08-12 MED ORDER — CLINDAMYCIN HCL 300 MG PO CAPS: 300.0000 mg | ORAL_CAPSULE | Freq: Three times a day (TID) | ORAL | 0 refills | Status: DC

## 2021-08-12 NOTE — ED Triage Notes (Signed)
Pt here with recurrent abscess to the back of left shoulder x 1 year. Pt tried to take left over doxycycline. Pt states pain and not draining.

## 2021-08-12 NOTE — Discharge Instructions (Signed)
Please change your dressing 3-5 times daily. Do not apply any ointments or creams. Each time you change your dressing, make sure that you are pressing on the wound to get pus to come out.  Try your best to have a family member help you clean the wound with gentle soap and warm water. Pat your wound dry and let it air out if possible to make sure it is dry before reapplying another dressing.   Start clindamycin for the infection. Use Tylenol for the pain.

## 2021-08-12 NOTE — ED Provider Notes (Signed)
[place]   MRN: [id number] DOB: 04/22/1945  Subjective:   Jeffery Cline is a 76 y.o. male presenting for 1 week history of an infected cyst.  Area is red, tender.  It has not drained.  He has gotten cysts as long as he can remember.  He had leftover doxycycline which she started and took for 6 days but is not improving.  No current facility-administered medications for this encounter.  Current Outpatient Medications:    albuterol (VENTOLIN HFA) 108 (90 Base) MCG/ACT inhaler, TAKE 2 PUFFS BY MOUTH EVERY 6 HOURS AS NEEDED FOR WHEEZE OR SHORTNESS OF BREATH, Disp: 6.7 each, Rfl: 1   amLODipine (NORVASC) 5 MG tablet, TAKE 1 TABLET (5 MG TOTAL) BY MOUTH DAILY., Disp: 90 tablet, Rfl: 1   aspirin EC 81 MG tablet, Take 81 mg by mouth daily. Swallow whole., Disp: , Rfl:    atorvastatin (LIPITOR) 40 MG tablet, TAKE 1 TABLET BY MOUTH EVERYDAY AT BEDTIME, Disp: 90 tablet, Rfl: 2   COMBIGAN 0.2-0.5 % ophthalmic solution, INSTILL 1 DROP IN BOTH EYES TWICE A DAY, Disp: , Rfl: 3   COVID-19 mRNA bivalent vaccine, Moderna, (MODERNA COVID-19 BIVAL BOOSTER) 50 MCG/0.5ML injection, Inject into the muscle., Disp: 0.5 mL, Rfl: 0   fenofibrate (TRICOR) 145 MG tablet, Take 1 tablet (145 mg total) by mouth daily., Disp: 90 tablet, Rfl: 1   metFORMIN (GLUCOPHAGE) 1000 MG tablet, TAKE 1 TABLET (1,000 MG TOTAL) BY MOUTH TWICE A DAY WITH FOOD, Disp: 180 tablet, Rfl: 1   metoprolol tartrate (LOPRESSOR) 50 MG tablet, TAKE 1 TABLET BY MOUTH TWICE A DAY, Disp: 180 tablet, Rfl: 1   pantoprazole (PROTONIX) 40 MG tablet, Take 40 mg by mouth daily before breakfast., Disp: , Rfl:    telmisartan (MICARDIS) 80 MG tablet, TAKE 1 TABLET BY MOUTH EVERY DAY, Disp: 90 tablet, Rfl: 1   No Known Allergies  Past Medical History:  Diagnosis Date   Allergic rhinitis    Allergy    Basal cell carcinoma 2016   Right medial posterior shoulder, shave and ED&C   Cataract    forming    Decreased carotid  pulse    decreased L carotid pulse (?) : Nl carotidu/s 02-2006     ED (erectile dysfunction)    GERD (gastroesophageal reflux disease)    Glaucoma    Hyperlipidemia    Hypertension    SCC (squamous cell carcinoma)    Sleep apnea    on CPAP     Past Surgical History:  Procedure Laterality Date   CYSTECTOMY     back, recurrent, last 01-2016   INGUINAL HERNIA REPAIR     right by dr. Ninfa Linden   MOHS SURGERY Left 11/2018   face, betwen nose-ear    PILONIDAL CYST EXCISION     POLYPECTOMY     TONSILLECTOMY      Family History  Problem Relation Age of Onset   Dementia Mother    Alcohol abuse Mother    Coronary artery disease Father 69   Coronary artery disease Maternal Grandfather    Leukemia Paternal Grandfather    Colon cancer Neg Hx    Prostate cancer Neg Hx    Colon polyps Neg Hx    Rectal cancer Neg Hx    Stomach cancer Neg Hx     Social History   Tobacco Use   Smoking status: Never   Smokeless tobacco: Never   Tobacco comments:    Socially in college.  Substance Use Topics   Alcohol use: Not Currently    Comment:     Drug use: No    ROS   Objective:   Vitals: BP (!) 158/90   Pulse (!) 54   Temp 97.8 F (36.6 C)   Resp 20   SpO2 98%   Physical Exam Constitutional:      General: He is not in acute distress.    Appearance: Normal appearance. He is well-developed and normal weight. He is not ill-appearing, toxic-appearing or diaphoretic.  HENT:     Head: Normocephalic and atraumatic.     Right Ear: External ear normal.     Left Ear: External ear normal.     Nose: Nose normal.     Mouth/Throat:     Pharynx: Oropharynx is clear.  Eyes:     General: No scleral icterus.       Right eye: No discharge.        Left eye: No discharge.     Extraocular Movements: Extraocular movements intact.  Cardiovascular:     Rate and Rhythm: Normal rate.  Pulmonary:     Effort: Pulmonary effort is normal.  Musculoskeletal:     Cervical back: Normal range of  motion.  Skin:      Neurological:     Mental Status: He is alert and oriented to person, place, and time.  Psychiatric:        Mood and Affect: Mood normal.        Behavior: Behavior normal.        Thought Content: Thought content normal.        Judgment: Judgment normal.     PROCEDURE NOTE: I&D of Abscess Verbal consent obtained. Local anesthesia with 5cc of 2% lidocaine with epinephrine. Site cleansed with alcohol swab. Incision of 1cm was made using an 11 blade, 4cc expressed consisting of a mixture of pus, sebaceous cyst material and serosanguinous fluid. Wound cavity was explored with curved hemostats and loculations loosened. Cleansed and dressed.   Assessment and Plan :   PDMP not reviewed this encounter.  1. Infected cyst of skin    Successful incision and drainage.  Start clindamycin.  Use Tylenol for pain relief.  Follow-up with dermatology. Counseled patient on potential for adverse effects with medications prescribed/recommended today, ER and return-to-clinic precautions discussed, patient verbalized understanding.    Jaynee Eagles, [place] 08/12/21 [phone]

## 2021-09-05 ENCOUNTER — Encounter: Payer: Self-pay | Admitting: Internal Medicine

## 2021-10-07 DIAGNOSIS — G4733 Obstructive sleep apnea (adult) (pediatric): Secondary | ICD-10-CM

## 2021-10-08 NOTE — Telephone Encounter (Signed)
"  Good morning, Jeffery Cline. I have been seeing you on an annual basis for CPAP therapy check- ups and recertification. My CPAP machine is now giving a message "motor life exceeded, contact your provider." It is still working fine at present and i use it religiously. Can your office help me with this or do I contact [facility] directly? My current machine is about 76 years old or so. I await your advices.  As usual, many thanks!"  Patient is agreeable to getting a new cpap machine   Jeffery Cline [place] please advise, are you okay with [place] placing this order? Thanks!

## 2021-10-08 NOTE — Telephone Encounter (Signed)
Definitely place order for new CPAP machine.  Continue with same settings.  He has excellent control and compliance recently seen in the office  CPAP download shows excellent compliance with 100% usage.  Daily average usage at 8.5 hours.  Patient is on CPAP 8 cm H2O.  AHI 0.7.  Minimum leaks

## 2021-10-11 ENCOUNTER — Other Ambulatory Visit: Payer: Self-pay | Admitting: Internal Medicine

## 2021-11-02 ENCOUNTER — Other Ambulatory Visit: Payer: Self-pay | Admitting: Internal Medicine

## 2021-11-17 ENCOUNTER — Ambulatory Visit (INDEPENDENT_AMBULATORY_CARE_PROVIDER_SITE_OTHER): Payer: Medicare HMO | Admitting: *Deleted

## 2021-11-17 VITALS — BP 131/74 | HR 58 | Ht 69.0 in | Wt 231.0 lb

## 2021-11-17 DIAGNOSIS — Z0000 Encounter for general adult medical examination without abnormal findings: Secondary | ICD-10-CM

## 2021-11-17 DIAGNOSIS — Z23 Encounter for immunization: Secondary | ICD-10-CM

## 2021-11-17 NOTE — Patient Instructions (Signed)
[name] , Thank you for taking time to come for your Medicare Wellness Visit. I appreciate your ongoing commitment to your health goals. Please review the following plan we discussed and let me know if I can assist you in the future.   These are the goals we discussed:  Goals       Drink at least 4 glasses of water per day. (pt-stated)      Weight (lb) < 220 lb (99.8 kg)        This is a list of the screening recommended for you and due dates:  Health Maintenance  Topic Date Due   Yearly kidney health urinalysis for diabetes  Never done   Hemoglobin A1C  06/15/2021   COVID-19 Vaccine (7 - Moderna risk series) 09/12/2021   Eye exam for diabetics  10/10/2021   Colon Cancer Screening  02/24/2022   Yearly kidney function blood test for diabetes  05/21/2022   Complete foot exam   05/21/2022   Tetanus Vaccine  02/14/2029   Pneumonia Vaccine  Completed   Flu Shot  Completed   Hepatitis C Screening: USPSTF Recommendation to screen - Ages 18-79 yo.  Completed   Zoster (Shingles) Vaccine  Completed   HPV Vaccine  Aged Out     Next appointment: Follow up in one year for your annual wellness visit.   Preventive Care 42 Years and Older, Male Preventive care refers to lifestyle choices and visits with your health care provider that can promote health and wellness. What does preventive care include? A yearly physical exam. This is also called an annual well check. Dental exams once or twice a year. Routine eye exams. Ask your health care provider how often you should have your eyes checked. Personal lifestyle choices, including: Daily care of your teeth and gums. Regular physical activity. Eating a healthy diet. Avoiding tobacco and drug use. Limiting alcohol use. Practicing safe sex. Taking low doses of aspirin every day. Taking vitamin and mineral supplements as recommended by your health care provider. What happens during an annual well check? The services and screenings  done by your health care provider during your annual well check will depend on your age, overall health, lifestyle risk factors, and family history of disease. Counseling  Your health care provider may ask you questions about your: Alcohol use. Tobacco use. Drug use. Emotional well-being. Home and relationship well-being. Sexual activity. Eating habits. History of falls. Memory and ability to understand (cognition). Work and work Statistician. Screening  You may have the following tests or measurements: Height, weight, and BMI. Blood pressure. Lipid and cholesterol levels. These may be checked every 5 years, or more frequently if you are over 15 years old. Skin check. Lung cancer screening. You may have this screening every year starting at age 4 if you have a 30-pack-year history of smoking and currently smoke or have quit within the past 15 years. Fecal occult blood test (FOBT) of the stool. You may have this test every year starting at age 13. Flexible sigmoidoscopy or colonoscopy. You may have a sigmoidoscopy every 5 years or a colonoscopy every 10 years starting at age 27. Prostate cancer screening. Recommendations will vary depending on your family history and other risks. Hepatitis C blood test. Hepatitis B blood test. Sexually transmitted disease (STD) testing. Diabetes screening. This is done by checking your blood sugar (glucose) after you have not eaten for a while (fasting). You may have this done every 1-3 years. Abdominal aortic aneurysm (AAA) screening.  You may need this if you are a current or former smoker. Osteoporosis. You may be screened starting at age 98 if you are at high risk. Talk with your health care provider about your test results, treatment options, and if necessary, the need for more tests. Vaccines  Your health care provider may recommend certain vaccines, such as: Influenza vaccine. This is recommended every year. Tetanus, diphtheria, and acellular  pertussis (Tdap, Td) vaccine. You may need a Td booster every 10 years. Zoster vaccine. You may need this after age 42. Pneumococcal 13-valent conjugate (PCV13) vaccine. One dose is recommended after age 82. Pneumococcal polysaccharide (PPSV23) vaccine. One dose is recommended after age 75. Talk to your health care provider about which screenings and vaccines you need and how often you need them. This information is not intended to replace advice given to you by your health care provider. Make sure you discuss any questions you have with your health care provider. Document Released: 02/22/2015 Document Revised: 10/16/2015 Document Reviewed: 11/27/2014 Elsevier Interactive Patient Education  2017 [place] Prevention in the Home Falls can cause injuries. They can happen to people of all ages. There are many things you can do to make your home safe and to help prevent falls. What can I do on the outside of my home? Regularly fix the edges of walkways and driveways and fix any cracks. Remove anything that might make you trip as you walk through a door, such as a raised step or threshold. Trim any bushes or trees on the path to your home. Use bright outdoor lighting. Clear any walking paths of anything that might make someone trip, such as rocks or tools. Regularly check to see if handrails are loose or broken. Make sure that both sides of any steps have handrails. Any raised decks and porches should have guardrails on the edges. Have any leaves, snow, or ice cleared regularly. Use sand or salt on walking paths during winter. Clean up any spills in your garage right away. This includes oil or grease spills. What can I do in the bathroom? Use night lights. Install grab bars by the toilet and in the tub and shower. Do not use towel bars as grab bars. Use non-skid mats or decals in the tub or shower. If you need to sit down in the shower, use a plastic, non-slip stool. Keep the floor  dry. Clean up any water that spills on the floor as soon as it happens. Remove soap buildup in the tub or shower regularly. Attach bath mats securely with double-sided non-slip rug tape. Do not have throw rugs and other things on the floor that can make you trip. What can I do in the bedroom? Use night lights. Make sure that you have a light by your bed that is easy to reach. Do not use any sheets or blankets that are too big for your bed. They should not hang down onto the floor. Have a firm chair that has side arms. You can use this for support while you get dressed. Do not have throw rugs and other things on the floor that can make you trip. What can I do in the kitchen? Clean up any spills right away. Avoid walking on wet floors. Keep items that you use a lot in easy-to-reach places. If you need to reach something above you, use a strong step stool that has a grab bar. Keep electrical cords out of the way. Do not use floor polish or wax  that makes floors slippery. If you must use wax, use non-skid floor wax. Do not have throw rugs and other things on the floor that can make you trip. What can I do with my stairs? Do not leave any items on the stairs. Make sure that there are handrails on both sides of the stairs and use them. Fix handrails that are broken or loose. Make sure that handrails are as long as the stairways. Check any carpeting to make sure that it is firmly attached to the stairs. Fix any carpet that is loose or worn. Avoid having throw rugs at the top or bottom of the stairs. If you do have throw rugs, attach them to the floor with carpet tape. Make sure that you have a light switch at the top of the stairs and the bottom of the stairs. If you do not have them, ask someone to add them for you. What else can I do to help prevent falls? Wear shoes that: Do not have high heels. Have rubber bottoms. Are comfortable and fit you well. Are closed at the toe. Do not wear  sandals. If you use a stepladder: Make sure that it is fully opened. Do not climb a closed stepladder. Make sure that both sides of the stepladder are locked into place. Ask someone to hold it for you, if possible. Clearly mark and make sure that you can see: Any grab bars or handrails. First and last steps. Where the edge of each step is. Use tools that help you move around (mobility aids) if they are needed. These include: Canes. Walkers. Scooters. Crutches. Turn on the lights when you go into a dark area. Replace any light bulbs as soon as they burn out. Set up your furniture so you have a clear path. Avoid moving your furniture around. If any of your floors are uneven, fix them. If there are any pets around you, be aware of where they are. Review your medicines with your doctor. Some medicines can make you feel dizzy. This can increase your chance of falling. Ask your doctor what other things that you can do to help prevent falls. This information is not intended to replace advice given to you by your health care provider. Make sure you discuss any questions you have with your health care provider. Document Released: 11/22/2008 Document Revised: 07/04/2015 Document Reviewed: 03/02/2014 Elsevier Interactive Patient Education  2017 [employer].

## 2021-11-17 NOTE — Progress Notes (Addendum)
Subjective:   Jeffery Cline is a 76 y.o. male who presents for Medicare Annual/Subsequent preventive examination.  Review of Systems    Defer to PCP Cardiac Risk Factors include: advanced age (>47mn, >>53women);diabetes mellitus;dyslipidemia;male gender;hypertension     Objective:    Today's Vitals   11/17/21 0820  BP: 131/74  Pulse: (!) 58  Weight: 231 lb (104.8 kg)  Height: '5\' 9"'$  (1.753 m)   Body mass index is 34.11 kg/m.     11/17/2021    8:27 AM 11/11/2020    8:18 AM 12/03/2018   11:01 AM 02/10/2017    8:56 AM 05/19/2016   10:30 AM 02/28/2015    3:38 PM 12/18/2014    7:46 PM  Advanced Directives  Does Patient Have a Medical Advance Directive? Yes Yes No Yes Yes Yes Yes  Type of AParamedicof ALyonsLiving will HSt. JohnsLiving will  HWood VillageLiving will HMayesLiving will HZuni PuebloLiving will HLas LomasLiving will  Does patient want to make changes to medical advance directive? No - Patient declined      No - Patient declined  Copy of HJennings Lodgein Chart? No - copy requested No - copy requested   No - copy requested  No - copy requested  Would patient like information on creating a medical advance directive?   No - Patient declined        Current Medications (verified) Outpatient Encounter Medications as of 11/17/2021  Medication Sig   amLODipine (NORVASC) 5 MG tablet Take 1 tablet (5 mg total) by mouth daily.   aspirin EC 81 MG tablet Take 81 mg by mouth daily. Swallow whole.   atorvastatin (LIPITOR) 40 MG tablet TAKE 1 TABLET BY MOUTH EVERYDAY AT BEDTIME   COMBIGAN 0.2-0.5 % ophthalmic solution INSTILL 1 DROP IN BOTH EYES TWICE A DAY   COVID-19 mRNA bivalent vaccine, Moderna, (MODERNA COVID-19 BIVAL BOOSTER) 50 MCG/0.5ML injection Inject into the muscle.   fenofibrate (TRICOR) 145 MG tablet TAKE 1 TABLET BY MOUTH EVERY DAY    metFORMIN (GLUCOPHAGE) 1000 MG tablet TAKE 1 TABLET (1,000 MG TOTAL) BY MOUTH TWICE A DAY WITH FOOD   metoprolol tartrate (LOPRESSOR) 50 MG tablet Take 1 tablet (50 mg total) by mouth 2 (two) times daily.   pantoprazole (PROTONIX) 40 MG tablet Take 40 mg by mouth daily before breakfast.   telmisartan (MICARDIS) 80 MG tablet Take 1 tablet (80 mg total) by mouth daily.   [DISCONTINUED] albuterol (VENTOLIN HFA) 108 (90 Base) MCG/ACT inhaler TAKE 2 PUFFS BY MOUTH EVERY 6 HOURS AS NEEDED FOR WHEEZE OR SHORTNESS OF BREATH   [DISCONTINUED] clindamycin (CLEOCIN) 300 MG capsule Take 1 capsule (300 mg total) by mouth 3 (three) times daily.   No facility-administered encounter medications on file as of 11/17/2021.    Allergies (verified) Patient has no known allergies.   History: Past Medical History:  Diagnosis Date   Allergic rhinitis    Allergy    Basal cell carcinoma 2016   Right medial posterior shoulder, shave and ED&C   Cataract    forming    Decreased carotid pulse    decreased L carotid pulse (?) : Nl carotidu/s 02-2006     ED (erectile dysfunction)    GERD (gastroesophageal reflux disease)    Glaucoma    Hyperlipidemia    Hypertension    SCC (squamous cell carcinoma)    Sleep apnea  on CPAP   Past Surgical History:  Procedure Laterality Date   CYSTECTOMY     back, recurrent, last 01-2016   INGUINAL HERNIA REPAIR     right by dr. Ninfa Linden   MOHS SURGERY Left 11/2018   face, betwen nose-ear    PILONIDAL CYST EXCISION     POLYPECTOMY     TONSILLECTOMY     Family History  Problem Relation Age of Onset   Dementia Mother    Alcohol abuse Mother    Coronary artery disease Father 55   Coronary artery disease Maternal Grandfather    Leukemia Paternal Grandfather    Colon cancer Neg Hx    Prostate cancer Neg Hx    Colon polyps Neg Hx    Rectal cancer Neg Hx    Stomach cancer Neg Hx    Social History   Socioeconomic History   Marital status: Married    Spouse  name: Not on file   Number of children: 2   Years of education: Not on file   Highest education level: Not on file  Occupational History   Occupation: retired  Tobacco Use   Smoking status: Never   Smokeless tobacco: Never   Tobacco comments:    Socially in college.  Substance and Sexual Activity   Alcohol use: Not Currently    Comment:     Drug use: No   Sexual activity: Not Currently  Other Topics Concern   Not on file  Social History Narrative   ** Merged History Encounter **       Married. 2 sons ([place] and [place]) , 5 grandkids     Social Determinants of Health   Financial Resource Strain: Low Risk  (11/11/2020)   Overall Financial Resource Strain (CARDIA)    Difficulty of Paying Living Expenses: Not hard at all  Food Insecurity: No Food Insecurity (11/11/2020)   Hunger Vital Sign    Worried About Running Out of Food in the Last Year: Never true    Ran Out of Food in the Last Year: Never true  Transportation Needs: No Transportation Needs (11/11/2020)   PRAPARE - Hydrologist (Medical): No    Lack of Transportation (Non-Medical): No  Physical Activity: Sufficiently Active (11/11/2020)   Exercise Vital Sign    Days of Exercise per Week: 7 days    Minutes of Exercise per Session: 40 min  Stress: No Stress Concern Present (11/11/2020)   [place]    Feeling of Stress : Not at all  Social Connections: [place] (11/11/2020)   Social Connection and Isolation Panel [NHANES]    Frequency of Communication with Friends and Family: More than three times a week    Frequency of Social Gatherings with Friends and Family: More than three times a week    Attends Religious Services: More than 4 times per year    Active Member of [employer] or Organizations: Yes    Attends Music therapist: More than 4 times per year    Marital Status: Married    Tobacco  Counseling Counseling given: Not Answered Tobacco comments: Socially in college.   Clinical Intake:  Pre-visit preparation completed: Yes  Pain : No/denies pain   How often do you need to have someone help you when you read instructions, pamphlets, or other written materials from your doctor or pharmacy?: 1 - Never  Diabetic? Yes Nutrition Risk Assessment:  Has the patient had any  N/V/D within the last 2 months?  No  Does the patient have any non-healing wounds?  No  Has the patient had any unintentional weight loss or weight gain?  No   Diabetes:  Is the patient diabetic?  Yes  If diabetic, was a CBG obtained today?  No  Did the patient bring in their glucometer from home?  No  How often do you monitor your CBG's? never.   Financial Strains and Diabetes Management:  Are you having any financial strains with the device, your supplies or your medication? No .  Does the patient want to be seen by Chronic Care Management for management of their diabetes?  No  Would the patient like to be referred to a Nutritionist or for Diabetic Management?  No   Diabetic Exams:  Diabetic Eye Exam: Overdue for diabetic eye exam. Pt has been advised about the importance in completing this exam. Patient advised to call and schedule an eye exam. Diabetic Foot Exam: Completed 05/20/21    Interpreter Needed?: No  Information entered by :: Beatris Ship, [place]   Activities of Daily Living    11/17/2021    8:28 AM  In your present state of health, do you have any difficulty performing the following activities:  Hearing? 0  Vision? 0  Difficulty concentrating or making decisions? 0  Walking or climbing stairs? 0  Dressing or bathing? 0  Doing errands, shopping? 0  Preparing Food and eating ? N  Using the Toilet? N  In the past six months, have you accidently leaked urine? Y  Do you have problems with loss of bowel control? N  Managing your Medications? N  Managing your Finances? N   Housekeeping or managing your Housekeeping? N    Patient Care Team: Colon Branch, MD as PCP - General Donnie Mesa, MD as Consulting Physician (General Surgery) Calvert Cantor, MD as Consulting Physician (Ophthalmology) Danella Sensing, MD as Consulting Physician (Dermatology)  Indicate any recent Medical Services you may have received from other than [facility] providers in the past year (date may be approximate).     Assessment:   This is a routine wellness examination for Jameson.  Hearing/Vision screen No results found.  Dietary issues and exercise activities discussed: Current Exercise Habits: Home exercise routine, Type of exercise: walking, Time (Minutes): 40, Frequency (Times/Week): 7, Weekly Exercise (Minutes/Week): 280, Intensity: Mild, Exercise limited by: None identified   Goals Addressed   None    Depression Screen    11/17/2021    8:27 AM 05/20/2021    9:01 AM 11/11/2020    8:22 AM 02/07/2020    8:11 AM 08/07/2019    8:33 AM 08/17/2018   10:49 AM 05/26/2017    8:06 AM  PHQ 2/9 Scores  PHQ - 2 Score 0 0 0 0 0 0 0    Fall Risk    11/17/2021    8:27 AM 05/20/2021    9:01 AM 11/11/2020    8:20 AM 02/07/2020    8:11 AM 10/25/2019    2:37 PM  [place] in the past year? 0 0 0 0 0  Number falls in past yr: 0 0 0 0 0  Injury with Fall? 0 0 0 0 0  Risk for fall due to : No Fall Risks      Follow up Falls evaluation completed Falls evaluation completed Falls prevention discussed  Falls evaluation completed    [place]:  Any stairs  in or around the home? Yes  If so, are there any without handrails? No  Home free of loose throw rugs in walkways, pet beds, electrical cords, etc? Yes  Adequate lighting in your home to reduce risk of falls? Yes   ASSISTIVE DEVICES UTILIZED TO PREVENT FALLS:  Life alert? No  Use of a cane, walker or w/c? No  Grab bars in the bathroom? Yes  Shower chair or bench in shower? No  Elevated toilet  seat or a handicapped toilet? No   TIMED UP AND GO:  Was the test performed? Yes .  Length of time to ambulate 10 feet: 6 sec.   Gait steady and fast without use of assistive device  Cognitive Function:        11/17/2021    8:32 AM  6CIT Screen  What Year? 0 points  What month? 0 points  What time? 0 points  Count back from 20 0 points  Months in reverse 0 points  Repeat phrase 0 points  Total Score 0 points    Immunizations Immunization History  Administered Date(s) Administered   Fluad Quad(high Dose 65+) 11/17/2021   Influenza Split 11/02/2013   Influenza Whole 11/23/2007, 10/09/2009, 11/09/2012   Influenza, High Dose Seasonal PF 11/18/2016, 09/26/2018, 11/22/2019, 11/14/2020   Influenza,inj,Quad PF,6+ Mos 12/06/2014   Influenza-Unspecified 11/15/2015, 11/26/2017   Moderna Covid-19 Vaccine Bivalent Booster 59yr & up 07/18/2021   Moderna Sars-Covid-2 Vaccination 03/06/2019, 04/03/2019, 12/19/2019, 05/20/2020   Pfizer Covid-19 Vaccine Bivalent Booster 14yr& up 10/30/2020   Pneumococcal Conjugate-13 04/02/2014   Pneumococcal Polysaccharide-23 01/20/2011, 06/07/2020   Td 06/07/1998, 09/18/2008   Tdap 02/15/2019   Zoster Recombinat (Shingrix) 02/15/2019, 10/15/2021   Zoster, Live 02/21/2008    TDAP status: Up to date  Flu Vaccine status: Completed at today's visit  Pneumococcal vaccine status: Up to date  Covid-19 vaccine status: Information provided on how to obtain vaccines.   Qualifies for Shingles Vaccine? Yes   Zostavax completed Yes   Shingrix Completed?: Yes  Screening Tests Health Maintenance  Topic Date Due   Diabetic kidney evaluation - Urine ACR  Never done   HEMOGLOBIN A1C  06/15/2021   COVID-19 Vaccine (7 - Moderna risk series) 09/12/2021   OPHTHALMOLOGY EXAM  10/10/2021   COLONOSCOPY (Pts [id number] coverage will need to be confirmed)  02/24/2022   Diabetic kidney evaluation - GFR measurement  05/21/2022   FOOT EXAM  05/21/2022    TETANUS/TDAP  02/14/2029   Pneumonia Vaccine 65+66ears old  Completed   INFLUENZA VACCINE  Completed   Hepatitis C Screening  Completed   Zoster Vaccines- Shingrix  Completed   HPV VACCINES  Aged Out    Health Maintenance  Health Maintenance Due  Topic Date Due   Diabetic kidney evaluation - Urine ACR  Never done   HEMOGLOBIN A1C  06/15/2021   COVID-19 Vaccine (7 - Moderna risk series) 09/12/2021   OPHTHALMOLOGY EXAM  10/10/2021    Colorectal cancer screening: Type of screening: Colonoscopy. Completed 02/24/17. Repeat every 5 years  Lung Cancer Screening: (Low Dose CT Chest recommended if Age 86-47-80ars, 30 pack-year currently smoking OR have quit w/in 15years.) does not qualify.   Lung Cancer Screening Referral: N/a  Additional Screening:  Hepatitis C Screening: does qualify; Completed 05/20/16  Vision Screening: Recommended annual ophthalmology exams for early detection of glaucoma and other disorders of the eye. Is the patient up to date with their annual eye exam?  Yes  Who is the provider or  what is the name of the office in which the patient attends annual eye exams? Dr. Eulas Post If pt is not established with a provider, would they like to be referred to a provider to establish care? No .   Dental Screening: Recommended annual dental exams for proper oral hygiene  Community Resource Referral / Chronic Care Management: CRR required this visit?  No   CCM required this visit?  No      Plan:     I have personally reviewed and noted the following in the patient's chart:   Medical and social history Use of alcohol, tobacco or illicit drugs  Current medications and supplements including opioid prescriptions. Patient is not currently taking opioid prescriptions. Functional ability and status Nutritional status Physical activity Advanced directives List of other physicians Hospitalizations, surgeries, and ER visits in previous 12 months Vitals Screenings to  include cognitive, depression, and falls Referrals and appointments  In addition, I have reviewed and discussed with patient certain preventive protocols, quality metrics, and best practice recommendations. A written personalized care plan for preventive services as well as general preventive health recommendations were provided to patient.     Beatris Ship, [place]   11/17/2021   Nurse Notes: None  I have reviewed and agree with Health Coaches documentation.  Kathlene November, MD

## 2021-11-18 ENCOUNTER — Other Ambulatory Visit: Payer: Self-pay

## 2021-11-19 ENCOUNTER — Encounter: Payer: Self-pay | Admitting: Internal Medicine

## 2021-11-19 ENCOUNTER — Ambulatory Visit (INDEPENDENT_AMBULATORY_CARE_PROVIDER_SITE_OTHER): Payer: Medicare HMO | Admitting: Internal Medicine

## 2021-11-19 VITALS — BP 128/80 | HR 53 | Temp 98.0°F | Resp 16 | Ht 69.0 in | Wt 231.0 lb

## 2021-11-19 DIAGNOSIS — R399 Unspecified symptoms and signs involving the genitourinary system: Secondary | ICD-10-CM | POA: Diagnosis not present

## 2021-11-19 DIAGNOSIS — I10 Essential (primary) hypertension: Secondary | ICD-10-CM

## 2021-11-19 DIAGNOSIS — E785 Hyperlipidemia, unspecified: Secondary | ICD-10-CM

## 2021-11-19 DIAGNOSIS — Z125 Encounter for screening for malignant neoplasm of prostate: Secondary | ICD-10-CM

## 2021-11-19 DIAGNOSIS — E1159 Type 2 diabetes mellitus with other circulatory complications: Secondary | ICD-10-CM

## 2021-11-19 LAB — URINALYSIS, ROUTINE W REFLEX MICROSCOPIC
Bilirubin Urine: NEGATIVE
Hgb urine dipstick: NEGATIVE
Ketones, ur: NEGATIVE
Nitrite: NEGATIVE
Specific Gravity, Urine: 1.02 (ref 1.000–1.030)
Total Protein, Urine: NEGATIVE
Urine Glucose: NEGATIVE
Urobilinogen, UA: 0.2 (ref 0.0–1.0)
pH: 5.5 (ref 5.0–8.0)

## 2021-11-19 LAB — BASIC METABOLIC PANEL
BUN: 16 mg/dL (ref 6–23)
CO2: 27 mEq/L (ref 19–32)
Calcium: 9.4 mg/dL (ref 8.4–10.5)
Chloride: 104 mEq/L (ref 96–112)
Creatinine, Ser: 0.92 mg/dL (ref 0.40–1.50)
GFR: 80.83 mL/min (ref >60.00)
Glucose, Bld: 100 mg/dL — ABNORMAL HIGH (ref 70–99)
Potassium: 4.1 mEq/L (ref 3.5–5.1)
Sodium: 139 mEq/L (ref 135–145)

## 2021-11-19 LAB — HEMOGLOBIN A1C: Hgb A1c MFr Bld: 6.3 % (ref 4.6–6.5)

## 2021-11-19 LAB — LIPID PANEL
Cholesterol: 134 mg/dL (ref 0–200)
HDL: 28.7 mg/dL — ABNORMAL LOW (ref >39.00)
LDL Cholesterol: 78 mg/dL (ref 0–99)
NonHDL: 104.95
Total CHOL/HDL Ratio: 5
Triglycerides: 135 mg/dL (ref 0.0–149.0)
VLDL: 27 mg/dL (ref 0.0–40.0)

## 2021-11-19 LAB — PSA: PSA: 1.65 ng/mL (ref 0.10–4.00)

## 2021-11-19 MED ORDER — FENOFIBRATE 145 MG PO TABS: 145.0000 mg | ORAL_TABLET | Freq: Every day | ORAL | 1 refills | Status: DC

## 2021-11-19 NOTE — Assessment & Plan Note (Signed)
Routine follow-up DM: On metformin, check A1c.  He walks 2 miles daily, diet is only "okay". HTN: Normal ambulatory BPs per patient, BP today is great, continue Micardis, metoprolol, amlodipine.  Check BMP High cholesterol: Last LDL 83, he has coronary calcifications, goal 70.  Continue fenofibrate, atorvastatin 40 mg..  Check FLP.  If not at goal, add Zetia?  Patient would not like to take another pill thus  consider increase atorvastatin to 60 mg or 80 mg. LUTS: Occasional nocturia without other symptoms, but states "not enough to take medication". He likes his PSA check, will also check a UA urine culture. Preventive care: Had a flu shot, planning a COVID-vaccine. RTC 6 months CPX

## 2021-11-19 NOTE — Patient Instructions (Addendum)
Check the  blood pressure regularly BP GOAL is between 110/65 and  135/85. If it is consistently higher or lower, let me know      GO TO THE LAB : Get the blood work     GO TO THE FRONT DESK, PLEASE SCHEDULE YOUR APPOINTMENTS Come back for physical exam in 6 months 

## 2021-11-19 NOTE — Progress Notes (Signed)
Subjective:    Patient ID: Jeffery Cline, male    DOB: [birth year], 76 y.o.   MRN: [id number]  DOS:  11/19/2021 Type of visit - description: f/u  Today we discussed his chronic medical problems. Since the last visit she is feeling well except for occasional nocturia.  Not really a new issue. Denies dysuria or gross hematuria.  Requests his PSA checked.  Wt Readings from Last 3 Encounters:  11/19/21 231 lb (104.8 kg)  11/17/21 231 lb (104.8 kg)  07/22/21 231 lb 12.8 oz (105.1 kg)     Review of Systems See above   Past Medical History:  Diagnosis Date   Allergic rhinitis    Allergy    Basal cell carcinoma 2016   Right medial posterior shoulder, shave and ED&C   Cataract    forming    Decreased carotid pulse    decreased L carotid pulse (?) : Nl carotidu/s 02-2006     ED (erectile dysfunction)    GERD (gastroesophageal reflux disease)    Glaucoma    Hyperlipidemia    Hypertension    SCC (squamous cell carcinoma)    Sleep apnea    on CPAP    Past Surgical History:  Procedure Laterality Date   CYSTECTOMY     back, recurrent, last 01-2016   INGUINAL HERNIA REPAIR     right by dr. Ninfa Linden   MOHS SURGERY Left 11/2018   face, betwen nose-ear    PILONIDAL CYST EXCISION     POLYPECTOMY     TONSILLECTOMY      Current Outpatient Medications  Medication Instructions   amLODipine (NORVASC) 5 mg, Oral, Daily   aspirin EC 81 mg, Oral, Daily, Swallow whole.   atorvastatin (LIPITOR) 40 MG tablet TAKE 1 TABLET BY MOUTH EVERYDAY AT BEDTIME   COMBIGAN 0.2-0.5 % ophthalmic solution INSTILL 1 DROP IN BOTH EYES TWICE A DAY   fenofibrate (TRICOR) 145 mg, Oral, Daily   metFORMIN (GLUCOPHAGE) 1000 MG tablet TAKE 1 TABLET (1,000 MG TOTAL) BY MOUTH TWICE A DAY WITH FOOD   metoprolol tartrate (LOPRESSOR) 50 mg, Oral, 2 times daily   pantoprazole (PROTONIX) 40 mg, Oral, Daily before breakfast   telmisartan (MICARDIS) 80 mg, Oral, Daily       Objective:   Physical Exam BP  128/80   Pulse (!) 53   Temp 98 F (36.7 C) (Oral)   Resp 16   Ht '5\' 9"'$  (1.753 m)   Wt 231 lb (104.8 kg)   SpO2 95%   BMI 34.11 kg/m  General:   Well developed, NAD, BMI noted. HEENT:  Normocephalic . Face symmetric, atraumatic Lungs:  CTA B Normal respiratory effort, no intercostal retractions, no accessory muscle use. Heart: RRR,  no murmur.  Lower extremities: no pretibial edema bilaterally  Skin: Not pale. Not jaundice Neurologic:  alert & oriented X3.  Speech normal, gait appropriate for age and unassisted Psych--  Cognition and judgment appear intact.  Cooperative with normal attention span and concentration.  Behavior appropriate. No anxious or depressed appearing.     Assessment     Assessment DM  A1c 6.5 (05-2016)  Obesity HTN Hyperlipidemia Coronary calcifications: Per CT, 01/2020 (Rx CV RF) Low testosterone: Saw endocrinology 06/2014 , mild hypogonadotropic hypogonadism. no rx recommended  Decreased libido , ED: not interested in Rx OSA -- on CPAP , rx Dr Elsworth Soho GERD ( dx based on response to cough to PPIs) Decreased L carotid pulse? Normal carotid ultrasound 2008 [facility] 2016- sees derm  q year Dr Ronnald Ramp as off 05-2021 Glaucoma    BPH Chest 01/2020: Bronchiectasis.  Scattered solid pulmonary nodules stable.  Three-vessel CAD.  Hepatic steatosis.  Ectatic ascending thoracic aorta unchanged  PLAN: Routine follow-up DM: On metformin, check A1c.  He walks 2 miles daily, diet is only "okay". HTN: Normal ambulatory BPs per patient, BP today is great, continue Micardis, metoprolol, amlodipine.  Check BMP High cholesterol: Last LDL 83, he has coronary calcifications, goal 70.  Continue fenofibrate, atorvastatin 40 mg..  Check FLP.  If not at goal, add Zetia?  Patient would not like to take another pill thus  consider increase atorvastatin to 60 mg or 80 mg. LUTS: Occasional nocturia without other symptoms, but states "not enough to take medication". He likes his PSA  check, will also check a UA urine culture. Preventive care: Had a flu shot, planning a COVID-vaccine. RTC 6 months CPX

## 2021-11-20 LAB — URINE CULTURE
MICRO NUMBER:: 14037183
SPECIMEN QUALITY:: ADEQUATE

## 2021-11-24 ENCOUNTER — Telehealth: Payer: Self-pay | Admitting: Internal Medicine

## 2021-11-24 MED ORDER — ATORVASTATIN CALCIUM 40 MG PO TABS: 60.0000 mg | ORAL_TABLET | Freq: Every day | ORAL | 1 refills | Status: DC

## 2021-11-24 MED ORDER — ATORVASTATIN CALCIUM 80 MG PO TABS: 80.0000 mg | ORAL_TABLET | Freq: Every day | ORAL | 1 refills | Status: DC

## 2021-11-24 NOTE — Telephone Encounter (Signed)
Rx sent  Mychart message sent

## 2021-11-24 NOTE — Addendum Note (Signed)
Addended byDamita Dunnings D on: 11/24/2021 01:22 PM   Modules accepted: Orders

## 2021-11-24 NOTE — Telephone Encounter (Signed)
Atorvastatin 80 mg 1 tab daily.  Notify patient.

## 2021-11-24 NOTE — Telephone Encounter (Signed)
Per pharmacy- Pt's insurance does not cover atorvastatin '40mg'$ - 1.5 tablets daily. Please advise?

## 2021-12-12 ENCOUNTER — Ambulatory Visit
Admission: EM | Admit: 2021-12-12 | Discharge: 2021-12-12 | Disposition: A | Discharge status: Home / Self Care | Payer: Medicare HMO | Attending: Emergency Medicine | Admitting: Emergency Medicine

## 2021-12-12 DIAGNOSIS — B9789 Other viral agents as the cause of diseases classified elsewhere: Secondary | ICD-10-CM | POA: Diagnosis present

## 2021-12-12 DIAGNOSIS — J988 Other specified respiratory disorders: Secondary | ICD-10-CM | POA: Diagnosis present

## 2021-12-12 DIAGNOSIS — R0982 Postnasal drip: Secondary | ICD-10-CM | POA: Diagnosis present

## 2021-12-12 DIAGNOSIS — R058 Other specified cough: Secondary | ICD-10-CM | POA: Insufficient documentation

## 2021-12-12 DIAGNOSIS — J00 Acute nasopharyngitis [common cold]: Secondary | ICD-10-CM | POA: Insufficient documentation

## 2021-12-12 DIAGNOSIS — Z1152 Encounter for screening for COVID-19: Secondary | ICD-10-CM | POA: Insufficient documentation

## 2021-12-12 MED ORDER — FLUTICASONE PROPIONATE 50 MCG/ACT NA SUSP: 1.0000 | Freq: Every day | NASAL | 2 refills | Status: DC

## 2021-12-12 MED ORDER — CETIRIZINE HCL 10 MG PO TABS: 10.0000 mg | ORAL_TABLET | Freq: Every day | ORAL | 1 refills | Status: DC

## 2021-12-12 MED ORDER — IPRATROPIUM BROMIDE 0.06 % NA SOLN: 2.0000 | Freq: Three times a day (TID) | NASAL | 1 refills | Status: DC

## 2021-12-12 NOTE — Discharge Instructions (Addendum)
You received a COVID-19 PCR test today.  The result of your COVID-19 test will be posted to your MyChart once it is complete, typically this takes 24 to 36 hours.     If your COVID-19 PCR test is positive, you will be contacted by phone.  Unfortunately, due to the current duration of your symptoms, you will no longer benefit from antiviral therapy for COVID-19.     If your COVID-19 PCR test is negative, then your illness is likely due to one of the many less serious illnesses that are circulating in our community right now.  Conservative care is recommended with rest, drinking plenty of clear fluids, eating only when hungry, eating supportive medications for your symptoms and avoiding being around other people.  Please remain at home until you are fever free for 24 hours without the use of antifever medications such as Tylenol and ibuprofen.  Other medications that you may find helpful, in addition to Robitussin-DM, include the following:    Zyrtec (cetirizine): This is an excellent second-generation antihistamine that helps to reduce respiratory inflammatory response to environmental allergens.  In some patients, this medication can cause daytime sleepiness so I recommend that you take 1 tablet daily at bedtime.  You may wish to continue this medication well into January to keep respiratory inflammation under control and reduce your susceptibility to other viruses circulating in our community right now.  I have provided you with a prescription.   Flonase (fluticasone): This is a steroid nasal spray that you use once daily, 1 spray in each nare.  This medication does not work well if you decide to use it only used as you feel you need to, it works best used on a daily basis.  After 3 to 5 days of use, you will notice significant reduction of the inflammation and mucus production that is currently being caused by exposure to allergens, whether seasonal or environmental.  The most common side effect of this  medication is nosebleeds.  If you experience a nosebleed, please discontinue use for 1 week, then feel free to resume.  You may wish to continue this medication well into January to keep respiratory inflammation under control and reduce your susceptibility to other viruses circulating in our community right now.  I have provided you with a prescription.     Atrovent (ipratropium): This is an excellent nasal decongestant spray I have added to your recommended nasal steroid that will not cause rebound congestion, please instill 2 sprays into each nare with each use.  Because nasal steroids can take several days before they begin to provide full benefit, I recommend that you use this spray in addition to the nasal steroid prescribed for you.  Please use it after you have used your nasal steroid and repeat up to 4 times daily as needed.  Once you find that you for getting to use this medication more than you remember to use this medication, you consider it no longer necessary.  I have provided you with a prescription for this medication.      If you find that your health insurance will not pay for allergy medications, please consider downloading the GoodRx app and using to get a better price than the "off the shelf" price.     Please follow-up within the next 5-7 days either with your primary care provider or urgent care if your symptoms do not resolve.  If you do not have a primary care provider, we will assist you in finding  one.        Thank you for visiting urgent care today.  We appreciate the opportunity to participate in your care.

## 2021-12-12 NOTE — ED Provider Notes (Signed)
[facility]    CSN: [id number] Arrival date & time: 12/12/21  6203    HISTORY   Chief Complaint  Patient presents with   Cough   Nasal Congestion   HPI Jeffery Cline is a pleasant, 76 y.o. male who presents to urgent care today. Patient complains of a scratchy, raspy throat, clear nasal drainage and cough productive of "green" mucus which is worse in the morning.  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  Patient states he took a 69 antigen test at home which was negative.  Patient states he has been taking Mucinex DM and Advil for his symptoms.  Patient has significantly elevated blood pressure on arrival today, patient reports a history of hypertension, states he has not taken his blood pressure medicine yet this morning.  Patient denies history of allergies and asthma, headache, body ache, fever, nausea, vomiting, diarrhea, sinus pressure, otalgia.  The history is provided by the patient.   Past Medical History:  Diagnosis Date   Allergic rhinitis    Allergy    Basal cell carcinoma 2016   Right medial posterior shoulder, shave and ED&C   Cataract    forming    Decreased carotid pulse    decreased L carotid pulse (?) : Nl carotidu/s 02-2006     ED (erectile dysfunction)    GERD (gastroesophageal reflux disease)    Glaucoma    Hyperlipidemia    Hypertension    SCC (squamous cell carcinoma)    Sleep apnea    on CPAP   Patient Active Problem List   Diagnosis Date Noted   Chronic rhinitis 07/22/2021   Diabetes ([place]) 12/16/2020   Bronchiectasis without complication ([place]) 55/97/4163   SCC (squamous cell carcinoma) 12/14/2019   Glaucoma 06/05/2019   Macular degeneration 06/05/2019   Pulmonary nodule seen on imaging study 01/23/2019   BCC (basal cell carcinoma of skin) 12/05/2018   Cervical spondylosis without myelopathy 06/20/2018    GERD (gastroesophageal reflux disease) 09/05/2015   PCP NOTES >>>>>>>>>> 04/30/2015   Morbid obesity ([place]) 04/30/2015   OSA (obstructive sleep apnea) 12/06/2014   Low serum testosterone level 06/20/2014   Fatigue 06/04/2014   Personal history of colonic polyps - adenomas 04/21/2011   Annual physical exam 01/20/2011   Cough 08/29/2010   ERECTILE DYSFUNCTION, ORGANIC 10/09/2009   Hyperlipidemia 06/29/2006   Essential hypertension 06/29/2006   Past Surgical History:  Procedure Laterality Date   CYSTECTOMY     back, recurrent, last 01-2016   INGUINAL HERNIA REPAIR     right by dr. Ninfa Linden   MOHS SURGERY Left 11/2018   face, betwen nose-ear    PILONIDAL CYST EXCISION     POLYPECTOMY     TONSILLECTOMY      Home Medications    Prior to Admission medications   Medication Sig Start Date End Date Taking? Authorizing Provider  amLODipine (NORVASC) 5 MG tablet Take 1 tablet (5 mg total) by mouth daily. 11/03/21   Colon Branch, MD  aspirin EC 81 MG tablet Take 81 mg by mouth daily. Swallow whole.    [provider]  atorvastatin (LIPITOR) 80 MG tablet Take 1 tablet (80 mg total) by mouth at bedtime. 11/24/21   Colon Branch, MD  COMBIGAN 0.2-0.5 % ophthalmic solution INSTILL 1 DROP IN BOTH EYES TWICE A DAY 03/14/15   [provider]  fenofibrate (TRICOR) 145 MG tablet Take 1 tablet (145 mg total) by mouth daily. 11/19/21   Colon Branch, MD  metFORMIN (GLUCOPHAGE) 1000 MG tablet TAKE 1 TABLET (1,000 MG TOTAL) BY MOUTH TWICE A DAY WITH FOOD 06/26/21   Colon Branch, MD  metoprolol tartrate (LOPRESSOR) 50 MG tablet Take 1 tablet (50 mg total) by mouth 2 (two) times daily. 11/03/21   Colon Branch, MD  pantoprazole (PROTONIX) 40 MG tablet Take 40 mg by mouth daily before breakfast. 06/07/20   Colon Branch, MD  telmisartan (MICARDIS) 80 MG tablet Take 1 tablet (80 mg total) by mouth daily. 11/03/21   Colon Branch, MD    Family History Family History  Problem Relation Age of Onset    Dementia Mother    Alcohol abuse Mother    Coronary artery disease Father 79   Coronary artery disease Maternal Grandfather    Leukemia Paternal Grandfather    Colon cancer Neg Hx    Prostate cancer Neg Hx    Colon polyps Neg Hx    Rectal cancer Neg Hx    Stomach cancer Neg Hx    Social History Social History   Tobacco Use   Smoking status: Never   Smokeless tobacco: Never   Tobacco comments:    Socially in college.  Substance Use Topics   Alcohol use: Not Currently    Comment:     Drug use: No   Allergies   Patient has no known allergies.  Review of Systems Review of Systems Pertinent findings revealed after performing a 14 point review of systems has been noted in the history of present illness.  Physical Exam Triage Vital Signs ED Triage Vitals  Enc Vitals Group     BP 12/06/20 0827 (!) 147/82     Pulse Rate 12/06/20 0827 72     Resp 12/06/20 0827 18     Temp 12/06/20 0827 98.3 F (36.8 C)     Temp Source 12/06/20 0827 Oral     SpO2 12/06/20 0827 98 %     Weight --      Height --      Head Circumference --      Peak Flow --      Pain Score 12/06/20 0826 5     Pain Loc --      Pain Edu? --      Excl. in [place]? --   No data found.  Updated Vital Signs BP (!) 155/93 (BP Location: Left Arm)   Pulse 66   Temp 98 F (36.7 C) (Oral)   Resp 16   SpO2 96%   Physical Exam Vitals and nursing note reviewed.  Constitutional:      General: He is not in acute distress.    Appearance: Normal appearance. He is not ill-appearing.  HENT:     Head: Normocephalic and atraumatic.     Salivary Glands: Right salivary gland is not diffusely enlarged or tender. Left salivary gland is not diffusely enlarged or tender.     Right Ear: Tympanic membrane, ear canal and external ear normal. No drainage. No middle ear effusion. There is no impacted cerumen. Tympanic membrane is not erythematous or bulging.     Left Ear: Tympanic membrane, ear canal and external ear normal. No  drainage.  No middle ear effusion. There is no impacted cerumen. Tympanic membrane is not erythematous or bulging.     Nose: Mucosal edema, congestion and rhinorrhea present. No nasal  deformity or septal deviation. Rhinorrhea is clear.     Right Turbinates: Not enlarged, swollen or pale.     Left Turbinates: Not enlarged, swollen or pale.     Right Sinus: No maxillary sinus tenderness or frontal sinus tenderness.     Left Sinus: No maxillary sinus tenderness or frontal sinus tenderness.     Mouth/Throat:     Lips: Pink. No lesions.     Mouth: Mucous membranes are moist. No oral lesions.     Pharynx: Oropharynx is clear. Uvula midline. No pharyngeal swelling, oropharyngeal exudate, posterior oropharyngeal erythema or uvula swelling.     Tonsils: No tonsillar exudate. 0 on the right. 0 on the left.     Comments: ++Postnasal drip Eyes:     General: Lids are normal.        Right eye: No discharge.        Left eye: No discharge.     Extraocular Movements: Extraocular movements intact.     Conjunctiva/sclera: Conjunctivae normal.     Right eye: Right conjunctiva is not injected.     Left eye: Left conjunctiva is not injected.  Neck:     Trachea: Trachea and phonation normal.  Cardiovascular:     Rate and Rhythm: Normal rate and regular rhythm.     Pulses: Normal pulses.     Heart sounds: Normal heart sounds. No murmur heard.    No friction rub. No gallop.  Pulmonary:     Effort: Pulmonary effort is normal. No tachypnea, bradypnea, accessory muscle usage, prolonged expiration, respiratory distress or retractions.     Breath sounds: Normal breath sounds and air entry. No stridor, decreased air movement or transmitted upper airway sounds. No decreased breath sounds, wheezing, rhonchi or rales.  Chest:     Chest wall: No tenderness.  Musculoskeletal:        General: Normal range of motion.     Cervical back: Full passive range of motion without pain, normal range of motion and neck supple.  Normal range of motion.  Lymphadenopathy:     Cervical: No cervical adenopathy.  Skin:    General: Skin is warm and dry.     Findings: No erythema or rash.  Neurological:     General: No focal deficit present.     Mental Status: He is alert and oriented to person, place, and time.  Psychiatric:        Mood and Affect: Mood normal.        Behavior: Behavior normal.     Visual Acuity Right Eye Distance:   Left Eye Distance:   Bilateral Distance:    Right Eye Near:   Left Eye Near:    Bilateral Near:     UC Couse / Diagnostics / Procedures:     Radiology No results found.  Procedures Procedures (including critical care time) EKG  Pending results:  Labs Reviewed  SARS CORONAVIRUS 2 (TAT 6-24 HRS)    Medications Ordered in UC: Medications - No data to display  UC Diagnoses / Final Clinical Impressions(s)   I have reviewed the triage vital signs and the nursing notes.  Pertinent labs & imaging results that were available during my care of the patient were reviewed by me and considered in my medical decision making (see chart for details).    Final diagnoses:  Encounter for screening for COVID-19  Acute rhinitis  Postnasal drip  Nonproductive cough  Viral respiratory infection   Patient advised that due to duration of symptoms,  even if his COVID-19 test today is positive, he no longer needs to isolate.  Patient states he would like a COVID-19 test because his wife is concerned.  Patient states he is fully vaccinated for COVID-19, received at the [facility] COVID-19 vaccine a month ago.  For symptomatic relief of rhinitis causing postnasal drip and persistent cough due to likely viral respiratory infection, patient was provided with cetirizine, Flonase and ipratropium nasal spray.  Return precautions were advised.  ED Prescriptions     Medication Sig Dispense Auth. Provider   fluticasone (FLONASE) 50 MCG/ACT nasal spray Place 1 spray into both nostrils daily. Begin  by using 2 sprays in each nare daily for 3 to 5 days, then decrease to 1 spray in each nare daily. 15.8 mL Lynden Oxford Scales, PA-C   cetirizine (ZYRTEC ALLERGY) 10 MG tablet Take 1 tablet (10 mg total) by mouth at bedtime. 90 tablet Lynden Oxford Scales, PA-C   ipratropium (ATROVENT) 0.06 % nasal spray Place 2 sprays into both nostrils 3 (three) times daily. As needed for nasal congestion, runny nose 15 mL Lynden Oxford Scales, PA-C      PDMP not reviewed this encounter.  Disposition Upon Discharge:  Condition: stable for discharge home Home: take medications as prescribed; routine discharge instructions as discussed; follow up as advised.  Patient presented with an acute illness with associated systemic symptoms and significant discomfort requiring urgent management. In my opinion, this is a condition that a prudent lay person (someone who possesses an average knowledge of health and medicine) may potentially expect to result in complications if not addressed urgently such as respiratory distress, impairment of bodily function or dysfunction of bodily organs.   Routine symptom specific, illness specific and/or disease specific instructions were discussed with the patient and/or caregiver at length.   As such, the patient has been evaluated and assessed, work-up was performed and treatment was provided in alignment with urgent care protocols and evidence based medicine.  Patient/parent/caregiver has been advised that the patient may require follow up for further testing and treatment if the symptoms continue in spite of treatment, as clinically indicated and appropriate.  If the patient was tested for COVID-19, Influenza and/or RSV, then the patient/parent/guardian was advised to isolate at home pending the results of his/her diagnostic coronavirus test and potentially longer if they're positive. I have also advised pt that if his/her COVID-19 test returns positive, it's recommended to  self-isolate for at least 10 days after symptoms first appeared AND until fever-free for 24 hours without fever reducer AND other symptoms have improved or resolved. Discussed self-isolation recommendations as well as instructions for household member/close contacts as per the [facility] and [place] [facility], and also gave patient the [place] packet with this information.  Patient/parent/caregiver has been advised to return to the [facility] or PCP in 3-5 days if no better; to PCP or the Emergency Department if new signs and symptoms develop, or if the current signs or symptoms continue to change or worsen for further workup, evaluation and treatment as clinically indicated and appropriate  The patient will follow up with their current PCP if and as advised. If the patient does not currently have a PCP we will assist them in obtaining one.   The patient may need specialty follow up if the symptoms continue, in spite of conservative treatment and management, for further workup, evaluation, consultation and treatment as clinically indicated and appropriate.  Patient/parent/caregiver verbalized understanding and agreement of plan as discussed.  All questions were  addressed during visit.  Please see discharge instructions below for further details of plan.  Discharge Instructions:   Discharge Instructions      You received a COVID-19 PCR test today.  The result of your COVID-19 test will be posted to your MyChart once it is complete, typically this takes 24 to 36 hours.     If your COVID-19 PCR test is positive, you will be contacted by phone.  Unfortunately, due to the current duration of your symptoms, you will no longer benefit from antiviral therapy for COVID-19.     If your COVID-19 PCR test is negative, then your illness is likely due to one of the many less serious illnesses that are circulating in our community right now.  Conservative care is recommended with rest, drinking plenty of clear fluids, eating only when  hungry, eating supportive medications for your symptoms and avoiding being around other people.  Please remain at home until you are fever free for 24 hours without the use of antifever medications such as Tylenol and ibuprofen.  Other medications that you may find helpful, in addition to Robitussin-DM, include the following:    Zyrtec (cetirizine): This is an excellent second-generation antihistamine that helps to reduce respiratory inflammatory response to environmental allergens.  In some patients, this medication can cause daytime sleepiness so I recommend that you take 1 tablet daily at bedtime.  You may wish to continue this medication well into January to keep respiratory inflammation under control and reduce your susceptibility to other viruses circulating in our community right now.  I have provided you with a prescription.   Flonase (fluticasone): This is a steroid nasal spray that you use once daily, 1 spray in each nare.  This medication does not work well if you decide to use it only used as you feel you need to, it works best used on a daily basis.  After 3 to 5 days of use, you will notice significant reduction of the inflammation and mucus production that is currently being caused by exposure to allergens, whether seasonal or environmental.  The most common side effect of this medication is nosebleeds.  If you experience a nosebleed, please discontinue use for 1 week, then feel free to resume.  You may wish to continue this medication well into January to keep respiratory inflammation under control and reduce your susceptibility to other viruses circulating in our community right now.  I have provided you with a prescription.     Atrovent (ipratropium): This is an excellent nasal decongestant spray I have added to your recommended nasal steroid that will not cause rebound congestion, please instill 2 sprays into each nare with each use.  Because nasal steroids can take several days before  they begin to provide full benefit, I recommend that you use this spray in addition to the nasal steroid prescribed for you.  Please use it after you have used your nasal steroid and repeat up to 4 times daily as needed.  Once you find that you for getting to use this medication more than you remember to use this medication, you consider it no longer necessary.  I have provided you with a prescription for this medication.      If you find that your health insurance will not pay for allergy medications, please consider downloading the GoodRx app and using to get a better price than the "off the shelf" price.     Please follow-up within the next 5-7 days either with your primary  care provider or urgent care if your symptoms do not resolve.  If you do not have a primary care provider, we will assist you in finding one.        Thank you for visiting urgent care today.  We appreciate the opportunity to participate in your care.         This office note has been dictated using Museum/gallery curator.  Unfortunately, this method of dictation can sometimes lead to typographical or grammatical errors.  I apologize for your inconvenience in advance if this occurs.  Please do not hesitate to reach out to me if clarification is needed.      Lynden Oxford Scales, PA-C 12/12/21 1120

## 2021-12-12 NOTE — ED Triage Notes (Signed)
The pt c/o throat discomfort (raspy feeling per patient), nasal drainage, and productive cough (green mucus).  Started: 4 days ago   Covid test at home was negative Wednesday.   Home interventions: mucinex dm, Advil

## 2021-12-13 LAB — SARS CORONAVIRUS 2 (TAT 6-24 HRS): SARS Coronavirus 2: NEGATIVE

## 2021-12-16 ENCOUNTER — Encounter: Payer: Self-pay | Admitting: Internal Medicine

## 2021-12-16 ENCOUNTER — Ambulatory Visit (INDEPENDENT_AMBULATORY_CARE_PROVIDER_SITE_OTHER): Payer: Medicare HMO | Admitting: Internal Medicine

## 2021-12-16 VITALS — BP 144/84 | HR 64 | Temp 98.2°F | Resp 18 | Ht 69.0 in | Wt 226.1 lb

## 2021-12-16 DIAGNOSIS — J40 Bronchitis, not specified as acute or chronic: Secondary | ICD-10-CM | POA: Diagnosis not present

## 2021-12-16 MED ORDER — BENZONATATE 200 MG PO CAPS: 200.0000 mg | ORAL_CAPSULE | Freq: Three times a day (TID) | ORAL | 0 refills | Status: DC | PRN

## 2021-12-16 MED ORDER — AMOXICILLIN-POT CLAVULANATE 875-125 MG PO TABS: 1.0000 | ORAL_TABLET | Freq: Two times a day (BID) | ORAL | 0 refills | Status: DC

## 2021-12-16 NOTE — Assessment & Plan Note (Signed)
Bronchitis, pharyngitis: Symptoms started about 8 days ago, in the context of history of bronchiectasis. Patient is nontoxic, COVID test negative x2. No SOB but O2 sat  93% initially, recheck: 97% Plan: Continue conservative treatment with nasal sprays, Mucinex DM, add Tessalon Perles. Start Augmentin. Call if not gradually better.

## 2021-12-16 NOTE — Progress Notes (Signed)
Subjective:    Patient ID: Jeffery Cline, male    DOB: Apr 29, 1945, 76 y.o.   MRN: [id number]  DOS:  12/16/2021 Type of visit - description: acute  He went to [place] to visit family, shortly after he returned developed symptoms around 12/08/2021: Mild throat discomfort, postnasal dripping, cough. + Sputum, gray to greenish in color, no hemoptysis. Went to urgent care, COVID test negative.  Was recommended conservative treatment. Had another  COVID test at home: Negative.  He is here because he is not improving much. Denies fever chills No chest pain no difficulty breathing No nausea vomiting No unusual aches or pains. + Sinus congestion.  Review of Systems See above   Past Medical History:  Diagnosis Date   Allergic rhinitis    Allergy    Basal cell carcinoma 2016   Right medial posterior shoulder, shave and ED&C   Cataract    forming    Decreased carotid pulse    decreased L carotid pulse (?) : Nl carotidu/s 02-2006     ED (erectile dysfunction)    GERD (gastroesophageal reflux disease)    Glaucoma    Hyperlipidemia    Hypertension    SCC (squamous cell carcinoma)    Sleep apnea    on CPAP    Past Surgical History:  Procedure Laterality Date   CYSTECTOMY     back, recurrent, last 01-2016   INGUINAL HERNIA REPAIR     right by dr. Ninfa Linden   MOHS SURGERY Left 11/2018   face, betwen nose-ear    PILONIDAL CYST EXCISION     POLYPECTOMY     TONSILLECTOMY      Current Outpatient Medications  Medication Instructions   amLODipine (NORVASC) 5 mg, Oral, Daily   aspirin EC 81 mg, Oral, Daily, Swallow whole.   atorvastatin (LIPITOR) 80 mg, Oral, Daily at bedtime   cetirizine (ZYRTEC ALLERGY) 10 mg, Oral, Daily at bedtime   COMBIGAN 0.2-0.5 % ophthalmic solution INSTILL 1 DROP IN BOTH EYES TWICE A DAY   fenofibrate (TRICOR) 145 mg, Oral, Daily   fluticasone (FLONASE) 50 MCG/ACT nasal spray 1 spray, Each Nare, Daily, Begin by using 2 sprays in each nare  daily for 3 to 5 days, then decrease to 1 spray in each nare daily.   ipratropium (ATROVENT) 0.06 % nasal spray 2 sprays, Each Nare, 3 times daily, As needed for nasal congestion, runny nose   metFORMIN (GLUCOPHAGE) 1000 MG tablet TAKE 1 TABLET (1,000 MG TOTAL) BY MOUTH TWICE A DAY WITH FOOD   metoprolol tartrate (LOPRESSOR) 50 mg, Oral, 2 times daily   pantoprazole (PROTONIX) 40 mg, Oral, Daily before breakfast   telmisartan (MICARDIS) 80 mg, Oral, Daily       Objective:   Physical Exam BP (!) 162/76   Pulse 64   Temp 98.2 F (36.8 C) (Oral)   Resp 18   Ht '5\' 9"'$  (1.753 m)   Wt 226 lb 2 oz (102.6 kg)   SpO2 93%   BMI 33.39 kg/m  General:   Well developed, NAD, BMI noted. HEENT:  Normocephalic . Face symmetric, atraumatic TMs: Obscured by wax. Nose slightly congested, sinuses non-TTP. Throat: Slightly red, had a single white patch, 2 to 3 mm in size on the left tonsil, tonsil otherwise symmetric and normal in size. Lungs:  + Rhonchi bilaterally, no wheezing. Normal respiratory effort, no intercostal retractions, no accessory muscle use. Heart: RRR,  no murmur.  Lower extremities: no pretibial edema bilaterally  Skin:  Not pale. Not jaundice Neurologic:  alert & oriented X3.  Speech normal, gait appropriate for age and unassisted Psych--  Cognition and judgment appear intact.  Cooperative with normal attention span and concentration.  Behavior appropriate. No anxious or depressed appearing.      Assessment     Assessment DM  A1c 6.5 (05-2016)  Obesity HTN Hyperlipidemia Coronary calcifications: Per CT, 01/2020 (Rx CV RF) Low testosterone: Saw endocrinology 06/2014 , mild hypogonadotropic hypogonadism. no rx recommended  Decreased libido , ED: not interested in Rx OSA -- on CPAP , rx Dr Elsworth Soho GERD ( dx based on response to cough to PPIs) Decreased L carotid pulse? Normal carotid ultrasound [facility] 2016- sees derm q year Dr Ronnald Ramp as off 05-2021 Glaucoma     BPH Chest 01/2020: Bronchiectasis.  Scattered solid pulmonary nodules stable.  Three-vessel CAD.  Hepatic steatosis.  Ectatic ascending thoracic aorta unchanged  PLAN: Bronchitis, pharyngitis: Symptoms started about 8 days ago, in the context of history of bronchiectasis. Patient is nontoxic, COVID test negative x2. No SOB but O2 sat  93% initially, recheck: 97% Plan: Continue conservative treatment with nasal sprays, Mucinex DM, add Tessalon Perles. Start Augmentin. Call if not gradually better.

## 2021-12-16 NOTE — Patient Instructions (Addendum)
Continue using thenose sprays  Tylenol as needed  For cough: Take over-the-counter medications such as Mucinex DM or Robitussin-DM In addition, you can take Tessalon Perles 3 times a day as needed for severe cough.  Start the antibiotic called Augmentin.  Call if not gradually better  Call if severe symptoms    Per our records you are due for your diabetic eye exam. Please contact your eye doctor to schedule an appointment. Please have them send copies of your office visit notes to [place]. Our fax number is [phone]. If you need a referral to an eye doctor please let us know.

## 2021-12-17 ENCOUNTER — Ambulatory Visit: Payer: Medicare HMO | Admitting: Internal Medicine

## 2021-12-25 LAB — HM DIABETES EYE EXAM

## 2021-12-29 ENCOUNTER — Encounter: Payer: Self-pay | Admitting: Internal Medicine

## 2022-02-01 ENCOUNTER — Ambulatory Visit
Admission: EM | Admit: 2022-02-01 | Discharge: 2022-02-01 | Disposition: A | Discharge status: Home / Self Care | Payer: Medicare HMO | Attending: Urgent Care | Admitting: Urgent Care

## 2022-02-01 DIAGNOSIS — J309 Allergic rhinitis, unspecified: Secondary | ICD-10-CM

## 2022-02-01 DIAGNOSIS — I10 Essential (primary) hypertension: Secondary | ICD-10-CM

## 2022-02-01 DIAGNOSIS — E119 Type 2 diabetes mellitus without complications: Secondary | ICD-10-CM

## 2022-02-01 DIAGNOSIS — Z1152 Encounter for screening for COVID-19: Secondary | ICD-10-CM | POA: Insufficient documentation

## 2022-02-01 DIAGNOSIS — B349 Viral infection, unspecified: Secondary | ICD-10-CM

## 2022-02-01 MED ORDER — PAXLOVID (300/100) 20 X 150 MG & 10 X 100MG PO TBPK: ORAL_TABLET | ORAL | 0 refills | Status: DC

## 2022-02-01 MED ORDER — IPRATROPIUM BROMIDE 0.03 % NA SOLN: 2.0000 | Freq: Two times a day (BID) | NASAL | 0 refills | Status: DC

## 2022-02-01 MED ORDER — CETIRIZINE HCL 10 MG PO TABS
10.0000 mg | ORAL_TABLET | Freq: Every day | ORAL | 0 refills | Status: AC
Start: 2022-02-01 — End: ?

## 2022-02-01 MED ORDER — PROMETHAZINE-DM 6.25-15 MG/5ML PO SYRP: 2.5000 mL | ORAL_SOLUTION | Freq: Three times a day (TID) | ORAL | 0 refills | Status: DC | PRN

## 2022-02-01 NOTE — ED Provider Notes (Signed)
[facility]  Note:  This document was prepared using Systems analyst and may include unintentional dictation errors.  MRN: [id number] DOB: 07-Aug-1945  Subjective:   Jeffery Cline is a 76 y.o. male presenting for 4 day history of throat congestion, hoarseness, chest congestion, sinus congestion/pressure, cough.  Last A1c was 6.3% at 11/19/2021 visit. No fever, body pains, chest pain, shob, wheezing. No smoking. Has a history of allergic rhinitis. Overall admits that he does not feel terrible but his wife wanted to make sure he got checked.  No current facility-administered medications for this encounter.  Current Outpatient Medications:    amLODipine (NORVASC) 5 MG tablet, Take 1 tablet (5 mg total) by mouth daily., Disp: 90 tablet, Rfl: 1   amoxicillin-clavulanate (AUGMENTIN) 875-125 MG tablet, Take 1 tablet by mouth 2 (two) times daily., Disp: 14 tablet, Rfl: 0   aspirin EC 81 MG tablet, Take 81 mg by mouth daily. Swallow whole., Disp: , Rfl:    atorvastatin (LIPITOR) 80 MG tablet, Take 1 tablet (80 mg total) by mouth at bedtime., Disp: 90 tablet, Rfl: 1   benzonatate (TESSALON) 200 MG capsule, Take 1 capsule (200 mg total) by mouth 3 (three) times daily as needed for cough., Disp: 20 capsule, Rfl: 0   cetirizine (ZYRTEC ALLERGY) 10 MG tablet, Take 1 tablet (10 mg total) by mouth at bedtime., Disp: 90 tablet, Rfl: 1   COMBIGAN 0.2-0.5 % ophthalmic solution, INSTILL 1 DROP IN BOTH EYES TWICE A DAY, Disp: , Rfl: 3   fenofibrate (TRICOR) 145 MG tablet, Take 1 tablet (145 mg total) by mouth daily., Disp: 90 tablet, Rfl: 1   fluticasone (FLONASE) 50 MCG/ACT nasal spray, Place 1 spray into both nostrils daily. Begin by using 2 sprays in each nare daily for 3 to 5 days, then decrease to 1 spray in each nare daily., Disp: 15.8 mL, Rfl: 2   ipratropium (ATROVENT) 0.06 % nasal spray, Place 2 sprays into both nostrils 3 (three) times daily. As needed for  nasal congestion, runny nose, Disp: 15 mL, Rfl: 1   metFORMIN (GLUCOPHAGE) 1000 MG tablet, TAKE 1 TABLET (1,000 MG TOTAL) BY MOUTH TWICE A DAY WITH FOOD, Disp: 180 tablet, Rfl: 1   metoprolol tartrate (LOPRESSOR) 50 MG tablet, Take 1 tablet (50 mg total) by mouth 2 (two) times daily., Disp: 180 tablet, Rfl: 1   pantoprazole (PROTONIX) 40 MG tablet, Take 40 mg by mouth daily before breakfast., Disp: , Rfl:    telmisartan (MICARDIS) 80 MG tablet, Take 1 tablet (80 mg total) by mouth daily., Disp: 90 tablet, Rfl: 1   No Known Allergies  Past Medical History:  Diagnosis Date   Allergic rhinitis    Allergy    Basal cell carcinoma 2016   Right medial posterior shoulder, shave and ED&C   Cataract    forming    Decreased carotid pulse    decreased L carotid pulse (?) : Nl carotidu/s 02-2006     ED (erectile dysfunction)    GERD (gastroesophageal reflux disease)    Glaucoma    Hyperlipidemia    Hypertension    SCC (squamous cell carcinoma)    Sleep apnea    on CPAP     Past Surgical History:  Procedure Laterality Date   CYSTECTOMY     back, recurrent, last 01-2016   INGUINAL HERNIA REPAIR     right by dr. Ninfa Linden   MOHS SURGERY Left 11/2018   face, betwen nose-ear  PILONIDAL CYST EXCISION     POLYPECTOMY     TONSILLECTOMY      Family History  Problem Relation Age of Onset   Dementia Mother    Alcohol abuse Mother    Coronary artery disease Father 63   Coronary artery disease Maternal Grandfather    Leukemia Paternal Grandfather    Colon cancer Neg Hx    Prostate cancer Neg Hx    Colon polyps Neg Hx    Rectal cancer Neg Hx    Stomach cancer Neg Hx     Social History   Tobacco Use   Smoking status: Never   Smokeless tobacco: Never   Tobacco comments:    Socially in college.  Vaping Use   Vaping Use: Never used  Substance Use Topics   Alcohol use: Not Currently    Comment:     Drug use: No    ROS   Objective:   Vitals: BP (!) 172/84 (BP Location:  Right Arm)   Pulse 67   Temp 100 F (37.8 C) (Oral)   Resp 20   SpO2 94%   Physical Exam Constitutional:      General: He is not in acute distress.    Appearance: Normal appearance. He is well-developed and normal weight. He is not ill-appearing, toxic-appearing or diaphoretic.  HENT:     Head: Normocephalic and atraumatic.     Right Ear: Tympanic membrane, ear canal and external ear normal. No drainage, swelling or tenderness. No middle ear effusion. There is no impacted cerumen. Tympanic membrane is not erythematous or bulging.     Left Ear: Tympanic membrane, ear canal and external ear normal. No drainage, swelling or tenderness.  No middle ear effusion. There is no impacted cerumen. Tympanic membrane is not erythematous or bulging.     Nose: Nose normal. No congestion or rhinorrhea.     Mouth/Throat:     Mouth: Mucous membranes are moist.     Pharynx: No oropharyngeal exudate or posterior oropharyngeal erythema.  Eyes:     General: No scleral icterus.       Right eye: No discharge.        Left eye: No discharge.     Extraocular Movements: Extraocular movements intact.     Conjunctiva/sclera: Conjunctivae normal.  Cardiovascular:     Rate and Rhythm: Normal rate and regular rhythm.     Heart sounds: Normal heart sounds. No murmur heard.    No friction rub. No gallop.  Pulmonary:     Effort: Pulmonary effort is normal. No respiratory distress.     Breath sounds: Normal breath sounds. No stridor. No wheezing, rhonchi or rales.  Musculoskeletal:     Cervical back: Normal range of motion and neck supple. No rigidity. No muscular tenderness.  Neurological:     General: No focal deficit present.     Mental Status: He is alert and oriented to person, place, and time.  Psychiatric:        Mood and Affect: Mood normal.        Behavior: Behavior normal.        Thought Content: Thought content normal.     Assessment and Plan :   PDMP not reviewed this encounter.  1. Acute  viral syndrome   2. Allergic rhinitis, unspecified seasonality, unspecified trigger   3. Type 2 diabetes mellitus treated without insulin ([place])   4. Essential hypertension     Deferred imaging given clear cardiopulmonary exam, hemodynamically stable vital signs.  Recommended  patient get COVID testing.  Provided him with a printed prescription of Paxlovid should he test positive, he could go and fill the prescription.  His results will go to MyChart.  Our clinic will be closed tomorrow and this is my effort to get him treated.  Use supportive care otherwise. Counseled patient on potential for adverse effects with medications prescribed/recommended today, ER and return-to-clinic precautions discussed, patient verbalized understanding.    Jaynee Eagles, [place] 02/01/22 [phone]

## 2022-02-01 NOTE — Discharge Instructions (Addendum)
We will notify you of your test results as they arrive and may take between about 24 hours.  I encourage you to sign up for MyChart if you have not already done so as this can be the easiest way for [place] to communicate results to you online or through a phone app.  Generally, we only contact you if it is a positive test result.  In the meantime, if you develop worsening symptoms including fever, chest pain, shortness of breath despite our current treatment plan then please report to the emergency room as this may be a sign of worsening status from possible viral infection.  Otherwise, we will manage this as a viral syndrome. For sore throat or cough try using a honey-based tea. Use 3 teaspoons of honey with juice squeezed from half lemon. Place shaved pieces of ginger into 1/2-1 cup of water and warm over stove top. Then mix the ingredients and repeat every 4 hours as needed. Please take Tylenol '500mg'$ -'650mg'$  every 6 hours for aches and pains, fevers. Hydrate very well with at least 2 liters of water. Eat light meals such as soups to replenish electrolytes and soft fruits, veggies. Start an antihistamine like Zyrtec for postnasal drainage, sinus congestion.  You can take this together with Atrovent (nasal spray) as needed for the same kind of congestion.  Use the cough medications as needed.   If your COVID test result is positive, then start Paxlovid. Do not take atorvastatin while you are on Paxlovid.

## 2022-02-01 NOTE — ED Triage Notes (Signed)
Pt c/o cough, head/chest congestion x 4 days-NAD-steady gait

## 2022-02-02 LAB — SARS CORONAVIRUS 2 (TAT 6-24 HRS): SARS Coronavirus 2: NEGATIVE

## 2022-03-22 ENCOUNTER — Other Ambulatory Visit: Payer: Self-pay | Admitting: Internal Medicine

## 2022-04-07 ENCOUNTER — Encounter: Payer: Self-pay | Admitting: Internal Medicine

## 2022-04-09 ENCOUNTER — Encounter: Payer: Self-pay | Admitting: Internal Medicine

## 2022-04-27 ENCOUNTER — Other Ambulatory Visit: Payer: Self-pay | Admitting: Internal Medicine

## 2022-05-16 ENCOUNTER — Other Ambulatory Visit: Payer: Self-pay | Admitting: Internal Medicine

## 2022-05-18 ENCOUNTER — Encounter: Payer: Self-pay | Admitting: Internal Medicine

## 2022-05-19 ENCOUNTER — Encounter: Payer: Self-pay | Admitting: Internal Medicine

## 2022-05-22 ENCOUNTER — Encounter: Payer: Medicare HMO | Admitting: Internal Medicine

## 2022-06-02 ENCOUNTER — Encounter: Payer: Self-pay | Admitting: Internal Medicine

## 2022-06-02 ENCOUNTER — Ambulatory Visit (INDEPENDENT_AMBULATORY_CARE_PROVIDER_SITE_OTHER): Payer: Medicare HMO | Admitting: Internal Medicine

## 2022-06-02 VITALS — BP 136/82 | HR 57 | Temp 97.6°F | Resp 18 | Ht 69.0 in | Wt 228.1 lb

## 2022-06-02 DIAGNOSIS — E785 Hyperlipidemia, unspecified: Secondary | ICD-10-CM

## 2022-06-02 DIAGNOSIS — I10 Essential (primary) hypertension: Secondary | ICD-10-CM

## 2022-06-02 DIAGNOSIS — Z0000 Encounter for general adult medical examination without abnormal findings: Secondary | ICD-10-CM

## 2022-06-02 DIAGNOSIS — E1159 Type 2 diabetes mellitus with other circulatory complications: Secondary | ICD-10-CM

## 2022-06-02 NOTE — Patient Instructions (Addendum)
Reach out to the gastroenterology office [phone]) to discuss a colonoscopy  Check the  blood pressure regularly BP GOAL is between 110/65 and  135/85. If it is consistently higher or lower, let me know     GO TO THE FRONT DESK, PLEASE SCHEDULE YOUR APPOINTMENTS  Come back for blood work this week.  Come back for checkup in 6 months     "Health Care Power of attorney" ,  "Living will" (Advance care planning documents)  If you already have a living will or healthcare power of attorney, is recommended you bring the copy to be scanned in your chart.   The document will be available to all the doctors you see in the system.  Advance care planning is a process that supports adults in  understanding and sharing their preferences regarding future medical care.  The patient's preferences are recorded in documents called Advance Directives and the can be modified at any time while the patient is in full mental capacity.   If you don't have one, please consider create one.      More information at: [email or web address]

## 2022-06-02 NOTE — Progress Notes (Unsigned)
Subjective:    Patient ID: Jeffery Cline, male    DOB: 1945-05-07, 77 y.o.   MRN: [id number]  DOS:  06/02/2022 Type of visit - description: cpx  Here for CPX In general feeling well. Review of system is negative except for occasional nocturia without any other LUTS. Also, he hit his right great toe really hard few weeks ago, nail remains black.  Review of Systems  Other than above, a 14 point review of systems is negative     Past Medical History:  Diagnosis Date   Allergic rhinitis    Allergy    Basal cell carcinoma 2016   Right medial posterior shoulder, shave and ED&C   Cataract    forming    Decreased carotid pulse    decreased L carotid pulse (?) : Nl carotidu/s 02-2006     ED (erectile dysfunction)    GERD (gastroesophageal reflux disease)    Glaucoma    Hyperlipidemia    Hypertension    SCC (squamous cell carcinoma)    Sleep apnea    on CPAP    Past Surgical History:  Procedure Laterality Date   CYSTECTOMY     back, recurrent, last 01-2016   INGUINAL HERNIA REPAIR     right by dr. Magnus Ivan   MOHS SURGERY Left 11/2018   face, betwen nose-ear    PILONIDAL CYST EXCISION     POLYPECTOMY     TONSILLECTOMY     Social History   Socioeconomic History   Marital status: Married    Spouse name: Not on file   Number of children: 2   Years of education: Not on file   Highest education level: Not on file  Occupational History   Occupation: retired  Tobacco Use   Smoking status: Never   Smokeless tobacco: Never   Tobacco comments:    Socially in college.  Vaping Use   Vaping Use: Never used  Substance and Sexual Activity   Alcohol use: Not Currently    Comment:     Drug use: No   Sexual activity: Not Currently  Other Topics Concern   Not on file  Social History Narrative    Married. 2 sons ([place] and [place]) , 5 grandkids     Social Determinants of Health   Financial Resource Strain: Low Risk  (11/11/2020)   Overall Financial Resource Strain  (CARDIA)    Difficulty of Paying Living Expenses: Not hard at all  Food Insecurity: No Food Insecurity (11/11/2020)   Hunger Vital Sign    Worried About Running Out of Food in the Last Year: Never true    Ran Out of Food in the Last Year: Never true  Transportation Needs: No Transportation Needs (11/11/2020)   PRAPARE - Administrator, Civil Service (Medical): No    Lack of Transportation (Non-Medical): No  Physical Activity: Sufficiently Active (11/11/2020)   Exercise Vital Sign    Days of Exercise per Week: 7 days    Minutes of Exercise per Session: 40 min  Stress: No Stress Concern Present (11/11/2020)   [employer] of Occupational Health - Occupational Stress Questionnaire    Feeling of Stress : Not at all  Social Connections: Socially Integrated (11/11/2020)   Social Connection and Isolation Panel [NHANES]    Frequency of Communication with Friends and Family: More than three times a week    Frequency of Social Gatherings with Friends and Family: More than three times a week    Attends Religious  Services: More than 4 times per year    Active Member of Clubs or Organizations: Yes    Attends Banker Meetings: More than 4 times per year    Marital Status: Married  Catering manager Violence: Not At Risk (11/11/2020)   Humiliation, Afraid, Rape, and Kick questionnaire    Fear of Current or Ex-Partner: No    Emotionally Abused: No    Physically Abused: No    Sexually Abused: No    Current Outpatient Medications  Medication Instructions   amLODipine (NORVASC) 5 mg, Oral, Daily   aspirin EC 81 mg, Oral, Daily, Swallow whole.   atorvastatin (LIPITOR) 80 MG tablet TAKE 1 TABLET BY MOUTH EVERYDAY AT BEDTIME   cetirizine (ZYRTEC ALLERGY) 10 mg, Oral, Daily   COMBIGAN 0.2-0.5 % ophthalmic solution INSTILL 1 DROP IN BOTH EYES TWICE A DAY   fenofibrate (TRICOR) 145 mg, Oral, Daily   fluticasone (FLONASE) 50 MCG/ACT nasal spray 1 spray, Each Nare, Daily, Begin by  using 2 sprays in each nare daily for 3 to 5 days, then decrease to 1 spray in each nare daily.   ipratropium (ATROVENT) 0.03 % nasal spray 2 sprays, Each Nare, 2 times daily   metFORMIN (GLUCOPHAGE) 1,000 mg, Oral, 2 times daily with meals   metoprolol tartrate (LOPRESSOR) 50 mg, Oral, 2 times daily   nirmatrelvir & ritonavir (PAXLOVID, 300/100,) 20 x 150 MG & 10 x  TBPK Take 2 tablets nirmtrelvir and 1 tablet ritonavir twice daily.   pantoprazole (PROTONIX) 40 mg, Oral, Daily before breakfast, PRN   telmisartan (MICARDIS) 80 mg, Oral, Daily       Objective:   Physical Exam BP 136/82   Pulse (!) 57   Temp 97.6 F (36.4 C) (Oral)   Resp 18   Ht  (1.753 m)   Wt 228 lb 2 oz (103.5 kg)   SpO2 97%   BMI 33.69 kg/m  General: Well developed, NAD, BMI noted Neck: No  thyromegaly  HEENT:  Normocephalic . Face symmetric, atraumatic Lungs:  CTA B Normal respiratory effort, no intercostal retractions, no accessory muscle use. Heart: RRR,  no murmur.  Abdomen:  Not distended, soft, non-tender. No rebound or rigidity.   DM foot exam: Trace edema noted at the pretibial area.  Normal pedal pulses, pinprick examination negative. R Great toe nail: Black, slightly loose.  Skin not affected, normal in color Skin: Exposed areas without rash. Not pale. Not jaundice Neurologic:  alert & oriented X3.  Speech normal, gait appropriate for age and unassisted Strength symmetric and appropriate for age.  Psych: Cognition and judgment appear intact.  Cooperative with normal attention span and concentration.  Behavior appropriate. No anxious or depressed appearing.     Assessment    Assessment DM  A1c 6.5 (05-2016)  Obesity HTN Hyperlipidemia Coronary calcifications: Per CT, 01/2020 (Rx CV RF) Low testosterone: Saw endocrinology 06/2014 , mild hypogonadotropic hypogonadism. no rx recommended  Decreased libido , ED: not interested in Rx OSA -- on CPAP , rx Dr Vassie Loll GERD ( dx based  on response to cough to PPIs) Decreased L carotid pulse? Normal carotid ultrasound 2008 [facility] 2016- sees derm q year Dr Yetta Barre as off 05-2021 Glaucoma    BPH Chest 01/2020: Bronchiectasis.  Scattered solid pulmonary nodules stable.  Three-vessel CAD.  Hepatic steatosis.  Ectatic ascending thoracic aorta unchanged  PLAN: Here for CPX DM: On metformin, check A1c.  Feet exam negative. HTN: BP today is very good, continue amlodipine, metoprolol, Micardis.  Checking labs. Hyperlipidemia: Since the last visit, atorvastatin dose increased to 80 mg daily, also on Tricor.  Checking labs OSA bronchiectasis, pulmonary nodules: Doing well, no cough or difficulty breathing.  Good CPAP compliance GERD: Sporadic symptoms, on PPIs which he takes rarely CAD per CT: No symptoms, plan is to control CV RF, continue aspirin  Nail injury: He injured the right great toe few weeks ago, nail is still dark, loose, anticipate it will fall soon.  To let me know if that does not happen. RTC 6 months

## 2022-06-03 ENCOUNTER — Encounter: Payer: Self-pay | Admitting: Internal Medicine

## 2022-06-03 NOTE — Assessment & Plan Note (Signed)
Here for CPX DM: On metformin, check A1c.  Feet exam negative. HTN: BP today is very good, continue amlodipine, metoprolol, Micardis.  Checking labs. Hyperlipidemia: Since the last visit, atorvastatin dose increased to 80 mg daily, also on Tricor.  Checking labs OSA bronchiectasis, pulmonary nodules: Doing well, no cough or difficulty breathing.  Good CPAP compliance GERD: Sporadic symptoms, on PPIs which he takes rarely CAD per CT: No symptoms, plan is to control CV RF, continue aspirin  Nail injury: He injured the right great toe few weeks ago, nail is still dark, loose, anticipate it will fall soon.  To let me know if that does not happen. RTC 6 months

## 2022-06-03 NOTE — Assessment & Plan Note (Signed)
-   Td 2021 - pnm 23: 2012, 05/2020; Prevnar 2016 - Zostavax 02-2008 -shingrex  X 2 per pt  - COVID vaccine utd - s/p RSV vax --CCS Colonoscopy:  01/09/2002, normal Colonoscopy 04/2011, 2 polyps,  Cscope 02-2017, 5 years.  Plans to reach out to GI to see about doing a colonoscopy. --Prostate cancer screening: Not doing creatinines but in October 2023 C/O LUTS, PSA was normal.  Currently ASX  --diet-exercise: walks qd  --Labs: Will come back fasting for CMP FLP CBC A1c - ACP: See AVS

## 2022-06-04 ENCOUNTER — Other Ambulatory Visit (INDEPENDENT_AMBULATORY_CARE_PROVIDER_SITE_OTHER): Payer: Medicare HMO

## 2022-06-04 DIAGNOSIS — I10 Essential (primary) hypertension: Secondary | ICD-10-CM | POA: Diagnosis not present

## 2022-06-04 DIAGNOSIS — E1159 Type 2 diabetes mellitus with other circulatory complications: Secondary | ICD-10-CM

## 2022-06-04 DIAGNOSIS — E785 Hyperlipidemia, unspecified: Secondary | ICD-10-CM | POA: Diagnosis not present

## 2022-06-04 LAB — COMPREHENSIVE METABOLIC PANEL
ALT: 25 U/L (ref 0–53)
AST: 19 U/L (ref 0–37)
Albumin: 4.4 g/dL (ref 3.5–5.2)
Alkaline Phosphatase: 34 U/L — ABNORMAL LOW (ref 39–117)
BUN: 17 mg/dL (ref 6–23)
CO2: 25 mEq/L (ref 19–32)
Calcium: 9.1 mg/dL (ref 8.4–10.5)
Chloride: 106 mEq/L (ref 96–112)
Creatinine, Ser: 0.87 mg/dL (ref 0.40–1.50)
GFR: 83.53 mL/min (ref >60.00)
Glucose, Bld: 107 mg/dL — ABNORMAL HIGH (ref 70–99)
Potassium: 4 mEq/L (ref 3.5–5.1)
Sodium: 141 mEq/L (ref 135–145)
Total Bilirubin: 0.5 mg/dL (ref 0.2–1.2)
Total Protein: 6.7 g/dL (ref 6.0–8.3)

## 2022-06-04 LAB — CBC WITH DIFFERENTIAL/PLATELET
Basophils Absolute: 0.1 10*3/uL (ref 0.0–0.1)
Basophils Relative: 0.8 % (ref 0.0–3.0)
Eosinophils Absolute: 0.7 10*3/uL (ref 0.0–0.7)
Eosinophils Relative: 7.4 % — ABNORMAL HIGH (ref 0.0–5.0)
HCT: 42.9 % (ref 39.0–52.0)
Hemoglobin: 14.5 g/dL (ref 13.0–17.0)
Lymphocytes Relative: 26.4 % (ref 12.0–46.0)
Lymphs Abs: 2.7 10*3/uL (ref 0.7–4.0)
MCHC: 33.9 g/dL (ref 30.0–36.0)
MCV: 91.8 fl (ref 78.0–100.0)
Monocytes Absolute: 1 10*3/uL (ref 0.1–1.0)
Monocytes Relative: 10.2 % (ref 3.0–12.0)
Neutro Abs: 5.5 10*3/uL (ref 1.4–7.7)
Neutrophils Relative %: 55.2 % (ref 43.0–77.0)
Platelets: 210 10*3/uL (ref 150.0–400.0)
RBC: 4.68 Mil/uL (ref 4.22–5.81)
RDW: 13.9 % (ref 11.5–15.5)
WBC: 10.1 10*3/uL (ref 4.0–10.5)

## 2022-06-04 LAB — MICROALBUMIN / CREATININE URINE RATIO
Creatinine,U: 128.4 mg/dL
Microalb Creat Ratio: 1.3 mg/g (ref 0.0–30.0)
Microalb, Ur: 1.6 mg/dL (ref 0.0–1.9)

## 2022-06-04 LAB — LIPID PANEL
Cholesterol: 122 mg/dL (ref 0–200)
HDL: 27.2 mg/dL — ABNORMAL LOW (ref >39.00)
LDL Cholesterol: 70 mg/dL (ref 0–99)
NonHDL: 94.96
Total CHOL/HDL Ratio: 4
Triglycerides: 127 mg/dL (ref 0.0–149.0)
VLDL: 25.4 mg/dL (ref 0.0–40.0)

## 2022-06-04 LAB — HEMOGLOBIN A1C: Hgb A1c MFr Bld: 6.4 % (ref 4.6–6.5)

## 2022-07-09 ENCOUNTER — Other Ambulatory Visit: Payer: Self-pay | Admitting: Internal Medicine

## 2022-07-21 ENCOUNTER — Ambulatory Visit
Admission: EM | Admit: 2022-07-21 | Discharge: 2022-07-21 | Disposition: A | Discharge status: Home / Self Care | Payer: Medicare HMO | Attending: Nurse Practitioner | Admitting: Nurse Practitioner

## 2022-07-21 DIAGNOSIS — L089 Local infection of the skin and subcutaneous tissue, unspecified: Secondary | ICD-10-CM

## 2022-07-21 DIAGNOSIS — L729 Follicular cyst of the skin and subcutaneous tissue, unspecified: Secondary | ICD-10-CM | POA: Diagnosis not present

## 2022-07-21 MED ORDER — DOXYCYCLINE HYCLATE 100 MG PO CAPS: 100.0000 mg | ORAL_CAPSULE | Freq: Two times a day (BID) | ORAL | 0 refills | Status: AC

## 2022-07-21 NOTE — Discharge Instructions (Addendum)
Start doxycycline twice daily for 7 days warm compresses to the area as needed Once your infection resolves follow-up with your PCP for cyst removal Please go to the emergency room if you develop any worsening symptoms

## 2022-07-21 NOTE — ED Provider Notes (Signed)
[facility]    CSN: [id number] Arrival date & time: 07/21/22  1056      History   Chief Complaint Chief Complaint  Patient presents with   Abscess    HPI Jeffery Cline is a 77 y.o. male presents for evaluation of an infected cyst.  Patient has a history of cyst and reports over the past few days he has noticed redness and inflammation of a cyst on his left posterior shoulder.  He states the cyst has been there for years and does occasionally get infected and he has had to have an I&D in the past.  He denies any fevers or chills.  No active drainage.  No injury to the area.  Denies history of MRSA.  He has not used any OTC medications.  No other concerns at this time.   Abscess   Past Medical History:  Diagnosis Date   Allergic rhinitis    Allergy    Basal cell carcinoma 2016   Right medial posterior shoulder, shave and ED&C   Cataract    forming    Decreased carotid pulse    decreased L carotid pulse (?) : Nl carotidu/s 02-2006     ED (erectile dysfunction)    GERD (gastroesophageal reflux disease)    Glaucoma    Hyperlipidemia    Hypertension    SCC (squamous cell carcinoma)    Sleep apnea    on CPAP    Patient Active Problem List   Diagnosis Date Noted   Chronic rhinitis 07/22/2021   Diabetes (HCC) 12/16/2020   Bronchiectasis without complication (HCC) 01/31/2020   SCC (squamous cell carcinoma) 12/14/2019   Glaucoma 06/05/2019   Macular degeneration 06/05/2019   Pulmonary nodule seen on imaging study 01/23/2019   BCC (basal cell carcinoma of skin) 12/05/2018   Cervical spondylosis without myelopathy 06/20/2018   GERD (gastroesophageal reflux disease) 09/05/2015   PCP NOTES >>>>>>>>>> 04/30/2015   Morbid obesity (HCC) 04/30/2015   OSA (obstructive sleep apnea) 12/06/2014   Low serum testosterone level 06/20/2014   Fatigue 06/04/2014   Personal history of colonic polyps - adenomas 04/21/2011   Annual physical exam 01/20/2011   Cough  08/29/2010   ERECTILE DYSFUNCTION, ORGANIC 10/09/2009   Hyperlipidemia 06/29/2006   Essential hypertension 06/29/2006    Past Surgical History:  Procedure Laterality Date   CYSTECTOMY     back, recurrent, last 01-2016   INGUINAL HERNIA REPAIR     right by dr. Magnus Ivan   MOHS SURGERY Left 11/2018   face, betwen nose-ear    PILONIDAL CYST EXCISION     POLYPECTOMY     TONSILLECTOMY         Home Medications    Prior to Admission medications   Medication Sig Start Date End Date Taking? Authorizing Provider  doxycycline (VIBRAMYCIN) 100 MG capsule Take 1 capsule (100 mg total) by mouth 2 (two) times daily for 7 days. 07/21/22 07/28/22 Yes Radford Pax, NP  amLODipine (NORVASC) 5 MG tablet Take 1 tablet (5 mg total) by mouth daily. 07/09/22   Wanda Plump, MD  aspirin EC 81 MG tablet Take 81 mg by mouth daily. Swallow whole.    [provider]  atorvastatin (LIPITOR) 80 MG tablet TAKE 1 TABLET BY MOUTH EVERYDAY AT BEDTIME 05/17/22   Worthy Rancher B, FNP  cetirizine (ZYRTEC ALLERGY) 10 MG tablet Take 1 tablet (10 mg total) by mouth daily. 02/01/22   Wallis Bamberg, PA-C  COMBIGAN 0.2-0.5 % ophthalmic solution INSTILL 1  DROP IN BOTH EYES TWICE A DAY 03/14/15   [provider]  fenofibrate (TRICOR) 145 MG tablet Take 1 tablet (145 mg total) by mouth daily. 11/19/21   Wanda Plump, MD  fluticasone [facility]) 50 MCG/ACT nasal spray Place 1 spray into both nostrils daily. Begin by using 2 sprays in each nare daily for 3 to 5 days, then decrease to 1 spray in each nare daily. 12/12/21   Theadora Rama Scales, PA-C  ipratropium (ATROVENT) 0.03 % nasal spray Place 2 sprays into both nostrils 2 (two) times daily. 02/01/22   Wallis Bamberg, PA-C  metFORMIN (GLUCOPHAGE) 1000 MG tablet Take 1 tablet (1,000 mg total) by mouth 2 (two) times daily with a meal. 04/27/22   Wanda Plump, MD  metoprolol tartrate (LOPRESSOR) 50 MG tablet Take 1 tablet (50 mg total) by mouth 2 (two) times daily. 07/09/22    Wanda Plump, MD  nirmatrelvir & ritonavir (PAXLOVID, 300/100,) 20 x 150 MG & 10 x 100MG  TBPK Take 2 tablets nirmtrelvir and 1 tablet ritonavir twice daily. Patient not taking: Reported on 06/02/2022 02/01/22   Wallis Bamberg, PA-C  pantoprazole (PROTONIX) 40 MG tablet Take 40 mg by mouth daily before breakfast. PRN 06/07/20   Wanda Plump, MD  telmisartan (MICARDIS) 80 MG tablet Take 1 tablet (80 mg total) by mouth daily. 07/09/22   Wanda Plump, MD    Family History Family History  Problem Relation Age of Onset   Dementia Mother    Alcohol abuse Mother    Coronary artery disease Father 54   Coronary artery disease Maternal Grandfather    Leukemia Paternal Grandfather    Colon cancer Neg Hx    Prostate cancer Neg Hx    Colon polyps Neg Hx    Rectal cancer Neg Hx    Stomach cancer Neg Hx     Social History Social History   Tobacco Use   Smoking status: Never   Smokeless tobacco: Never   Tobacco comments:    Socially in college.  Vaping Use   Vaping Use: Never used  Substance Use Topics   Alcohol use: Not Currently    Comment:     Drug use: No     Allergies   Patient has no known allergies.   Review of Systems Review of Systems  Skin:        Infected cyst of left shoulder     Physical Exam Triage Vital Signs ED Triage Vitals [07/21/22 1113]  Enc Vitals Group     BP (!) 148/80     Pulse Rate (!) 55     Resp 18     Temp 98.4 F (36.9 C)     Temp Source Oral     SpO2 94 %     Weight      Height      Head Circumference      Peak Flow      Pain Score 3     Pain Loc      Pain Edu?      Excl. in GC?    No data found.  Updated Vital Signs BP (!) 148/80 (BP Location: Right Arm)   Pulse (!) 55   Temp 98.4 F (36.9 C) (Oral)   Resp 18   SpO2 94%   Visual Acuity Right Eye Distance:   Left Eye Distance:   Bilateral Distance:    Right Eye Near:   Left Eye Near:    Bilateral Near:  Physical Exam Vitals and nursing note reviewed.  Constitutional:       Appearance: Normal appearance.  HENT:     Head: Normocephalic and atraumatic.  Eyes:     Pupils: Pupils are equal, round, and reactive to light.  Cardiovascular:     Rate and Rhythm: Normal rate.  Pulmonary:     Effort: Pulmonary effort is normal.  Skin:    General: Skin is warm and dry.          Comments: 2 x 2 inflamed epidermoid cyst to the left posterior shoulder.  Erythema and warmth with mild fluctuance and induration.  No active drainage  Neurological:     General: No focal deficit present.     Mental Status: He is alert and oriented to person, place, and time.  Psychiatric:        Mood and Affect: Mood normal.        Behavior: Behavior normal.      UC Treatments / Results  Labs (all labs ordered are listed, but only abnormal results are displayed) Labs Reviewed - No data to display  EKG   Radiology No results found.  Procedures Incision and Drainage  Date/Time: 07/21/2022 11:45 AM  Performed by: Radford Pax, NP Authorized by: Radford Pax, NP   Consent:    Consent obtained:  Verbal   Consent given by:  Patient   Risks, benefits, and alternatives were discussed: yes     Risks discussed:  Bleeding, incomplete drainage, pain and infection   Alternatives discussed:  Referral and alternative treatment Universal protocol:    Patient identity confirmed:  Verbally with patient Location:    Type:  Cyst   Size:  2 x 2   Location: Left shoulder. Pre-procedure details:    Skin preparation:  Chlorhexidine Sedation:    Sedation type:  None Anesthesia:    Anesthesia method:  Local infiltration   Local anesthetic:  Lidocaine 2% WITH epi Procedure type:    Complexity:  Simple Procedure details:    Incision types:  Stab incision   Incision depth:  Subcutaneous   Wound management:  Probed and deloculated   Drainage:  Purulent   Drainage amount:  Moderate   Wound treatment:  Wound left open   Packing materials:  None Post-procedure details:     Procedure completion:  Tolerated well, no immediate complications  (including critical care time)  Medications Ordered in UC Medications - No data to display  Initial Impression / Assessment and Plan / UC Course  I have reviewed the triage vital signs and the nursing notes.  Pertinent labs & imaging results that were available during my care of the patient were reviewed by me and considered in my medical decision making (see chart for details).     Wound care reviewed.  Start doxycycline Warm compresses to the area as needed.  Advised patient once infection resolved follow-up with PCP for cyst removal as it continues to get reinfected and patient verbalized understanding ER precautions reviewed and patient verbalized understanding Final Clinical Impressions(s) / UC Diagnoses   Final diagnoses:  Infected cyst of skin     Discharge Instructions      Start doxycycline twice daily for 7 days warm compresses to the area as needed Once your infection resolves follow-up with your PCP for cyst removal Please go to the emergency room if you develop any worsening symptoms    ED Prescriptions     Medication Sig Dispense Auth. Provider   doxycycline (  VIBRAMYCIN) 100 MG capsule Take 1 capsule (100 mg total) by mouth 2 (two) times daily for 7 days. 14 capsule Radford Pax, NP      PDMP not reviewed this encounter.   Radford Pax, NP 07/21/22 1146

## 2022-07-21 NOTE — ED Triage Notes (Signed)
Pt presents with c/o an abscess on the upper lt shoulder X few months.  States was here and had the abscess drained. Would like for it to be drained again.

## 2022-07-27 ENCOUNTER — Ambulatory Visit (INDEPENDENT_AMBULATORY_CARE_PROVIDER_SITE_OTHER): Payer: Medicare HMO | Admitting: Adult Health

## 2022-07-27 ENCOUNTER — Encounter: Payer: Self-pay | Admitting: Adult Health

## 2022-07-27 ENCOUNTER — Ambulatory Visit (INDEPENDENT_AMBULATORY_CARE_PROVIDER_SITE_OTHER): Payer: Medicare HMO

## 2022-07-27 VITALS — BP 108/60 | HR 63 | Ht 70.0 in | Wt 224.2 lb

## 2022-07-27 DIAGNOSIS — R911 Solitary pulmonary nodule: Secondary | ICD-10-CM | POA: Diagnosis not present

## 2022-07-27 DIAGNOSIS — J310 Chronic rhinitis: Secondary | ICD-10-CM

## 2022-07-27 DIAGNOSIS — J479 Bronchiectasis, uncomplicated: Secondary | ICD-10-CM | POA: Diagnosis not present

## 2022-07-27 DIAGNOSIS — G4733 Obstructive sleep apnea (adult) (pediatric): Secondary | ICD-10-CM | POA: Diagnosis not present

## 2022-07-27 NOTE — Assessment & Plan Note (Signed)
Controlled without flare   Plan  Patient Instructions  Continue on CPAP At bedtime  .  Work on healthy weight .  Do not drive if sleepy .  GERD diet  Protonix daily as needed for reflux .  Continue on Claritin , Astelin and Flonase daily As needed   Mucinex DM As needed  Cough/congestion .  Chest xray today .  Follow up with Dr. Vassie Loll or Rudell Ortman NP  In 1 year  and As needed

## 2022-07-27 NOTE — Assessment & Plan Note (Signed)
CT chest 2021 showed some tree-in-bud opacities in the right upper lobe with associated bronchiectasis.  Patient did have some stable scattered pulmonary nodules up to 5 mm.  Patient is a never smoker.  These appear to be benign in etiology.  Patient has no active symptoms.  We will continue to follow clinically. Check chest xray today.

## 2022-07-27 NOTE — Addendum Note (Signed)
Addended by: Delrae Rend on: 07/27/2022 09:45 AM   Modules accepted: Orders

## 2022-07-27 NOTE — Patient Instructions (Addendum)
Continue on CPAP At bedtime  .  Work on healthy weight .  Do not drive if sleepy .  GERD diet  Protonix daily as needed for reflux .  Continue on Claritin , Astelin and Flonase daily As needed   Mucinex DM As needed  Cough/congestion .  Chest xray today .  Follow up with Dr. Vassie Loll or Skylinn Vialpando NP  In 1 year  and As needed

## 2022-07-27 NOTE — Assessment & Plan Note (Signed)
Bronchiectasis noted on CT chest , no significant symptom burden. Hold on PFT as asymptomatic. Last CT chest 2021 stable. Check Chest xray today.  Control for triggers   Plan  Patient Instructions  Continue on CPAP At bedtime  .  Work on healthy weight .  Do not drive if sleepy .  GERD diet  Protonix daily as needed for reflux .  Continue on Claritin , Astelin and Flonase daily As needed   Mucinex DM As needed  Cough/congestion .  Chest xray today .  Follow up with Dr. Vassie Loll or Sidonia Nutter NP  In 1 year  and As needed

## 2022-07-27 NOTE — Progress Notes (Signed)
@Patient  ID: Jeffery Cline, male    DOB: 1945-11-13, 77 y.o.   MRN: [id number]  Chief Complaint  Patient presents with   Follow-up    Referring provider: Wanda Plump, MD  HPI: 77 year old male never smoker followed for obstructive sleep apnea, bronchiectasis and lung nodules (stable on serial CT chest ) , allergic rhinitis    TEST/EVENTS :  HST 07/2014 showed moderate OSA with AHI 27/hour and lowest desaturation of 79%. Total sleep time was 8 hours.   CT chest January 11, 2019 clustered tree-in-bud nodularity right upper lobe, scattered tiny bilateral pulmonary nodules measuring up to 5 mm.   CT chest done January 22, 2020 showed tree-in-bud opacities in the right upper lobe mildly increased with associated bronchiectasis.  Stable scattered pulmonary nodules measuring up to 5 mm consistent with benign etiology  07/27/2022 Follow up : Sleep apnea, bronchiectasis, lung nodules, allergic rhinitis Patient presents for a 1 year follow-up.  Patient has underlying moderate obstructive sleep apnea is on nocturnal CPAP.  Patient says he is doing well on CPAP.  Wears it every single night.  Cannot sleep without it.  Feels that he benefits from CPAP with decreased daytime sleepiness.  CPAP download shows excellent compliance with 100% usage.  Daily average usage at 8 hours.  Patient is on CPAP at 8 cm H2O.  AHI 0.9/hour. Using full face. Is very excited how well he is sleeping. Got new CPAP , working well. Using [employer] for DME - they work very well for him.   Patient identifies some bronchiectasis and pulmonary nodules noted on CT scan.  He has no significant symptom burden.. Never smoker.  Serial CT December 2021 showed tree-in-bud opacities in the right upper lobe with associated bronchiectasis and stable scattered pulmonary nodules measuring up to 5 mm consistent with a benign etiology.  Patient denies any shortness of breath, cough, weight loss or hemoptysis.  Has chronic rhinitis.  Uses  Claritin, Astelin and Flonase as needed.  Remains very active.  Walks daily every day 2 miles at 6 am.   Denies any flare of symptoms. No recent flare.   Has intermittent reflux.  Rarely has to use any medication.  Does have Protonix if he needs it.  Retired from [employer], Camera operator. Enjoys retirement. Went to [place] , From [place]. Likes Travel.       No Known Allergies  Immunization History  Administered Date(s) Administered   Fluad Quad(high Dose 65+) 11/17/2021   Influenza Split 11/02/2013   Influenza Whole 11/23/2007, 10/09/2009, 11/09/2012   Influenza, High Dose Seasonal PF 11/18/2016, 09/26/2018, 11/22/2019, 11/14/2020   Influenza,inj,Quad PF,6+ Mos 12/06/2014   Influenza-Unspecified 11/15/2015, 11/26/2017   Moderna Covid-19 Vaccine Bivalent Booster 33yrs & up 07/18/2021, 11/19/2021   Moderna Sars-Covid-2 Vaccination 03/06/2019, 04/03/2019, 12/19/2019, 05/20/2020   Pfizer Covid-19 Vaccine Bivalent Booster 95yrs & up 10/30/2020   Pneumococcal Conjugate-13 04/02/2014   Pneumococcal Polysaccharide-23 01/20/2011, 06/07/2020   Respiratory Syncytial Virus Vaccine,Recomb Aduvanted(Arexvy) 04/07/2022   Td 06/07/1998, 09/18/2008   Tdap 02/15/2019   Zoster Recombinat (Shingrix) 02/15/2019, 10/15/2021   Zoster, Live 02/21/2008    Past Medical History:  Diagnosis Date   Allergic rhinitis    Allergy    Basal cell carcinoma 2016   Right medial posterior shoulder, shave and ED&C   Cataract    forming    Decreased carotid pulse    decreased L carotid pulse (?) : Nl carotidu/s 02-2006     ED (erectile dysfunction)    GERD (gastroesophageal  reflux disease)    Glaucoma    Hyperlipidemia    Hypertension    SCC (squamous cell carcinoma)    Sleep apnea    on CPAP    Tobacco History: Social History   Tobacco Use  Smoking Status Never  Smokeless Tobacco Never  Tobacco Comments   Socially in college.   Counseling given: Not Answered Tobacco comments: Socially in  college.   Outpatient Medications Prior to Visit  Medication Sig Dispense Refill   amLODipine (NORVASC) 5 MG tablet Take 1 tablet (5 mg total) by mouth daily. 90 tablet 1   aspirin EC 81 MG tablet Take 81 mg by mouth daily. Swallow whole.     atorvastatin (LIPITOR) 80 MG tablet TAKE 1 TABLET BY MOUTH EVERYDAY AT BEDTIME 90 tablet 1   cetirizine (ZYRTEC ALLERGY) 10 MG tablet Take 1 tablet (10 mg total) by mouth daily. 30 tablet 0   COMBIGAN 0.2-0.5 % ophthalmic solution INSTILL 1 DROP IN BOTH EYES TWICE A DAY  3   doxycycline (VIBRAMYCIN) 100 MG capsule Take 1 capsule (100 mg total) by mouth 2 (two) times daily for 7 days. 14 capsule 0   fenofibrate (TRICOR) 145 MG tablet Take 1 tablet (145 mg total) by mouth daily. 90 tablet 1   fluticasone (FLONASE) 50 MCG/ACT nasal spray Place 1 spray into both nostrils daily. Begin by using 2 sprays in each nare daily for 3 to 5 days, then decrease to 1 spray in each nare daily. 15.8 mL 2   ipratropium (ATROVENT) 0.03 % nasal spray Place 2 sprays into both nostrils 2 (two) times daily. 30 mL 0   metFORMIN (GLUCOPHAGE) 1000 MG tablet Take 1 tablet (1,000 mg total) by mouth 2 (two) times daily with a meal. 180 tablet 1   metoprolol tartrate (LOPRESSOR) 50 MG tablet Take 1 tablet (50 mg total) by mouth 2 (two) times daily. 180 tablet 1   pantoprazole (PROTONIX) 40 MG tablet Take 40 mg by mouth daily before breakfast. PRN     telmisartan (MICARDIS) 80 MG tablet Take 1 tablet (80 mg total) by mouth daily. 90 tablet 1   nirmatrelvir & ritonavir (PAXLOVID, 300/100,) 20 x 150 MG & 10 x 100MG  TBPK Take 2 tablets nirmtrelvir and 1 tablet ritonavir twice daily. (Patient not taking: Reported on 06/02/2022) 30 tablet 0   No facility-administered medications prior to visit.    Review of Systems:   Constitutional:   No  weight loss, night sweats,  Fevers, chills, fatigue, or  lassitude.  HEENT:   No headaches,  Difficulty swallowing,  Tooth/dental problems, or  Sore  throat,                No sneezing, itching, ear ache, nasal congestion, post nasal drip,   CV:  No chest pain,  Orthopnea, PND, swelling in lower extremities, anasarca, dizziness, palpitations, syncope.   GI  No heartburn, indigestion, abdominal pain, nausea, vomiting, diarrhea, change in bowel habits, loss of appetite, bloody stools.   Resp: No shortness of breath with exertion or at rest.  No excess mucus, no productive cough,  No non-productive cough,  No coughing up of blood.  No change in color of mucus.  No wheezing.  No chest wall deformity  Skin: no rash or lesions.  GU: no dysuria, change in color of urine, no urgency or frequency.  No flank pain, no hematuria   MS:  No joint pain or swelling.  No decreased range of motion.  No back  pain.    Physical Exam  BP 108/60 (BP Location: Left Arm, Patient Position: Sitting, Cuff Size: Large)   Pulse 63   Ht 5\' 10"  (1.778 m)   Wt 224 lb 3.2 oz (101.7 kg)   SpO2 95%   BMI 32.17 kg/m   GEN: A/Ox3; pleasant , NAD, well nourished    HEENT:  [place]/AT,   NOSE-clear, THROAT-clear, no lesions, no postnasal drip or exudate noted.   NECK:  Supple w/ fair ROM; no JVD; normal carotid impulses w/o bruits; no thyromegaly or nodules palpated; no lymphadenopathy.    RESP  Clear  P & A; w/o, wheezes/ rales/ or rhonchi. no accessory muscle use, no dullness to percussion  CARD:  RRR, no m/r/g, tr peripheral edema, pulses intact, no cyanosis or clubbing.  GI:   Soft & nt; nml bowel sounds; no organomegaly or masses detected.   Musco: Warm bil, no deformities or joint swelling noted.   Neuro: alert, no focal deficits noted.    Skin: Warm, no lesions or rashes      BNP No results found for: "BNP"  ProBNP No results found for: "PROBNP"  Imaging: No results found.        No data to display          No results found for: "NITRICOXIDE"      Assessment & Plan:   Bronchiectasis without complication (HCC) Bronchiectasis  noted on CT chest , no significant symptom burden. Hold on PFT as asymptomatic. Last CT chest 2021 stable. Check Chest xray today.  Control for triggers   Plan  Patient Instructions  Continue on CPAP At bedtime  .  Work on healthy weight .  Do not drive if sleepy .  GERD diet  Protonix daily as needed for reflux .  Continue on Claritin , Astelin and Flonase daily As needed   Mucinex DM As needed  Cough/congestion .  Chest xray today .  Follow up with Dr. Vassie Loll or Lela Gell NP  In 1 year  and As needed      Chronic rhinitis Controlled without flare   Plan  Patient Instructions  Continue on CPAP At bedtime  .  Work on healthy weight .  Do not drive if sleepy .  GERD diet  Protonix daily as needed for reflux .  Continue on Claritin , Astelin and Flonase daily As needed   Mucinex DM As needed  Cough/congestion .  Chest xray today .  Follow up with Dr. Vassie Loll or Karielle Davidow NP  In 1 year  and As needed      OSA (obstructive sleep apnea) Excellent control on CPAP   Plan  Patient Instructions  Continue on CPAP At bedtime  .  Work on healthy weight .  Do not drive if sleepy .  GERD diet  Protonix daily as needed for reflux .  Continue on Claritin , Astelin and Flonase daily As needed   Mucinex DM As needed  Cough/congestion .  Chest xray today .  Follow up with Dr. Vassie Loll or Lorain Fettes NP  In 1 year  and As needed      Pulmonary nodule seen on imaging study CT chest 2021 showed some tree-in-bud opacities in the right upper lobe with associated bronchiectasis.  Patient did have some stable scattered pulmonary nodules up to 5 mm.  Patient is a never smoker.  These appear to be benign in etiology.  Patient has no active symptoms.  We will continue to follow clinically. Check chest  xray today.      Rubye Oaks, NP 07/27/2022

## 2022-07-27 NOTE — Assessment & Plan Note (Signed)
Excellent control on CPAP   Plan  Patient Instructions  Continue on CPAP At bedtime  .  Work on healthy weight .  Do not drive if sleepy .  GERD diet  Protonix daily as needed for reflux .  Continue on Claritin , Astelin and Flonase daily As needed   Mucinex DM As needed  Cough/congestion .  Chest xray today .  Follow up with Dr. Vassie Loll or Louellen Haldeman NP  In 1 year  and As needed

## 2022-08-07 IMAGING — DX DG CHEST 2V
2 series · 2 of 2 positions shown · non-contrast
Comparison: Chest x-ray 01/02/2019.

CLINICAL DATA: 74-year-old male with history of persistent cough
for the past 3 weeks.

EXAM:
CHEST - 2 VIEW

[chest pa]
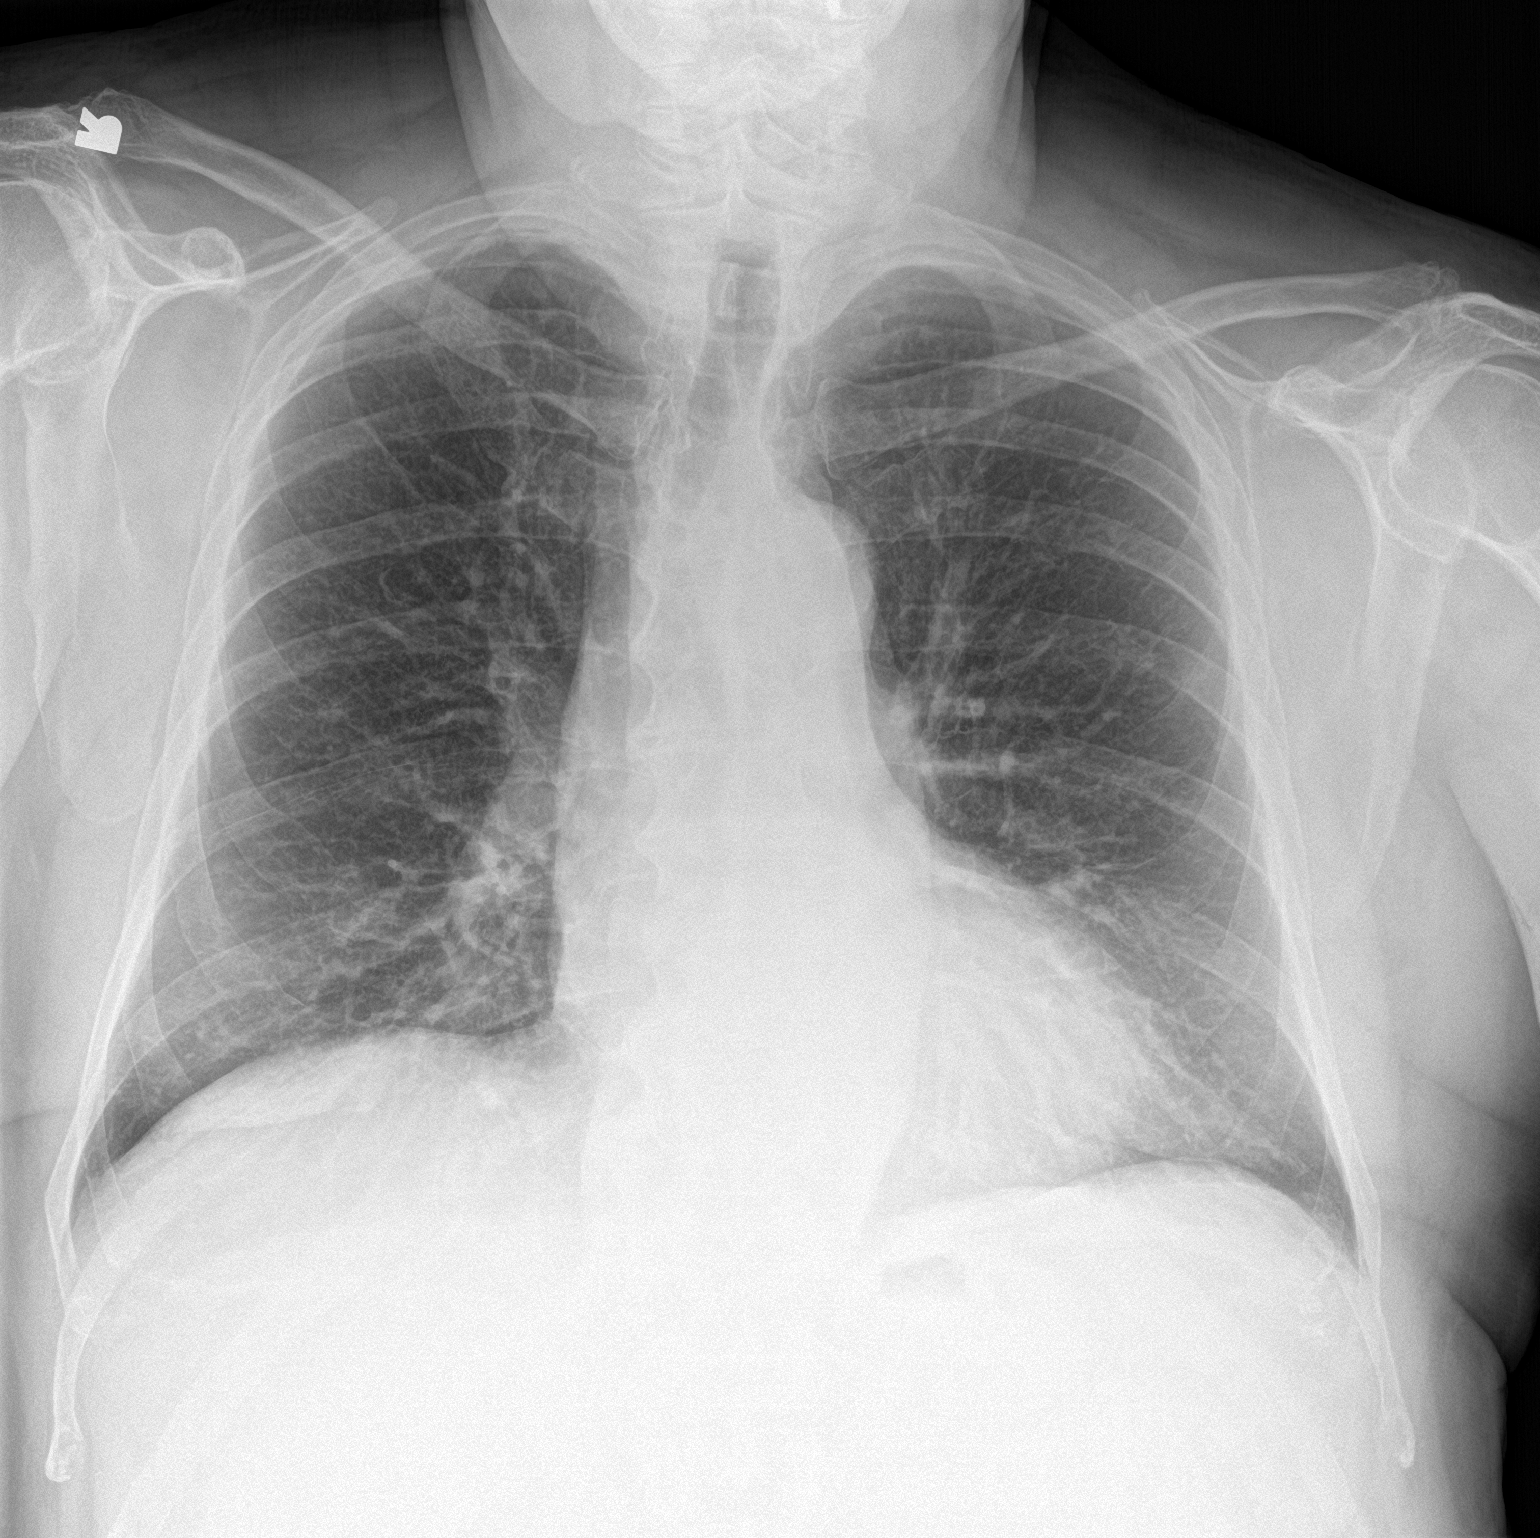

[chest lat]
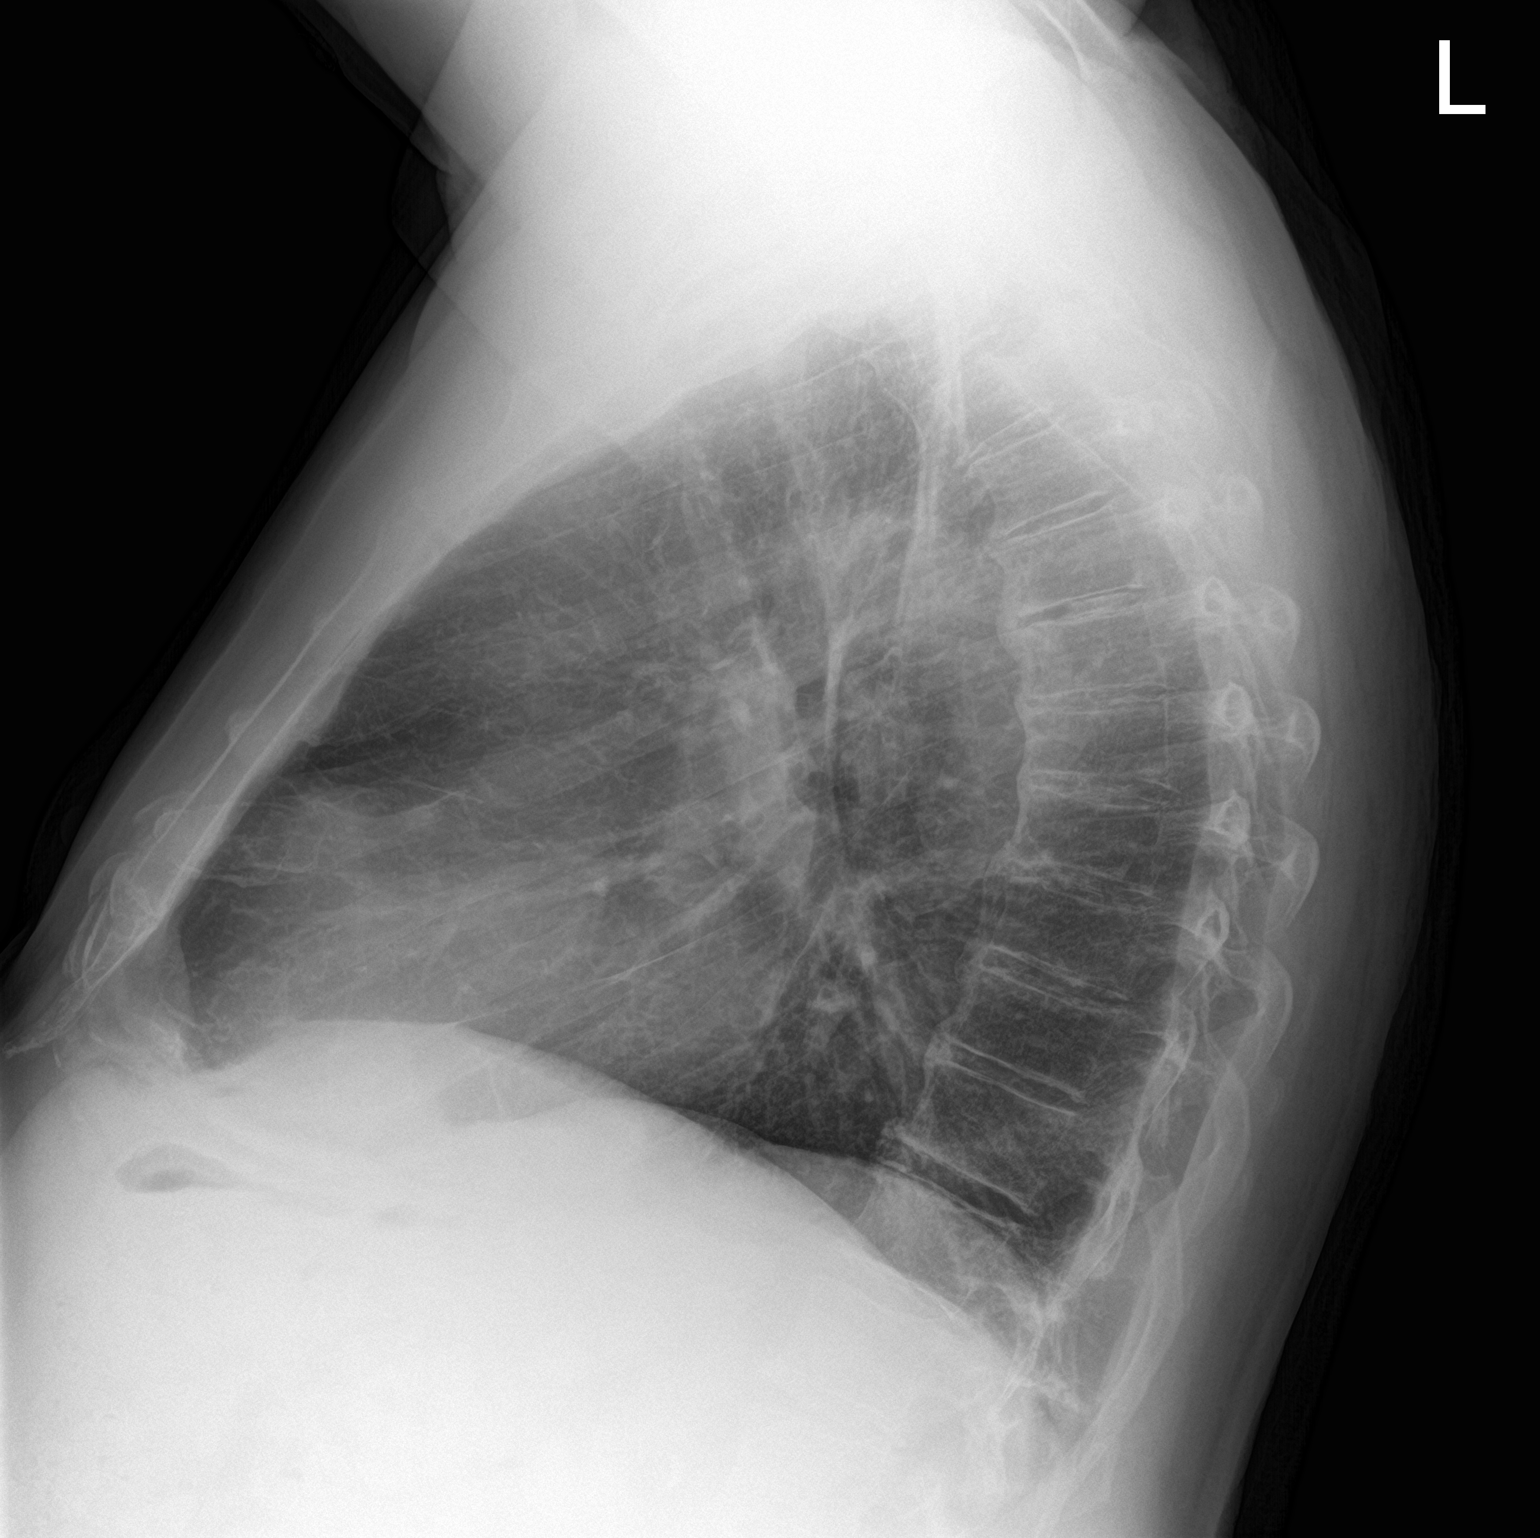

[2 of 2 positions shown; findings below may reference images not displayed]

FINDINGS: Lung volumes are normal. No consolidative airspace disease. No
pleural effusions. No pneumothorax. No pulmonary nodule or mass
noted. Pulmonary vasculature and the cardiomediastinal silhouette
are within normal limits.
IMPRESSION: No radiographic evidence of acute cardiopulmonary disease.

## 2022-09-11 ENCOUNTER — Other Ambulatory Visit: Payer: Self-pay | Admitting: Family

## 2022-10-12 ENCOUNTER — Other Ambulatory Visit: Payer: Self-pay | Admitting: Internal Medicine

## 2022-10-16 ENCOUNTER — Other Ambulatory Visit: Payer: Self-pay | Admitting: Internal Medicine

## 2022-12-02 ENCOUNTER — Encounter: Payer: Self-pay | Admitting: Internal Medicine

## 2022-12-02 ENCOUNTER — Ambulatory Visit: Payer: Medicare HMO | Admitting: Internal Medicine

## 2022-12-02 VITALS — BP 130/70 | HR 52 | Temp 97.6°F | Resp 16 | Ht 70.0 in | Wt 231.4 lb

## 2022-12-02 DIAGNOSIS — I10 Essential (primary) hypertension: Secondary | ICD-10-CM | POA: Diagnosis not present

## 2022-12-02 DIAGNOSIS — E785 Hyperlipidemia, unspecified: Secondary | ICD-10-CM | POA: Diagnosis not present

## 2022-12-02 DIAGNOSIS — Z23 Encounter for immunization: Secondary | ICD-10-CM | POA: Diagnosis not present

## 2022-12-02 DIAGNOSIS — E1159 Type 2 diabetes mellitus with other circulatory complications: Secondary | ICD-10-CM

## 2022-12-02 DIAGNOSIS — Z7984 Long term (current) use of oral hypoglycemic drugs: Secondary | ICD-10-CM | POA: Diagnosis not present

## 2022-12-02 LAB — BASIC METABOLIC PANEL
BUN: 16 mg/dL (ref 6–23)
CO2: 27 meq/L (ref 19–32)
Calcium: 9.7 mg/dL (ref 8.4–10.5)
Chloride: 104 meq/L (ref 96–112)
Creatinine, Ser: 0.88 mg/dL (ref 0.40–1.50)
GFR: 82.95 mL/min (ref >60.00)
Glucose, Bld: 106 mg/dL — ABNORMAL HIGH (ref 70–99)
Potassium: 4.1 meq/L (ref 3.5–5.1)
Sodium: 140 meq/L (ref 135–145)

## 2022-12-02 LAB — HEMOGLOBIN A1C: Hgb A1c MFr Bld: 6.5 % (ref 4.6–6.5)

## 2022-12-02 NOTE — Patient Instructions (Addendum)
Reach out to gastroenterology, colonoscopy? [phone]     GO TO THE LAB : Get the blood work     Next visit with me by April 2025, complete physical exam. Please schedule it at the front desk      Per our records you are soon due for your diabetic eye exam. Please contact your eye doctor to schedule an appointment. Please have them send copies of your office visit notes to [place]. Our fax number is [phone]. If you need a referral to an eye doctor please let us know.

## 2022-12-02 NOTE — Assessment & Plan Note (Signed)
DM: Last A1c satisfactory, continue metformin, check a BMP and A1c.  He follows a good diet, takes a daily walk. HTN: ambulatory BPs are okay.  Continue amlodipine, metoprolol, Micardis. Bronchiectasis:Saw pulmonary few months ago, chest x-ray okay. Preventive care: Up-to-date on COVID and flu vaccines. Due for colonoscopy, he is 77 y/o,  doing very well, recommend to discuss w/ GI  future colonoscopies. RTC 6 months CPX

## 2022-12-02 NOTE — Progress Notes (Signed)
Subjective:    Patient ID: Jeffery Cline, male    DOB: [birth year], 77 y.o.   MRN: [id number]  DOS:  12/02/2022 Type of visit - description: Follow-up  Since the last office visit is doing well. Ambulatory BPs check at the pharmacy: Always okay. Diet is healthy, takes daily walks.  Review of Systems See above   Past Medical History:  Diagnosis Date   Allergic rhinitis    Allergy    Basal cell carcinoma 2016   Right medial posterior shoulder, shave and ED&C   Cataract    forming    Decreased carotid pulse    decreased L carotid pulse (?) : Nl carotidu/s 02-2006     ED (erectile dysfunction)    GERD (gastroesophageal reflux disease)    Glaucoma    Hyperlipidemia    Hypertension    SCC (squamous cell carcinoma)    Sleep apnea    on CPAP    Past Surgical History:  Procedure Laterality Date   CYSTECTOMY     back, recurrent, last 01-2016   INGUINAL HERNIA REPAIR     right by dr. Magnus Ivan   MOHS SURGERY Left 11/2018   face, betwen nose-ear    PILONIDAL CYST EXCISION     POLYPECTOMY     TONSILLECTOMY      Current Outpatient Medications  Medication Instructions   amLODipine (NORVASC) 5 mg, Oral, Daily   aspirin EC 81 mg, Oral, Daily, Swallow whole.   atorvastatin (LIPITOR) 80 MG tablet TAKE 1 TABLET BY MOUTH EVERYDAY AT BEDTIME   cetirizine (ZYRTEC ALLERGY) 10 mg, Oral, Daily   COMBIGAN 0.2-0.5 % ophthalmic solution INSTILL 1 DROP IN BOTH EYES TWICE A DAY   fenofibrate (TRICOR) 145 mg, Oral, Daily   fluticasone (FLONASE) 50 MCG/ACT nasal spray 1 spray, Each Nare, Daily, Begin by using 2 sprays in each nare daily for 3 to 5 days, then decrease to 1 spray in each nare daily.   ipratropium (ATROVENT) 0.03 % nasal spray 2 sprays, Each Nare, 2 times daily   metFORMIN (GLUCOPHAGE) 1,000 mg, Oral, 2 times daily with meals   metoprolol tartrate (LOPRESSOR) 50 mg, Oral, 2 times daily   pantoprazole (PROTONIX) 40 mg, Oral, Daily before breakfast, PRN   telmisartan  (MICARDIS) 80 mg, Oral, Daily       Objective:   Physical Exam BP 130/70   Pulse (!) 52   Temp 97.6 F (36.4 C) (Oral)   Resp 16   Ht 5\' 10"  (1.778 m)   Wt 231 lb 6 oz (105 kg)   SpO2 97%   BMI 33.20 kg/m  General:   Well developed, NAD, BMI noted. HEENT:  Normocephalic . Face symmetric, atraumatic Lungs:  CTA B Normal respiratory effort, no intercostal retractions, no accessory muscle use. Heart: RRR,  no murmur.  Lower extremities: no pretibial edema bilaterally  Skin: Not pale. Not jaundice Neurologic:  alert & oriented X3.  Speech normal, gait appropriate for age and unassisted Psych--  Cognition and judgment appear intact.  Cooperative with normal attention span and concentration.  Behavior appropriate. No anxious or depressed appearing.      Assessment   Assessment DM  A1c 6.5 (05-2016)  Obesity HTN Hyperlipidemia Coronary calcifications: Per CT, 01/2020 (Rx CV RF) Low testosterone: Saw endocrinology 06/2014 , mild hypogonadotropic hypogonadism. no rx recommended  Decreased libido , ED: not interested in Rx OSA -- on CPAP , rx Dr Vassie Loll GERD ( dx based on response to cough to PPIs) Decreased  L carotid pulse? Normal carotid ultrasound 2008 [facility] 2016- sees derm q year Dr Yetta Barre as off 05-2021 Glaucoma    BPH Chest 01/2020: Bronchiectasis.  Scattered solid pulmonary nodules stable.  Three-vessel CAD.  Hepatic steatosis.  Ectatic ascending thoracic aorta unchanged  PLAN: DM: Last A1c satisfactory, continue metformin, check a BMP and A1c.  He follows a good diet, takes a daily walk. HTN: ambulatory BPs are okay.  Continue amlodipine, metoprolol, Micardis. Bronchiectasis:Saw pulmonary few months ago, chest x-ray okay. Preventive care: Up-to-date on COVID and flu vaccines. Due for colonoscopy, he is 77 y/o,  doing very well, recommend to discuss w/ GI  future colonoscopies. RTC 6 months CPX

## 2022-12-14 ENCOUNTER — Other Ambulatory Visit: Payer: Self-pay | Admitting: Internal Medicine

## 2022-12-14 MED ORDER — ATORVASTATIN CALCIUM 80 MG PO TABS: 80.0000 mg | ORAL_TABLET | Freq: Every day | ORAL | 1 refills | Status: DC

## 2022-12-31 ENCOUNTER — Other Ambulatory Visit: Payer: Self-pay | Admitting: Internal Medicine

## 2023-01-13 ENCOUNTER — Ambulatory Visit: Payer: Medicare HMO | Admitting: *Deleted

## 2023-01-13 VITALS — BP 124/70 | HR 58 | Ht 70.0 in | Wt 236.4 lb

## 2023-01-13 DIAGNOSIS — Z0000 Encounter for general adult medical examination without abnormal findings: Secondary | ICD-10-CM

## 2023-01-13 NOTE — Progress Notes (Signed)
Subjective:   Jeffery Cline is a 77 y.o. male who presents for Medicare Annual/Subsequent preventive examination.  Visit Complete: In person  Patient Medicare AWV questionnaire was completed by the patient on 01/11/23; I have confirmed that all information answered by patient is correct and no changes since this date.  Cardiac Risk Factors include: advanced age (>29men, >68 women);male gender;diabetes mellitus;dyslipidemia;hypertension     Objective:    Today's Vitals   01/13/23 0900  BP: 124/70  Pulse: (!) 58  Weight: 236 lb 6.4 oz (107.2 kg)  Height: 5\' 10"  (1.778 m)   Body mass index is 33.92 kg/m.     01/13/2023    9:04 AM 11/17/2021    8:27 AM 11/11/2020    8:18 AM 12/03/2018   11:01 AM 02/10/2017    8:56 AM 05/19/2016   10:30 AM 02/28/2015    3:38 PM  Advanced Directives  Does Patient Have a Medical Advance Directive? Yes Yes Yes No Yes Yes Yes  Type of Estate agent of [place];Living will Healthcare Power of [place];Living will Healthcare Power of [place];Living will  Healthcare Power of [place];Living will Healthcare Power of [place];Living will Healthcare Power of [place];Living will  Does patient want to make changes to medical advance directive? No - Patient declined No - Patient declined       Copy of Healthcare Power of Attorney in Chart? No - copy requested No - copy requested No - copy requested   No - copy requested   Would patient like information on creating a medical advance directive?    No - Patient declined       Current Medications (verified) Outpatient Encounter Medications as of 01/13/2023  Medication Sig   amLODipine (NORVASC) 5 MG tablet Take 1 tablet (5 mg total) by mouth daily.   aspirin EC 81 MG tablet Take 81 mg by mouth daily. Swallow whole.   atorvastatin (LIPITOR) 80 MG tablet Take 1 tablet (80 mg total) by mouth at bedtime.   cetirizine (ZYRTEC ALLERGY) 10 MG tablet Take 1 tablet (10 mg total) by mouth daily.    COMBIGAN 0.2-0.5 % ophthalmic solution INSTILL 1 DROP IN BOTH EYES TWICE A DAY   fenofibrate (TRICOR) 145 MG tablet Take 1 tablet (145 mg total) by mouth daily.   fluticasone (FLONASE) 50 MCG/ACT nasal spray Place 1 spray into both nostrils daily. Begin by using 2 sprays in each nare daily for 3 to 5 days, then decrease to 1 spray in each nare daily.   ipratropium (ATROVENT) 0.03 % nasal spray Place 2 sprays into both nostrils 2 (two) times daily.   metFORMIN (GLUCOPHAGE) 1000 MG tablet Take 1 tablet (1,000 mg total) by mouth 2 (two) times daily with a meal.   metoprolol tartrate (LOPRESSOR) 50 MG tablet Take 1 tablet (50 mg total) by mouth 2 (two) times daily.   pantoprazole (PROTONIX) 40 MG tablet Take 40 mg by mouth daily before breakfast. PRN   telmisartan (MICARDIS) 80 MG tablet Take 1 tablet (80 mg total) by mouth daily.   No facility-administered encounter medications on file as of 01/13/2023.    Allergies (verified) Patient has no known allergies.   History: Past Medical History:  Diagnosis Date   Allergic rhinitis    Allergy    Basal cell carcinoma 2016   Right medial posterior shoulder, shave and ED&C   Cataract    forming    Decreased carotid pulse    decreased L carotid pulse (?) : Nl carotidu/s  02-2006     ED (erectile dysfunction)    GERD (gastroesophageal reflux disease)    Glaucoma    Hyperlipidemia    Hypertension    SCC (squamous cell carcinoma)    Sleep apnea    on CPAP   Past Surgical History:  Procedure Laterality Date   CYSTECTOMY     back, recurrent, last 01-2016   INGUINAL HERNIA REPAIR     right by dr. Magnus Ivan   MOHS SURGERY Left 11/2018   face, betwen nose-ear    PILONIDAL CYST EXCISION     POLYPECTOMY     TONSILLECTOMY     Family History  Problem Relation Age of Onset   Dementia Mother    Alcohol abuse Mother    Coronary artery disease Father 12   Coronary artery disease Maternal Grandfather    Leukemia Paternal Grandfather    Colon  cancer Neg Hx    Prostate cancer Neg Hx    Colon polyps Neg Hx    Rectal cancer Neg Hx    Stomach cancer Neg Hx    Social History   Socioeconomic History   Marital status: Married    Spouse name: Not on file   Number of children: 2   Years of education: Not on file   Highest education level: Bachelor's degree (e.g., BA, AB, BS)  Occupational History   Occupation: retired  Tobacco Use   Smoking status: Never   Smokeless tobacco: Never   Tobacco comments:    Socially in college.  Vaping Use   Vaping status: Never Used  Substance and Sexual Activity   Alcohol use: Not Currently    Comment:     Drug use: No   Sexual activity: Not Currently  Other Topics Concern   Not on file  Social History Narrative    Married. 2 sons ([place] and [place]) , 5 grandkids     Social Determinants of Health   Financial Resource Strain: Low Risk  (01/11/2023)   Overall Financial Resource Strain (CARDIA)    Difficulty of Paying Living Expenses: Not hard at all  Food Insecurity: No Food Insecurity (01/11/2023)   Hunger Vital Sign    Worried About Running Out of Food in the Last Year: Never true    Ran Out of Food in the Last Year: Never true  Transportation Needs: No Transportation Needs (01/11/2023)   PRAPARE - Administrator, Civil Service (Medical): No    Lack of Transportation (Non-Medical): No  Physical Activity: Sufficiently Active (01/11/2023)   Exercise Vital Sign    Days of Exercise per Week: 7 days    Minutes of Exercise per Session: 40 min  Stress: No Stress Concern Present (01/11/2023)   [employer] of Occupational Health - Occupational Stress Questionnaire    Feeling of Stress : Not at all  Social Connections: Socially Integrated (01/11/2023)   Social Connection and Isolation Panel [NHANES]    Frequency of Communication with Friends and Family: More than three times a week    Frequency of Social Gatherings with Friends and Family: Three times a week    Attends Religious  Services: More than 4 times per year    Active Member of Clubs or Organizations: Yes    Attends Banker Meetings: More than 4 times per year    Marital Status: Married    Tobacco Counseling Counseling given: Not Answered Tobacco comments: Socially in college.   Clinical Intake:  Pre-visit preparation completed: Yes  Pain : No/denies  pain  BMI - recorded: 33.92 Nutritional Status: BMI > 30  Obese Nutritional Risks: None Diabetes: Yes CBG done?: No Did pt. bring in CBG monitor from home?: No  How often do you need to have someone help you when you read instructions, pamphlets, or other written materials from your doctor or pharmacy?: 1 - Never  Interpreter Needed?: No  Information entered by :: Donne Anon, CMA   Activities of Daily Living    01/11/2023    9:19 AM  In your present state of health, do you have any difficulty performing the following activities:  Hearing? 0  Vision? 0  Difficulty concentrating or making decisions? 0  Walking or climbing stairs? 0  Dressing or bathing? 0  Doing errands, shopping? 0  Preparing Food and eating ? N  Using the Toilet? N  In the past six months, have you accidently leaked urine? Y  Do you have problems with loss of bowel control? N  Managing your Medications? N  Managing your Finances? N  Housekeeping or managing your Housekeeping? N    Patient Care Team: Wanda Plump, MD as PCP - General Manus Rudd, MD as Consulting Physician (General Surgery) Nelson Chimes, MD as Consulting Physician (Ophthalmology) Arminda Resides, MD as Consulting Physician (Dermatology)  Indicate any recent Medical Services you may have received from other than [facility] providers in the past year (date may be approximate).     Assessment:   This is a routine wellness examination for Taio.  Hearing/Vision screen No results found.   Goals Addressed   None    Depression Screen    01/13/2023    9:07 AM 12/02/2022    9:56 AM  06/02/2022    2:35 PM 12/16/2021    1:00 PM 11/17/2021    8:27 AM 05/20/2021    9:01 AM 11/11/2020    8:22 AM  PHQ 2/9 Scores  PHQ - 2 Score 0 0 0 0 0 0 0    Fall Risk    01/11/2023    9:19 AM 12/02/2022    9:56 AM 06/02/2022    2:35 PM 12/16/2021    1:00 PM 11/17/2021    8:27 AM  Fall Risk   Falls in the past year? 0 0 0 0 0  Number falls in past yr: 0 0 0 0 0  Injury with Fall? 0 0 0 0 0  Risk for fall due to : No Fall Risks    No Fall Risks  Follow up Falls evaluation completed Falls evaluation completed Falls evaluation completed Falls evaluation completed Falls evaluation completed    MEDICARE RISK AT HOME: Medicare Risk at Home Any stairs in or around the home?: Yes If so, are there any without handrails?: No Home free of loose throw rugs in walkways, pet beds, electrical cords, etc?: Yes Adequate lighting in your home to reduce risk of falls?: Yes Life alert?: No Use of a cane, walker or w/c?: No Grab bars in the bathroom?: Yes Shower chair or bench in shower?: Yes Elevated toilet seat or a handicapped toilet?: No  TIMED UP AND GO:  Was the test performed?  Yes  Length of time to ambulate 10 feet: 5 sec Gait steady and fast without use of assistive device    Cognitive Function:        01/13/2023    9:09 AM 11/17/2021    8:32 AM  6CIT Screen  What Year? 0 points 0 points  What month? 0 points 0 points  What time? 0 points 0 points  Count back from 20 0 points 0 points  Months in reverse 0 points 0 points  Repeat phrase 0 points 0 points  Total Score 0 points 0 points    Immunizations Immunization History  Administered Date(s) Administered   Fluad Quad(high Dose 65+) 11/17/2021   Fluad Trivalent(High Dose 65+) 12/02/2022   Influenza Split 11/02/2013   Influenza Whole 11/23/2007, 10/09/2009, 11/09/2012   Influenza, High Dose Seasonal PF 11/18/2016, 09/26/2018, 11/22/2019, 11/14/2020   Influenza,inj,Quad PF,6+ Mos 12/06/2014   Influenza-Unspecified  11/15/2015, 11/26/2017   Moderna Covid-19 Fall Seasonal Vaccine 64yrs & older 10/09/2022   Moderna Covid-19 Vaccine Bivalent Booster 6yrs & up 07/18/2021, 11/19/2021   Moderna Sars-Covid-2 Vaccination 03/06/2019, 04/03/2019, 12/19/2019, 05/20/2020   Pfizer Covid-19 Vaccine Bivalent Booster 32yrs & up 10/30/2020   Pneumococcal Conjugate-13 04/02/2014   Pneumococcal Polysaccharide-23 01/20/2011, 06/07/2020   Respiratory Syncytial Virus Vaccine,Recomb Aduvanted(Arexvy) 04/07/2022   Td 06/07/1998, 09/18/2008   Tdap 02/15/2019   Zoster Recombinant(Shingrix) 02/15/2019, 10/15/2021   Zoster, Live 02/21/2008    TDAP status: Up to date  Flu Vaccine status: Up to date  Pneumococcal vaccine status: Up to date  Covid-19 vaccine status: Information provided on how to obtain vaccines.   Qualifies for Shingles Vaccine? Yes   Zostavax completed Yes   Shingrix Completed?: Yes  Screening Tests Health Maintenance  Topic Date Due   Colonoscopy  02/24/2022   Medicare Annual Wellness (AWV)  11/18/2022   OPHTHALMOLOGY EXAM  12/26/2022   COVID-19 Vaccine (9 - 2023-24 season) 06/01/2023 (Originally 12/04/2022)   FOOT EXAM  06/02/2023   HEMOGLOBIN A1C  06/02/2023   Diabetic kidney evaluation - Urine ACR  06/04/2023   Diabetic kidney evaluation - eGFR measurement  12/02/2023   DTaP/Tdap/Td (4 - Td or Tdap) 02/14/2029   Pneumonia Vaccine 12+ Years old  Completed   INFLUENZA VACCINE  Completed   Hepatitis C Screening  Completed   Zoster Vaccines- Shingrix  Completed   HPV VACCINES  Aged Out    Health Maintenance  Health Maintenance Due  Topic Date Due   Colonoscopy  02/24/2022   Medicare Annual Wellness (AWV)  11/18/2022   OPHTHALMOLOGY EXAM  12/26/2022    Colorectal cancer screening: No longer required.   Lung Cancer Screening: (Low Dose CT Chest recommended if Age 76-80 years, 20 pack-year currently smoking OR have quit w/in 15years.) does not qualify.   Additional  Screening:  Hepatitis C Screening: does qualify; Completed 05/20/16  Vision Screening: Recommended annual ophthalmology exams for early detection of glaucoma and other disorders of the eye. Is the patient up to date with their annual eye exam?  Yes  Who is the provider or what is the name of the office in which the patient attends annual eye exams? [facility]. If pt is not established with a provider, would they like to be referred to a provider to establish care? No .   Dental Screening: Recommended annual dental exams for proper oral hygiene  Diabetic Foot Exam: Diabetic Foot Exam: Completed 06/02/22  Community Resource Referral / Chronic Care Management: CRR required this visit?  No   CCM required this visit?  No     Plan:     I have personally reviewed and noted the following in the patient's chart:   Medical and social history Use of alcohol, tobacco or illicit drugs  Current medications and supplements including opioid prescriptions. Patient is not currently taking opioid prescriptions. Functional ability and status Nutritional status Physical  activity Advanced directives List of other physicians Hospitalizations, surgeries, and ER visits in previous 12 months Vitals Screenings to include cognitive, depression, and falls Referrals and appointments  In addition, I have reviewed and discussed with patient certain preventive protocols, quality metrics, and best practice recommendations. A written personalized care plan for preventive services as well as general preventive health recommendations were provided to patient.     Donne Anon, CMA   01/13/2023   After Visit Summary: (In Person-Declined) Patient declined AVS at this time.  Nurse Notes: None

## 2023-01-13 NOTE — Patient Instructions (Signed)
Jeffery Cline , Thank you for taking time to come for your Medicare Wellness Visit. I appreciate your ongoing commitment to your health goals. Please review the following plan we discussed and let me know if I can assist you in the future.     This is a list of the screening recommended for you and due dates:  Health Maintenance  Topic Date Due   Colon Cancer Screening  02/24/2022   Eye exam for diabetics  12/26/2022   COVID-19 Vaccine (9 - 2023-24 season) 06/01/2023*   Complete foot exam   06/02/2023   Hemoglobin A1C  06/02/2023   Yearly kidney health urinalysis for diabetes  06/04/2023   Yearly kidney function blood test for diabetes  12/02/2023   Medicare Annual Wellness Visit  01/13/2024   DTaP/Tdap/Td vaccine (4 - Td or Tdap) 02/14/2029   Pneumonia Vaccine  Completed   Flu Shot  Completed   Hepatitis C Screening  Completed   Zoster (Shingles) Vaccine  Completed   HPV Vaccine  Aged Out  *Topic was postponed. The date shown is not the original due date.     Next appointment: Follow up in one year for your annual wellness visit.   Preventive Care 77 Years and Older, Male Preventive care refers to lifestyle choices and visits with your health care provider that can promote health and wellness. What does preventive care include? A yearly physical exam. This is also called an annual well check. Dental exams once or twice a year. Routine eye exams. Ask your health care provider how often you should have your eyes checked. Personal lifestyle choices, including: Daily care of your teeth and gums. Regular physical activity. Eating a healthy diet. Avoiding tobacco and drug use. Limiting alcohol use. Practicing safe sex. Taking low doses of aspirin every day. Taking vitamin and mineral supplements as recommended by your health care provider. What happens during an annual well check? The services and screenings done by your health care provider during your annual well check will  depend on your age, overall health, lifestyle risk factors, and family history of disease. Counseling  Your health care provider may ask you questions about your: Alcohol use. Tobacco use. Drug use. Emotional well-being. Home and relationship well-being. Sexual activity. Eating habits. History of falls. Memory and ability to understand (cognition). Work and work Astronomer. Screening  You may have the following tests or measurements: Height, weight, and BMI. Blood pressure. Lipid and cholesterol levels. These may be checked every 5 years, or more frequently if you are over 77 years old. Skin check. Lung cancer screening. You may have this screening every year starting at age 77 if you have a 30-pack-year history of smoking and currently smoke or have quit within the past 15 years. Fecal occult blood test (FOBT) of the stool. You may have this test every year starting at age 77. Flexible sigmoidoscopy or colonoscopy. You may have a sigmoidoscopy every 5 years or a colonoscopy every 10 years starting at age 77. Prostate cancer screening. Recommendations will vary depending on your family history and other risks. Hepatitis C blood test. Hepatitis B blood test. Sexually transmitted disease (STD) testing. Diabetes screening. This is done by checking your blood sugar (glucose) after you have not eaten for a while (fasting). You may have this done every 1-3 years. Abdominal aortic aneurysm (AAA) screening. You may need this if you are a current or former smoker. Osteoporosis. You may be screened starting at age 77 if you are at high  risk. Talk with your health care provider about your test results, treatment options, and if necessary, the need for more tests. Vaccines  Your health care provider may recommend certain vaccines, such as: Influenza vaccine. This is recommended every year. Tetanus, diphtheria, and acellular pertussis (Tdap, Td) vaccine. You may need a Td booster every 10  years. Zoster vaccine. You may need this after age 40. Pneumococcal 13-valent conjugate (PCV13) vaccine. One dose is recommended after age 52. Pneumococcal polysaccharide (PPSV23) vaccine. One dose is recommended after age 3. Talk to your health care provider about which screenings and vaccines you need and how often you need them. This information is not intended to replace advice given to you by your health care provider. Make sure you discuss any questions you have with your health care provider. Document Released: 02/22/2015 Document Revised: 10/16/2015 Document Reviewed: 11/27/2014 Elsevier Interactive Patient Education  2017 [employer].  Fall Prevention in the Home Falls can cause injuries. They can happen to people of all ages. There are many things you can do to make your home safe and to help prevent falls. What can I do on the outside of my home? Regularly fix the edges of walkways and driveways and fix any cracks. Remove anything that might make you trip as you walk through a door, such as a raised step or threshold. Trim any bushes or trees on the path to your home. Use bright outdoor lighting. Clear any walking paths of anything that might make someone trip, such as rocks or tools. Regularly check to see if handrails are loose or broken. Make sure that both sides of any steps have handrails. Any raised decks and porches should have guardrails on the edges. Have any leaves, snow, or ice cleared regularly. Use sand or salt on walking paths during winter. Clean up any spills in your garage right away. This includes oil or grease spills. What can I do in the bathroom? Use night lights. Install grab bars by the toilet and in the tub and shower. Do not use towel bars as grab bars. Use non-skid mats or decals in the tub or shower. If you need to sit down in the shower, use a plastic, non-slip stool. Keep the floor dry. Clean up any water that spills on the floor as soon as it  happens. Remove soap buildup in the tub or shower regularly. Attach bath mats securely with double-sided non-slip rug tape. Do not have throw rugs and other things on the floor that can make you trip. What can I do in the bedroom? Use night lights. Make sure that you have a light by your bed that is easy to reach. Do not use any sheets or blankets that are too big for your bed. They should not hang down onto the floor. Have a firm chair that has side arms. You can use this for support while you get dressed. Do not have throw rugs and other things on the floor that can make you trip. What can I do in the kitchen? Clean up any spills right away. Avoid walking on wet floors. Keep items that you use a lot in easy-to-reach places. If you need to reach something above you, use a strong step stool that has a grab bar. Keep electrical cords out of the way. Do not use floor polish or wax that makes floors slippery. If you must use wax, use non-skid floor wax. Do not have throw rugs and other things on the floor that can  make you trip. What can I do with my stairs? Do not leave any items on the stairs. Make sure that there are handrails on both sides of the stairs and use them. Fix handrails that are broken or loose. Make sure that handrails are as long as the stairways. Check any carpeting to make sure that it is firmly attached to the stairs. Fix any carpet that is loose or worn. Avoid having throw rugs at the top or bottom of the stairs. If you do have throw rugs, attach them to the floor with carpet tape. Make sure that you have a light switch at the top of the stairs and the bottom of the stairs. If you do not have them, ask someone to add them for you. What else can I do to help prevent falls? Wear shoes that: Do not have high heels. Have rubber bottoms. Are comfortable and fit you well. Are closed at the toe. Do not wear sandals. If you use a stepladder: Make sure that it is fully opened.  Do not climb a closed stepladder. Make sure that both sides of the stepladder are locked into place. Ask someone to hold it for you, if possible. Clearly mark and make sure that you can see: Any grab bars or handrails. First and last steps. Where the edge of each step is. Use tools that help you move around (mobility aids) if they are needed. These include: Canes. Walkers. Scooters. Crutches. Turn on the lights when you go into a dark area. Replace any light bulbs as soon as they burn out. Set up your furniture so you have a clear path. Avoid moving your furniture around. If any of your floors are uneven, fix them. If there are any pets around you, be aware of where they are. Review your medicines with your doctor. Some medicines can make you feel dizzy. This can increase your chance of falling. Ask your doctor what other things that you can do to help prevent falls. This information is not intended to replace advice given to you by your health care provider. Make sure you discuss any questions you have with your health care provider. Document Released: 11/22/2008 Document Revised: 07/04/2015 Document Reviewed: 03/02/2014 Elsevier Interactive Patient Education  2017 [employer].

## 2023-01-15 LAB — HM DIABETES EYE EXAM

## 2023-03-31 ENCOUNTER — Other Ambulatory Visit: Payer: Self-pay | Admitting: Internal Medicine

## 2023-06-02 ENCOUNTER — Ambulatory Visit (INDEPENDENT_AMBULATORY_CARE_PROVIDER_SITE_OTHER): Payer: Medicare HMO | Admitting: Internal Medicine

## 2023-06-02 VITALS — BP 136/80 | HR 57 | Temp 98.1°F | Resp 18 | Ht 70.0 in | Wt 233.4 lb

## 2023-06-02 DIAGNOSIS — Z23 Encounter for immunization: Secondary | ICD-10-CM | POA: Diagnosis not present

## 2023-06-02 DIAGNOSIS — Z7984 Long term (current) use of oral hypoglycemic drugs: Secondary | ICD-10-CM

## 2023-06-02 DIAGNOSIS — I10 Essential (primary) hypertension: Secondary | ICD-10-CM

## 2023-06-02 DIAGNOSIS — Z0001 Encounter for general adult medical examination with abnormal findings: Secondary | ICD-10-CM

## 2023-06-02 DIAGNOSIS — Z1211 Encounter for screening for malignant neoplasm of colon: Secondary | ICD-10-CM

## 2023-06-02 DIAGNOSIS — E785 Hyperlipidemia, unspecified: Secondary | ICD-10-CM

## 2023-06-02 DIAGNOSIS — E1159 Type 2 diabetes mellitus with other circulatory complications: Secondary | ICD-10-CM

## 2023-06-02 DIAGNOSIS — Z0000 Encounter for general adult medical examination without abnormal findings: Secondary | ICD-10-CM | POA: Diagnosis not present

## 2023-06-02 LAB — LIPID PANEL
Cholesterol: 129 mg/dL (ref 0–200)
HDL: 23.9 mg/dL — ABNORMAL LOW (ref >39.00)
LDL Cholesterol: 65 mg/dL (ref 0–99)
NonHDL: 105.4
Total CHOL/HDL Ratio: 5
Triglycerides: 201 mg/dL — ABNORMAL HIGH (ref 0.0–149.0)
VLDL: 40.2 mg/dL — ABNORMAL HIGH (ref 0.0–40.0)

## 2023-06-02 LAB — CBC WITH DIFFERENTIAL/PLATELET
Basophils Absolute: 0.1 10*3/uL (ref 0.0–0.1)
Basophils Relative: 0.6 % (ref 0.0–3.0)
Eosinophils Absolute: 0.5 10*3/uL (ref 0.0–0.7)
Eosinophils Relative: 5.7 % — ABNORMAL HIGH (ref 0.0–5.0)
HCT: 42 % (ref 39.0–52.0)
Hemoglobin: 14.2 g/dL (ref 13.0–17.0)
Lymphocytes Relative: 30.1 % (ref 12.0–46.0)
Lymphs Abs: 2.7 10*3/uL (ref 0.7–4.0)
MCHC: 33.7 g/dL (ref 30.0–36.0)
MCV: 92.3 fl (ref 78.0–100.0)
Monocytes Absolute: 1.1 10*3/uL — ABNORMAL HIGH (ref 0.1–1.0)
Monocytes Relative: 11.9 % (ref 3.0–12.0)
Neutro Abs: 4.6 10*3/uL (ref 1.4–7.7)
Neutrophils Relative %: 51.7 % (ref 43.0–77.0)
Platelets: 195 10*3/uL (ref 150.0–400.0)
RBC: 4.56 Mil/uL (ref 4.22–5.81)
RDW: 14 % (ref 11.5–15.5)
WBC: 9 10*3/uL (ref 4.0–10.5)

## 2023-06-02 LAB — COMPREHENSIVE METABOLIC PANEL WITH GFR
ALT: 31 U/L (ref 0–53)
AST: 25 U/L (ref 0–37)
Albumin: 4.4 g/dL (ref 3.5–5.2)
Alkaline Phosphatase: 33 U/L — ABNORMAL LOW (ref 39–117)
BUN: 12 mg/dL (ref 6–23)
CO2: 29 meq/L (ref 19–32)
Calcium: 9.3 mg/dL (ref 8.4–10.5)
Chloride: 104 meq/L (ref 96–112)
Creatinine, Ser: 0.92 mg/dL (ref 0.40–1.50)
GFR: 79.96 mL/min (ref >60.00)
Glucose, Bld: 124 mg/dL — ABNORMAL HIGH (ref 70–99)
Potassium: 3.9 meq/L (ref 3.5–5.1)
Sodium: 139 meq/L (ref 135–145)
Total Bilirubin: 0.8 mg/dL (ref 0.2–1.2)
Total Protein: 6.7 g/dL (ref 6.0–8.3)

## 2023-06-02 LAB — HEMOGLOBIN A1C: Hgb A1c MFr Bld: 6.8 % — ABNORMAL HIGH (ref 4.6–6.5)

## 2023-06-02 LAB — MICROALBUMIN / CREATININE URINE RATIO
Creatinine,U: 119.2 mg/dL
Microalb Creat Ratio: 22.2 mg/g (ref 0.0–30.0)
Microalb, Ur: 2.6 mg/dL — ABNORMAL HIGH (ref 0.0–1.9)

## 2023-06-02 LAB — TSH: TSH: 1.37 u[IU]/mL (ref 0.35–5.50)

## 2023-06-02 NOTE — Progress Notes (Unsigned)
 Subjective:    Patient ID: Jeffery Cline, male    DOB: 1945/07/03, 78 y.o.   MRN: [id number]  DOS:  06/02/2023 Type of visit - description: CPX  Here for CPX. Chronic medical problems addressed. In general feels well other than occasional feeling fatigued. Denies chest pain, difficulty breathing. Has some lower extremity edema, at baseline  Review of Systems  Other than above, a 14 point review of systems is negative     Past Medical History:  Diagnosis Date   Allergic rhinitis    Allergy     Basal cell carcinoma 2016   Right medial posterior shoulder, shave and ED&C   Cataract    forming    Decreased carotid pulse    decreased L carotid pulse (?) : Nl carotidu/s 02-2006     ED (erectile dysfunction)    GERD (gastroesophageal reflux disease)    Glaucoma    Hyperlipidemia    Hypertension    SCC (squamous cell carcinoma)    Sleep apnea    on CPAP    Past Surgical History:  Procedure Laterality Date   CYSTECTOMY     back, recurrent, last 01-2016   INGUINAL HERNIA REPAIR     right by dr. Lucienne Ryder   MOHS SURGERY Left 11/2018   face, betwen nose-ear    PILONIDAL CYST EXCISION     POLYPECTOMY     TONSILLECTOMY     Social History   Socioeconomic History   Marital status: Married    Spouse name: Not on file   Number of children: 2   Years of education: Not on file   Highest education level: Bachelor's degree (e.g., BA, AB, BS)  Occupational History   Occupation: retired  Tobacco Use   Smoking status: Never   Smokeless tobacco: Never   Tobacco comments:    Socially in college.  Vaping Use   Vaping status: Never Used  Substance and Sexual Activity   Alcohol use: Not Currently    Comment:     Drug use: No   Sexual activity: Not Currently  Other Topics Concern   Not on file  Social History Narrative    Married. 2 sons ([place]  and [place]) , 5 grandkids     Social Drivers of Corporate investment banker Strain: Low Risk  (06/02/2023)   Overall Financial  Resource Strain (CARDIA)    Difficulty of Paying Living Expenses: Not hard at all  Food Insecurity: No Food Insecurity (06/02/2023)   Hunger Vital Sign    Worried About Running Out of Food in the Last Year: Never true    Ran Out of Food in the Last Year: Never true  Transportation Needs: No Transportation Needs (06/02/2023)   PRAPARE - Administrator, Civil Service (Medical): No    Lack of Transportation (Non-Medical): No  Physical Activity: Sufficiently Active (06/02/2023)   Exercise Vital Sign    Days of Exercise per Week: 6 days    Minutes of Exercise per Session: 40 min  Stress: No Stress Concern Present (06/02/2023)   [employer] of Occupational Health - Occupational Stress Questionnaire    Feeling of Stress : Only a little  Social Connections: Socially Integrated (06/02/2023)   Social Connection and Isolation Panel [NHANES]    Frequency of Communication with Friends and Family: Three times a week    Frequency of Social Gatherings with Friends and Family: Twice a week    Attends Religious Services: More than 4 times per year  Active Member of Clubs or Organizations: Yes    Attends Banker Meetings: 1 to 4 times per year    Marital Status: Married  Catering manager Violence: Not At Risk (01/13/2023)   Humiliation, Afraid, Rape, and Kick questionnaire    Fear of Current or Ex-Partner: No    Emotionally Abused: No    Physically Abused: No    Sexually Abused: No     Current Outpatient Medications  Medication Instructions   amLODipine  (NORVASC ) 5 mg, Oral, Daily   aspirin EC 81 mg, Daily   atorvastatin  (LIPITOR) 80 mg, Oral, Daily at bedtime   cetirizine  (ZYRTEC  ALLERGY ) 10 mg, Oral, Daily   COMBIGAN 0.2-0.5 % ophthalmic solution INSTILL 1 DROP IN BOTH EYES TWICE A DAY   fenofibrate  (TRICOR ) 145 mg, Oral, Daily   fluticasone  (FLONASE ) 50 MCG/ACT nasal spray 1 spray, Each Nare, Daily, Begin by using 2 sprays in each nare daily for 3 to 5 days, then  decrease to 1 spray in each nare daily.   ipratropium (ATROVENT ) 0.03 % nasal spray 2 sprays, Each Nare, 2 times daily   metFORMIN  (GLUCOPHAGE ) 1,000 mg, Oral, 2 times daily with meals   metoprolol  tartrate (LOPRESSOR ) 50 mg, Oral, 2 times daily   pantoprazole  (PROTONIX ) 40 mg, Daily before breakfast   telmisartan (MICARDIS) 80 mg, Oral, Daily       Objective:   Physical Exam BP 136/80   Pulse (!) 57   Temp 98.1 F (36.7 C) (Oral)   Resp 18   Ht 5\' 10"  (1.778 m)   Wt 233 lb 6 oz (105.9 kg)   SpO2 97%   BMI 33.49 kg/m  General: Well developed, NAD, BMI noted Neck: No  thyromegaly  HEENT:  Normocephalic . Face symmetric, atraumatic Lungs:  CTA B Normal respiratory effort, no intercostal retractions, no accessory muscle use. Heart: RRR,  no murmur.  Abdomen:  Not distended, soft, non-tender. No rebound or rigidity.   DM foot exam: Trace edema distally, good pedal pulses, pinprick examination normal Skin: Exposed areas without rash. Not pale. Not jaundice Neurologic:  alert & oriented X3.  Speech normal, gait appropriate for age and unassisted Strength symmetric and appropriate for age.  Psych: Cognition and judgment appear intact.  Cooperative with normal attention span and concentration.  Behavior appropriate. No anxious or depressed appearing.     Assessment   Assessment DM  A1c 6.5 (05-2016)  Obesity HTN Hyperlipidemia Coronary calcifications: Per CT, 01/2020 (Rx CV RF) Low testosterone : Saw endocrinology 06/2014 , mild hypogonadotropic hypogonadism. no rx recommended  Decreased libido , ED: not interested in Rx OSA -- on CPAP , rx Dr Villa Greaser GERD ( dx based on response to cough to PPIs) Decreased L carotid pulse? Normal carotid ultrasound 2008 [facility] 2016- sees derm q year Dr Rochelle Chu as off 05-2023 Glaucoma    BPH Chest 01/2020: Bronchiectasis.  Scattered solid pulmonary nodules stable.  Three-vessel CAD.  Hepatic steatosis.  Ectatic ascending thoracic aorta  unchanged  PLAN: Here for CPX - Td 2021 - pnm 23: 2012, 05/2020; Prevnar 2016.  PNM 20: Today - Zostavax 02-2008;shingrex  X 2 per pt  - s/p RSV vax.  Had a COVID-vaccine fall 2024 - Recommend a flu shot every fall and a COVID-vaccine if not done recently --CCS Colonoscopy:  01/09/2002, normal Colonoscopy 04/2011, 2 polyps,  Cscope 02-2017, 5 years.  Discussed issue with patient, he is over 48, colonoscopy is not mandatory, he will think about it and possibly reach out to  GI. -- No further prostate cancer screening, previous PSAs normal.  No symptoms --diet-exercise: walks 2 miles daily. --Labs:  CMP FLP CBC A1c TSH - ACP: See AVS Other issues addressed today: DM,On metformin , no ambulatory CBGs.  Check A1c, micro.  Feet exam negative HTN: No ambulatory BPs, BP today is very good, continue amlodipine , metoprolol , Micardis, checking a CMP, CBC. Hyperlipidemia: On Lipitor, fenofibrate , check FLP. Coronary calcifications: asymptomatic, continue aspirin. OSA: Reports good CPAP compliance. RTC 6 months

## 2023-06-02 NOTE — Patient Instructions (Addendum)
 INSTRUCTIONS  FOR TODAY  Consider flu shot every fall.   Check the  blood pressure regularly Blood pressure goal:  between 110/65 and  135/85. If it is consistently higher or lower, let me know     GO TO THE LAB : Get the blood work     Next office visit for a checkup in 6 months Please make an appointment before you leave today          "Health Care Power of attorney" (Also know as a  "Living will" or  Advance care planning documents)  If you already have a living will or healthcare power of attorney, is recommended you bring the copy to be scanned in your chart.   The document will be available to all the doctors you see in the system.  If you are over 66 y/o and don't have the document, please read:  Advance care planning is a process that supports adults in  understanding and sharing their preferences regarding future medical care.  The patient's preferences are recorded in documents called Advance Directives and the can be modified at any time while the patient is in full mental capacity.     More information at: [email or web address]

## 2023-06-03 ENCOUNTER — Encounter: Payer: Self-pay | Admitting: Internal Medicine

## 2023-06-03 NOTE — Assessment & Plan Note (Signed)
 Here for CPX - Td 2021 - pnm 23: 2012, 05/2020; Prevnar 2016.  PNM 20: Today - Zostavax 02-2008;shingrex  X 2 per pt  - s/p RSV vax.  Had a COVID-vaccine fall 2024 - Recommend a flu shot every fall and a COVID-vaccine if not done recently --CCS Colonoscopy:  01/09/2002, normal Colonoscopy 04/2011, 2 polyps,  Cscope 02-2017, 5 years.  Discussed issue with patient, he is over 54, colonoscopy is not mandatory, he will think about it and possibly reach out to GI. -- No further prostate cancer screening, previous PSAs normal.  No symptoms --diet-exercise: walks 2 miles daily. --Labs:  CMP FLP CBC A1c TSH - ACP: See AVS

## 2023-06-03 NOTE — Assessment & Plan Note (Signed)
 Here for CPX  Other issues addressed today: DM,On metformin , no ambulatory CBGs.  Check A1c, micro.  Feet exam negative HTN: No ambulatory BPs, BP today is very good, continue amlodipine , metoprolol , Micardis, checking a CMP, CBC. Hyperlipidemia: On Lipitor, fenofibrate , check FLP. Coronary calcifications: asymptomatic, continue aspirin. OSA: Reports good CPAP compliance. RTC 6 months

## 2023-06-05 ENCOUNTER — Other Ambulatory Visit: Payer: Self-pay | Admitting: Internal Medicine

## 2023-06-07 ENCOUNTER — Encounter: Payer: Self-pay | Admitting: Internal Medicine

## 2023-06-25 ENCOUNTER — Encounter: Payer: Self-pay | Admitting: Internal Medicine

## 2023-06-27 ENCOUNTER — Other Ambulatory Visit: Payer: Self-pay | Admitting: Internal Medicine

## 2023-07-12 ENCOUNTER — Other Ambulatory Visit: Payer: Self-pay | Admitting: Internal Medicine

## 2023-07-19 ENCOUNTER — Other Ambulatory Visit: Payer: Self-pay

## 2023-08-03 ENCOUNTER — Ambulatory Visit (AMBULATORY_SURGERY_CENTER)

## 2023-08-03 ENCOUNTER — Encounter: Payer: Self-pay | Admitting: Internal Medicine

## 2023-08-03 VITALS — Ht 70.0 in | Wt 233.0 lb

## 2023-08-03 DIAGNOSIS — Z860100 Personal history of colon polyps, unspecified: Secondary | ICD-10-CM

## 2023-08-03 MED ORDER — NA SULFATE-K SULFATE-MG SULF 17.5-3.13-1.6 GM/177ML PO SOLN
1.0000 | Freq: Once | ORAL | 0 refills | Status: AC
Start: 2023-08-03 — End: 2023-08-03

## 2023-08-03 NOTE — Progress Notes (Signed)
 No egg or soy allergy known to patient  No issues known to pt with past sedation with any surgeries or procedures Patient denies ever being told they had issues or difficulty with intubation  No FH of Malignant Hyperthermia Pt is not on diet pills Pt is not on  home 02  Pt is not on blood thinners  Pt denies issues with constipation  No A fib or A flutter Have any cardiac testing pending--NO Pt can ambulate- Independently Pt denies use of chewing tobacco Discussed diabetic I weight loss medication holds Discussed NSAID holds Checked BMI Pt instructed to use Singlecare.com or GoodRx for a price reduction on prep  Patient's chart reviewed by Cathlyn Parsons CNRA prior to previsit and patient appropriate for the [facility].  Pre visit completed and red dot placed by patient's name on their procedure day (on provider's schedule).

## 2023-08-16 NOTE — Progress Notes (Unsigned)
 [facility] History and Physical   Primary Care Physician:  Amon Aloysius BRAVO, MD   Reason for Procedure:  History of colon polyps  Plan:    Colonoscopy -use pediatric scope     HPI: Jeffery Cline is a 78 y.o. male presenting for surveillance colonoscopy examination.  He has a history of colon polyps with 2 diminutive adenomas in 2019 and also in 2013.  He also has significant sigmoid diverticulosis and somewhat restricted mobility of the colon which made the last colonoscopy somewhat difficult.  Single tiny AVM in the cecum also.   Past Medical History:  Diagnosis Date   Allergic rhinitis    Allergy     Basal cell carcinoma 2016   Right medial posterior shoulder, shave and ED&C   Cataract    forming    Decreased carotid pulse    decreased L carotid pulse (?) : Nl carotidu/s 02-2006     Diabetes mellitus without complication (HCC)    ED (erectile dysfunction)    GERD (gastroesophageal reflux disease)    Glaucoma    Hyperlipidemia    Hypertension    SCC (squamous cell carcinoma)    Sleep apnea    on CPAP    Past Surgical History:  Procedure Laterality Date   CYSTECTOMY     back, recurrent, last 01-2016   INGUINAL HERNIA REPAIR     right by dr. vernetta   MOHS SURGERY Left 11/2018   face, betwen nose-ear    PILONIDAL CYST EXCISION     POLYPECTOMY     TONSILLECTOMY        Current Outpatient Medications  Medication Sig Dispense Refill   amLODipine  (NORVASC ) 5 MG tablet TAKE 1 TABLET (5 MG TOTAL) BY MOUTH DAILY. 90 tablet 1   aspirin EC 81 MG tablet Take 81 mg by mouth daily. Swallow whole.     atorvastatin  (LIPITOR) 80 MG tablet Take 1 tablet (80 mg total) by mouth at bedtime. 90 tablet 1   azelastine  (ASTELIN ) 0.1 % nasal spray Place 2 sprays into both nostrils 2 (two) times daily. AS needed     cetirizine  (ZYRTEC  ALLERGY ) 10 MG tablet Take 1 tablet (10 mg total) by mouth daily. 30 tablet 0   COMBIGAN 0.2-0.5 % ophthalmic solution INSTILL 1 DROP IN  BOTH EYES TWICE A DAY  3   fenofibrate  (TRICOR ) 145 MG tablet Take 1 tablet (145 mg total) by mouth daily. 90 tablet 1   fluticasone  (FLONASE ) 50 MCG/ACT nasal spray Place 1 spray into both nostrils daily. Begin by using 2 sprays in each nare daily for 3 to 5 days, then decrease to 1 spray in each nare daily. 15.8 mL 2   ipratropium (ATROVENT ) 0.03 % nasal spray Place 2 sprays into both nostrils 2 (two) times daily. 30 mL 0   metFORMIN  (GLUCOPHAGE ) 1000 MG tablet Take 1 tablet (1,000 mg total) by mouth 2 (two) times daily with a meal. 180 tablet 1   metoprolol  tartrate (LOPRESSOR ) 50 MG tablet TAKE 1 TABLET BY MOUTH TWICE A DAY 180 tablet 1   mupirocin ointment (BACTROBAN) 2 % Apply 1 Application topically. As needed     pantoprazole  (PROTONIX ) 40 MG tablet Take 40 mg by mouth daily before breakfast. PRN     telmisartan (MICARDIS) 80 MG tablet TAKE 1 TABLET BY MOUTH EVERY DAY 90 tablet 1   No current facility-administered medications for this visit.    Allergies as of 08/17/2023   (No Known Allergies)    Family  History  Problem Relation Age of Onset   Dementia Mother    Alcohol abuse Mother    Coronary artery disease Father 25   Coronary artery disease Maternal Grandfather    Leukemia Paternal Grandfather    Colon cancer Neg Hx    Prostate cancer Neg Hx    Colon polyps Neg Hx    Rectal cancer Neg Hx    Stomach cancer Neg Hx    Esophageal cancer Neg Hx     Social History   Socioeconomic History   Marital status: Married    Spouse name: Not on file   Number of children: 2   Years of education: Not on file   Highest education level: Bachelor's degree (e.g., BA, AB, BS)  Occupational History   Occupation: retired  Tobacco Use   Smoking status: Never   Smokeless tobacco: Never   Tobacco comments:    Socially in college.  Vaping Use   Vaping status: Never Used  Substance and Sexual Activity   Alcohol use: Not Currently    Comment:     Drug use: No   Sexual activity: Not  Currently  Other Topics Concern   Not on file  Social History Narrative    Married. 2 sons ([place]  and [place]) , 5 grandkids     Social Drivers of Corporate investment banker Strain: Low Risk  (06/02/2023)   Overall Financial Resource Strain (CARDIA)    Difficulty of Paying Living Expenses: Not hard at all  Food Insecurity: No Food Insecurity (06/02/2023)   Hunger Vital Sign    Worried About Running Out of Food in the Last Year: Never true    Ran Out of Food in the Last Year: Never true  Transportation Needs: No Transportation Needs (06/02/2023)   PRAPARE - Administrator, Civil Service (Medical): No    Lack of Transportation (Non-Medical): No  Physical Activity: Sufficiently Active (06/02/2023)   Exercise Vital Sign    Days of Exercise per Week: 6 days    Minutes of Exercise per Session: 40 min  Stress: No Stress Concern Present (06/02/2023)   [employer] of Occupational Health - Occupational Stress Questionnaire    Feeling of Stress : Only a little  Social Connections: Socially Integrated (06/02/2023)   Social Connection and Isolation Panel    Frequency of Communication with Friends and Family: Three times a week    Frequency of Social Gatherings with Friends and Family: Twice a week    Attends Religious Services: More than 4 times per year    Active Member of [employer] or Organizations: Yes    Attends Banker Meetings: 1 to 4 times per year    Marital Status: Married  Catering manager Violence: Not At Risk (01/13/2023)   Humiliation, Afraid, Rape, and Kick questionnaire    Fear of Current or Ex-Partner: No    Emotionally Abused: No    Physically Abused: No    Sexually Abused: No    Review of Systems: Positive for *** All other review of systems negative except as mentioned in the HPI.  Physical Exam: Vital signs There were no vitals taken for this visit.  General:   Alert,  Well-developed, well-nourished, pleasant and cooperative in NAD Lungs:   Clear throughout to auscultation.   Heart:  Regular rate and rhythm; no murmurs, clicks, rubs,  or gallops. Abdomen:  Soft, nontender and nondistended. Normal bowel sounds.   Neuro/Psych:  Alert and cooperative. Normal mood and affect. A  and O x 3   @Keriann Rankin  CHARLENA Commander, MD, [facility] [phone] (pager) 08/16/2023 6:26 PM@

## 2023-08-17 ENCOUNTER — Ambulatory Visit: Admitting: Internal Medicine

## 2023-08-17 ENCOUNTER — Encounter: Payer: Self-pay | Admitting: Internal Medicine

## 2023-08-17 VITALS — BP 111/69 | HR 59 | Temp 97.8°F | Resp 11 | Ht 70.0 in | Wt 233.0 lb

## 2023-08-17 DIAGNOSIS — D123 Benign neoplasm of transverse colon: Secondary | ICD-10-CM

## 2023-08-17 DIAGNOSIS — R011 Cardiac murmur, unspecified: Secondary | ICD-10-CM | POA: Insufficient documentation

## 2023-08-17 DIAGNOSIS — D122 Benign neoplasm of ascending colon: Secondary | ICD-10-CM | POA: Diagnosis not present

## 2023-08-17 DIAGNOSIS — Z1211 Encounter for screening for malignant neoplasm of colon: Secondary | ICD-10-CM | POA: Diagnosis present

## 2023-08-17 DIAGNOSIS — K644 Residual hemorrhoidal skin tags: Secondary | ICD-10-CM

## 2023-08-17 DIAGNOSIS — K648 Other hemorrhoids: Secondary | ICD-10-CM

## 2023-08-17 DIAGNOSIS — Z860100 Personal history of colon polyps, unspecified: Secondary | ICD-10-CM

## 2023-08-17 DIAGNOSIS — K5520 Angiodysplasia of colon without hemorrhage: Secondary | ICD-10-CM | POA: Diagnosis not present

## 2023-08-17 DIAGNOSIS — K5730 Diverticulosis of large intestine without perforation or abscess without bleeding: Secondary | ICD-10-CM | POA: Diagnosis not present

## 2023-08-17 MED ORDER — SODIUM CHLORIDE 0.9 % IV SOLN
500.0000 mL | Freq: Once | INTRAVENOUS | Status: DC
Start: 2023-08-17 — End: 2023-08-17

## 2023-08-17 NOTE — Progress Notes (Signed)
 Pt's states no medical or surgical changes since previsit or office visit.

## 2023-08-17 NOTE — Patient Instructions (Addendum)
 I heard a heart murmur today - I will notify Dr. Amon about this but ask that you follow-up if you do not hear back from him. I expect he will order an echocardiogram.  There were 2 tiny polyps removed, diverticulosis, hemorrhoids and the AVM lesion as seen before.  I will let you know pathology results but I am not recommending another routine repeat colonoscopy.  I appreciate the opportunity to care for you. Lupita CHARLENA Commander, MD, [facility]    Discharge instructions given. Handouts on polyps,Diverticulosis and Hemorrhoids. Resume previous medications.YOU HAD AN ENDOSCOPIC PROCEDURE TODAY AT THE [facility]:   Refer to the procedure report that was given to you for any specific questions about what was found during the examination.  If the procedure report does not answer your questions, please call your gastroenterologist to clarify.  If you requested that your care partner not be given the details of your procedure findings, then the procedure report has been included in a sealed envelope for you to review at your convenience later.  YOU SHOULD EXPECT: Some feelings of bloating in the abdomen. Passage of more gas than usual.  Walking can help get rid of the air that was put into your GI tract during the procedure and reduce the bloating. If you had a lower endoscopy (such as a colonoscopy or flexible sigmoidoscopy) you may notice spotting of blood in your stool or on the toilet paper. If you underwent a bowel prep for your procedure, you may not have a normal bowel movement for a few days.  Please Note:  You might notice some irritation and congestion in your nose or some drainage.  This is from the oxygen used during your procedure.  There is no need for concern and it should clear up in a day or so.  SYMPTOMS TO REPORT IMMEDIATELY:  Following lower endoscopy (colonoscopy or flexible sigmoidoscopy):  Excessive amounts of blood in the stool  Significant tenderness or worsening of  abdominal pains  Swelling of the abdomen that is new, acute  Fever of 100F or higher   For urgent or emergent issues, a gastroenterologist can be reached at any hour by calling [phone]. Do not use MyChart messaging for urgent concerns.    DIET:  We do recommend a small meal at first, but then you may proceed to your regular diet.  Drink plenty of fluids but you should avoid alcoholic beverages for 24 hours.  ACTIVITY:  You should plan to take it easy for the rest of today and you should NOT DRIVE or use heavy machinery until tomorrow (because of the sedation medicines used during the test).    FOLLOW UP: Our staff will call the number listed on your records the next business day following your procedure.  We will call around 7:15- 8:00 am to check on you and address any questions or concerns that you may have regarding the information given to you following your procedure. If we do not reach you, we will leave a message.     If any biopsies were taken you will be contacted by phone or by letter within the next 1-3 weeks.  Please call us  at [phone] if you have not heard about the biopsies in 3 weeks.    SIGNATURES/CONFIDENTIALITY: You and/or your care partner have signed paperwork which will be entered into your electronic medical record.  These signatures attest to the fact that that the information above on your After Visit Summary has been  reviewed and is understood.  Full responsibility of the confidentiality of this discharge information lies with you and/or your care-partner.

## 2023-08-17 NOTE — Progress Notes (Signed)
 Called to room to assist during endoscopic procedure.  Patient ID and intended procedure confirmed with present staff. Received instructions for my participation in the procedure from the performing physician.

## 2023-08-17 NOTE — Progress Notes (Signed)
To PACU, VSS. Report to [email or web address] 

## 2023-08-17 NOTE — Op Note (Signed)
 [facility] Patient Name: Jeffery Cline Procedure Date: 08/17/2023 9:11 AM MRN: [id number] Endoscopist: Lupita FORBES Commander , MD, [id number] Age: 78 Referring MD:  Date of Birth: Mar 01, 1945 Gender: Male Account #: [id number] Procedure:                Colonoscopy Indications:              Surveillance: Personal history of adenomatous                            polyps on last colonoscopy > 5 years ago, Last                            colonoscopy: 2019 Medicines:                Monitored Anesthesia Care Procedure:                Pre-Anesthesia Assessment:                           - Prior to the procedure, a History and Physical                            was performed, and patient medications and                            allergies were reviewed. The patient's tolerance of                            previous anesthesia was also reviewed. The risks                            and benefits of the procedure and the sedation                            options and risks were discussed with the patient.                            All questions were answered, and informed consent                            was obtained. Prior Anticoagulants: The patient has                            taken no anticoagulant or antiplatelet agents. ASA                            Grade Assessment: III - A patient with severe                            systemic disease. After reviewing the risks and                            benefits, the patient was deemed in satisfactory  condition to undergo the procedure.                           After obtaining informed consent, the colonoscope                            was passed under direct vision. Throughout the                            procedure, the patient's blood pressure, pulse, and                            oxygen saturations were monitored continuously. The                            Olympus Scope SN: [id number] was introduced  through                            the anus and advanced to the the cecum, identified                            by appendiceal orifice and ileocecal valve. The                            colonoscopy was performed without difficulty. The                            patient tolerated the procedure well. The quality                            of the bowel preparation was good. The ileocecal                            valve, appendiceal orifice, and rectum were                            photographed. The bowel preparation used was SUPREP                            via split dose instruction. Scope In: 9:21:49 AM Scope Out: 9:39:06 AM Scope Withdrawal Time: 0 hours 13 minutes 8 seconds  Total Procedure Duration: 0 hours 17 minutes 17 seconds  Findings:                 Hemorrhoids were found on perianal exam.                           Two sessile polyps were found in the transverse                            colon and ascending colon. The polyps were                            diminutive in size. These polyps were removed with  a cold snare. Resection and retrieval were                            complete. Verification of patient identification                            for the specimen was done. Estimated blood loss was                            minimal.                           A single small angiodysplastic lesion without                            bleeding was found in the cecum.                           Multiple diverticula were found in the sigmoid                            colon. There was narrowing of the colon in                            association with the diverticular opening.                           External and internal hemorrhoids were found.                           The exam was otherwise without abnormality on                            direct and retroflexion views. Complications:            No immediate complications. Estimated Blood  Loss:     Estimated blood loss was minimal. Impression:               - Hemorrhoids found on perianal exam.                           - Two diminutive polyps in the transverse colon and                            in the ascending colon, removed with a cold snare.                            Resected and retrieved.                           - A single non-bleeding colonic angiodysplastic                            lesion.                           -  Severe diverticulosis in the sigmoid colon. There                            was narrowing of the colon in association with the                            diverticular opening.                           - External and internal hemorrhoids.                           - The examination was otherwise normal on direct                            and retroflexion views.                           - Personal history of colonic polyps. 2 diminutive                            adenomas each 2013 and 2019 Recommendation:           - Patient has a contact number available for                            emergencies. The signs and symptoms of potential                            delayed complications were discussed with the                            patient. Return to normal activities tomorrow.                            Written discharge instructions were provided to the                            patient.                           - Resume previous diet.                           - Continue present medications.                           - Await pathology results.                           - No routine repeat colonoscopy due to age. Should                            he need a repeat exam would use pediatric scope  again given severe diverticulosis.                           - F/U PCP re: systolic heart murmur heard today - I                            will message PCP Lupita FORBES Commander, MD 08/17/2023 9:50:27 AM This report has been  signed electronically.

## 2023-08-18 ENCOUNTER — Telehealth: Payer: Self-pay

## 2023-08-18 ENCOUNTER — Encounter: Payer: Self-pay | Admitting: Internal Medicine

## 2023-08-18 DIAGNOSIS — R011 Cardiac murmur, unspecified: Secondary | ICD-10-CM

## 2023-08-18 NOTE — Telephone Encounter (Signed)
  Follow up Call-     08/17/2023    8:21 AM  Call back number  Post procedure Call Back phone  # [phone]  Permission to leave phone message Yes     Patient questions:  Do you have a fever, pain , or abdominal swelling? No. Pain Score  0 *  Have you tolerated food without any problems? Yes.    Have you been able to return to your normal activities? Yes.    Do you have any questions about your discharge instructions: Diet   No. Medications  No. Follow up visit  No.  Do you have questions or concerns about your Care? No.  Actions: * If pain score is 4 or above: No action needed, pain <4.

## 2023-08-20 LAB — SURGICAL PATHOLOGY

## 2023-08-22 ENCOUNTER — Ambulatory Visit: Payer: Self-pay | Admitting: Internal Medicine

## 2023-08-22 DIAGNOSIS — Z860100 Personal history of colon polyps, unspecified: Secondary | ICD-10-CM

## 2023-09-06 ENCOUNTER — Ambulatory Visit (HOSPITAL_COMMUNITY)
Admission: RE | Admit: 2023-09-06 | Discharge: 2023-09-06 | Disposition: A | Discharge status: Home / Self Care | Source: Ambulatory Visit | Attending: Internal Medicine | Admitting: Internal Medicine

## 2023-09-06 DIAGNOSIS — I517 Cardiomegaly: Secondary | ICD-10-CM | POA: Diagnosis not present

## 2023-09-06 DIAGNOSIS — R011 Cardiac murmur, unspecified: Secondary | ICD-10-CM | POA: Diagnosis present

## 2023-09-06 LAB — ECHOCARDIOGRAM COMPLETE
AR max vel: 2.35 cm2
AV Area VTI: 2.4 cm2
AV Area mean vel: 2.44 cm2
AV Mean grad: 7.3 mmHg
AV Peak grad: 14 mmHg
Ao pk vel: 1.87 m/s
Area-P 1/2: 3.08 cm2
S' Lateral: 3.05 cm

## 2023-11-23 ENCOUNTER — Ambulatory Visit (INDEPENDENT_AMBULATORY_CARE_PROVIDER_SITE_OTHER): Admitting: Adult Health

## 2023-11-23 ENCOUNTER — Encounter: Payer: Self-pay | Admitting: Adult Health

## 2023-11-23 VITALS — BP 136/74 | HR 58 | Temp 97.4°F | Ht 70.0 in | Wt 230.0 lb

## 2023-11-23 DIAGNOSIS — J479 Bronchiectasis, uncomplicated: Secondary | ICD-10-CM | POA: Diagnosis not present

## 2023-11-23 DIAGNOSIS — R011 Cardiac murmur, unspecified: Secondary | ICD-10-CM | POA: Diagnosis not present

## 2023-11-23 DIAGNOSIS — K219 Gastro-esophageal reflux disease without esophagitis: Secondary | ICD-10-CM

## 2023-11-23 DIAGNOSIS — I700 Atherosclerosis of aorta: Secondary | ICD-10-CM

## 2023-11-23 DIAGNOSIS — G4733 Obstructive sleep apnea (adult) (pediatric): Secondary | ICD-10-CM

## 2023-11-23 DIAGNOSIS — J310 Chronic rhinitis: Secondary | ICD-10-CM

## 2023-11-23 DIAGNOSIS — J309 Allergic rhinitis, unspecified: Secondary | ICD-10-CM

## 2023-11-23 DIAGNOSIS — I712 Thoracic aortic aneurysm, without rupture, unspecified: Secondary | ICD-10-CM

## 2023-11-23 NOTE — Progress Notes (Unsigned)
 @Patient  ID: Jeffery Cline, male    DOB: [birth year], 78 y.o.   MRN: [id number]  Chief Complaint  Patient presents with   Follow-up    OSA and bronchiectasis    Referring provider: Amon Aloysius BRAVO, MD  HPI: 78 year old male never smoker followed for obstructive sleep apnea, bronchiectasis, lung nodules (stable on serial CT chest), allergic rhinitis    TEST/EVENTS :  HST 07/2014 showed moderate OSA with AHI 27/hour and lowest desaturation of 79%. Total sleep time was 8 hours.   CT chest January 11, 2019 clustered tree-in-bud nodularity right upper lobe, scattered tiny bilateral pulmonary nodules measuring up to 5 mm.   CT chest done January 22, 2020 showed tree-in-bud opacities in the right upper lobe mildly increased with associated bronchiectasis.  Stable scattered pulmonary nodules measuring up to 5 mm consistent with benign etiology Discussed the use of AI scribe software for clinical note transcription with the patient, who gave verbal consent to proceed.  History of Present Illness Jeffery Cline is a 78 year old male who presents for a follow-up on CPAP therapy.  He has no issues with his CPAP therapy, stating that his pressures are perfect and he is experiencing no sleep apnea. He uses a full face mask and is satisfied with it. Cooler weather improves his sleep quality. He occasionally wakes up at night due to urinary issues but can manage it.  He has a history of acid reflux which has moderated over time. He and his wife walk every morning for a couple of miles, which he finds beneficial. He experiences sinus issues that gradually worsen without him realizing until they become significant. He had a chest x-ray last summer which was clear, and he has not had any significant breathing problems or flare-ups of bronchiectasis in the last two to three years.  He had a colonoscopy earlier this year where a heart murmur was noted, leading to an echocardiogram. He has a  history of atherosclerosis and an enlargement in the ascending aorta, which is being monitored. He experiences occasional drowsiness around 10 AM, which he attributes to possibly being up for several hours by then.  He had blood work done in April which showed good thyroid  function, non-anemic status, and slightly elevated cholesterol and blood sugar levels. He is on multiple medications including Norvasc , Lipitor, Tricor , Glucophage , and metoprolol , which he acknowledges may contribute to fatigue.  His weight fluctuates between 220 and 230 pounds. He maintains an active lifestyle with daily walks and tries to moderate his diet. He stays up to date with vaccinations, including flu and shingles shots.     No Known Allergies  Immunization History  Administered Date(s) Administered   Fluad Quad(high Dose 65+) 11/17/2021   Fluad Trivalent(High Dose 65+) 12/02/2022   INFLUENZA, HIGH DOSE SEASONAL PF 11/18/2016, 09/26/2018, 11/22/2019, 11/14/2020   Influenza Split 11/02/2013   Influenza Whole 11/23/2007, 10/09/2009, 11/09/2012   Influenza,inj,Quad PF,6+ Mos 12/06/2014   Influenza-Unspecified 11/15/2015, 11/26/2017   Moderna Covid-19 Fall Seasonal Vaccine 25yrs & older 10/09/2022   Moderna Covid-19 Vaccine  Bivalent Booster 77yrs & up 07/18/2021, 11/19/2021   Moderna Sars-Covid-2 Vaccination 03/06/2019, 04/03/2019, 12/19/2019, 05/20/2020   PNEUMOCOCCAL CONJUGATE-20 06/02/2023   Pfizer Covid-19 Vaccine Bivalent Booster 75yrs & up 10/30/2020   Pneumococcal Conjugate-13 04/02/2014   Pneumococcal Polysaccharide-23 01/20/2011, 06/07/2020   Respiratory Syncytial Virus Vaccine,Recomb Aduvanted(Arexvy) 04/07/2022   Td 06/07/1998, 09/18/2008   Tdap 02/15/2019   Zoster Recombinant(Shingrix ) 02/15/2019, 10/15/2021   Zoster, Live 02/21/2008  Past Medical History:  Diagnosis Date   Allergic rhinitis    Allergy     Basal cell carcinoma 2016   Right medial posterior shoulder, shave and ED&C    Cataract    forming    Decreased carotid pulse    decreased L carotid pulse (?) : Nl carotidu/s 02-2006     Diabetes mellitus without complication (HCC)    ED (erectile dysfunction)    GERD (gastroesophageal reflux disease)    Glaucoma    Hyperlipidemia    Hypertension    SCC (squamous cell carcinoma)    Sleep apnea    on CPAP    Tobacco History: Social History   Tobacco Use  Smoking Status Never  Smokeless Tobacco Never  Tobacco Comments   Socially in college.   Counseling given: Not Answered Tobacco comments: Socially in college.   Outpatient Medications Prior to Visit  Medication Sig Dispense Refill   amLODipine  (NORVASC ) 5 MG tablet TAKE 1 TABLET (5 MG TOTAL) BY MOUTH DAILY. 90 tablet 1   aspirin EC 81 MG tablet Take 81 mg by mouth daily. Swallow whole.     atorvastatin  (LIPITOR) 80 MG tablet Take 1 tablet (80 mg total) by mouth at bedtime. 90 tablet 1   cetirizine  (ZYRTEC  ALLERGY ) 10 MG tablet Take 1 tablet (10 mg total) by mouth daily. (Patient taking differently: Take 10 mg by mouth as needed.) 30 tablet 0   COMBIGAN 0.2-0.5 % ophthalmic solution INSTILL 1 DROP IN BOTH EYES TWICE A DAY  3   fenofibrate  (TRICOR ) 145 MG tablet Take 1 tablet (145 mg total) by mouth daily. 90 tablet 1   metFORMIN  (GLUCOPHAGE ) 1000 MG tablet Take 1 tablet (1,000 mg total) by mouth 2 (two) times daily with a meal. 180 tablet 1   metoprolol  tartrate (LOPRESSOR ) 50 MG tablet TAKE 1 TABLET BY MOUTH TWICE A DAY 180 tablet 1   pantoprazole  (PROTONIX ) 40 MG tablet Take 40 mg by mouth daily before breakfast. PRN     telmisartan (MICARDIS) 80 MG tablet TAKE 1 TABLET BY MOUTH EVERY DAY 90 tablet 1   azelastine  (ASTELIN ) 0.1 % nasal spray Place 2 sprays into both nostrils 2 (two) times daily. AS needed (Patient not taking: Reported on 11/23/2023)     mupirocin ointment (BACTROBAN) 2 % Apply 1 Application topically. As needed (Patient not taking: Reported on 11/23/2023)     No facility-administered  medications prior to visit.     Review of Systems:   Constitutional:   No  weight loss, night sweats,  Fevers, chills, fatigue, or  lassitude.  HEENT:   No headaches,  Difficulty swallowing,  Tooth/dental problems, or  Sore throat,                No sneezing, itching, ear ache, nasal congestion, post nasal drip,   CV:  No chest pain,  Orthopnea, PND, swelling in lower extremities, anasarca, dizziness, palpitations, syncope.   GI  No heartburn, indigestion, abdominal pain, nausea, vomiting, diarrhea, change in bowel habits, loss of appetite, bloody stools.   Resp: No shortness of breath with exertion or at rest.  No excess mucus, no productive cough,  No non-productive cough,  No coughing up of blood.  No change in color of mucus.  No wheezing.  No chest wall deformity  Skin: no rash or lesions.  GU: no dysuria, change in color of urine, no urgency or frequency.  No flank pain, no hematuria   MS:  No joint pain or swelling.  No decreased range of motion.  No back pain.    Physical Exam  BP 136/74   Pulse (!) 58   Temp (!) 97.4 F (36.3 C)   Ht 5' 10 (1.778 m)   Wt 230 lb (104.3 kg)   SpO2 97%   BMI 33.00 kg/m   GEN: A/Ox3; pleasant , NAD, well nourished    HEENT:  [place]/AT,  EACs-clear, TMs-wnl, NOSE-clear, THROAT-clear, no lesions, no postnasal drip or exudate noted.   NECK:  Supple w/ fair ROM; no JVD; normal carotid impulses w/o bruits; no thyromegaly or nodules palpated; no lymphadenopathy.    RESP  Clear  P & A; w/o, wheezes/ rales/ or rhonchi. no accessory muscle use, no dullness to percussion  CARD:  RRR, no m/r/g, no peripheral edema, pulses intact, no cyanosis or clubbing.  GI:   Soft & nt; nml bowel sounds; no organomegaly or masses detected.   Musco: Warm bil, no deformities or joint swelling noted.   Neuro: alert, no focal deficits noted.    Skin: Warm, no lesions or rashes    Lab Results:  CBC    Component Value Date/Time   WBC 9.0 06/02/2023  0922   RBC 4.56 06/02/2023 0922   HGB 14.2 06/02/2023 0922   HCT 42.0 06/02/2023 0922   PLT 195.0 06/02/2023 0922   MCV 92.3 06/02/2023 0922   MCH 31.0 11/27/2014 1900   MCHC 33.7 06/02/2023 0922   RDW 14.0 06/02/2023 0922   LYMPHSABS 2.7 06/02/2023 0922   MONOABS 1.1 (H) 06/02/2023 0922   EOSABS 0.5 06/02/2023 0922   BASOSABS 0.1 06/02/2023 0922    BMET    Component Value Date/Time   NA 139 06/02/2023 0922   K 3.9 06/02/2023 0922   CL 104 06/02/2023 0922   CO2 29 06/02/2023 0922   GLUCOSE 124 (H) 06/02/2023 0922   GLUCOSE 103 (H) 01/28/2006 1102   BUN 12 06/02/2023 0922   CREATININE 0.92 06/02/2023 0922   CALCIUM  9.3 06/02/2023 0922   GFRNONAA >60 11/27/2014 1900   GFRAA >60 11/27/2014 1900    BNP No results found for: BNP  ProBNP No results found for: PROBNP  Imaging: No results found.  Administration History     None           No data to display          No results found for: NITRICOXIDE      Assessment & Plan:   Assessment and Plan Assessment & Plan Obstructive sleep apnea   Obstructive sleep apnea is well-controlled with CPAP therapy. CPAP pressures are optimal, and usage is excellent with no issues. The full face mask fits well, and there are no apnea episodes. Continue current CPAP therapy with the full face mask. Ensure the CPAP machine is kept clean and use distilled water in the chamber. Monitor the CPAP machine for replacement in 5-10 years.  Bronchiectasis   Bronchiectasis remains stable with no recent flare-ups, cough, or congestion. Breathing is clear, and there is no shortness of breath. The previous chest x-ray was clear, and a CT scan is not needed at this time. Continue regular physical activity, including daily walking. Perform a chest x-ray every couple of years unless symptoms change.  Atherosclerosis of aorta with ascending aortic aneurysm   Atherosclerosis of the aorta with an ascending aortic aneurysm is stable. The  previous echocardiogram showed good pump function with mild stiffness, expected for age. No significant changes noted. Discuss with Dr. Amon the need for  annual imaging of the aortic aneurysm. Maintain good control of weight, cholesterol, and blood pressure.  Heart murmur, not otherwise specified   A heart murmur was noted by another physician, but echocardiogram results were clear. A mild murmur was detected during examination, likely benign. No immediate intervention is required, continue routine monitoring.  Allergic rhinitis   Allergic rhinitis is present with sinus issues being the primary concern. Symptoms are not severe enough to warrant additional intervention.  Gastroesophageal reflux disease   Gastroesophageal reflux disease has improved with no significant symptoms reported. Occasional flare-ups are noted but manageable. Continue current management and monitor for any changes in symptoms.        Madelin Stank, NP 11/23/2023

## 2023-11-23 NOTE — Patient Instructions (Addendum)
 Continue on CPAP At bedtime  .  Work on healthy weight .  Do not drive if sleepy .  GERD diet  Protonix  daily as needed for reflux .  Continue on Claritin , Astelin  and Flonase  daily As needed   Mucinex DM As needed  Cough/congestion.  Follow up with Dr. Jude or Neema Barreira NP  In 1 year  and As needed

## 2023-12-06 ENCOUNTER — Ambulatory Visit: Admitting: Internal Medicine

## 2023-12-06 VITALS — BP 132/82 | HR 55 | Temp 98.0°F | Resp 16 | Ht 70.0 in | Wt 227.2 lb

## 2023-12-06 DIAGNOSIS — I10 Essential (primary) hypertension: Secondary | ICD-10-CM

## 2023-12-06 DIAGNOSIS — R399 Unspecified symptoms and signs involving the genitourinary system: Secondary | ICD-10-CM

## 2023-12-06 DIAGNOSIS — E1159 Type 2 diabetes mellitus with other circulatory complications: Secondary | ICD-10-CM | POA: Diagnosis not present

## 2023-12-06 DIAGNOSIS — Z7984 Long term (current) use of oral hypoglycemic drugs: Secondary | ICD-10-CM

## 2023-12-06 DIAGNOSIS — Z23 Encounter for immunization: Secondary | ICD-10-CM | POA: Diagnosis not present

## 2023-12-06 DIAGNOSIS — H9190 Unspecified hearing loss, unspecified ear: Secondary | ICD-10-CM

## 2023-12-06 DIAGNOSIS — E785 Hyperlipidemia, unspecified: Secondary | ICD-10-CM

## 2023-12-06 LAB — URINALYSIS, ROUTINE W REFLEX MICROSCOPIC
Bilirubin Urine: NEGATIVE
Hgb urine dipstick: NEGATIVE
Ketones, ur: NEGATIVE
Leukocytes,Ua: NEGATIVE
Nitrite: NEGATIVE
Specific Gravity, Urine: 1.025 (ref 1.000–1.030)
Total Protein, Urine: NEGATIVE
Urine Glucose: NEGATIVE
Urobilinogen, UA: 1 (ref 0.0–1.0)
pH: 5.5 (ref 5.0–8.0)

## 2023-12-06 LAB — BASIC METABOLIC PANEL WITH GFR
BUN: 18 mg/dL (ref 6–23)
CO2: 23 meq/L (ref 19–32)
Calcium: 9.3 mg/dL (ref 8.4–10.5)
Chloride: 105 meq/L (ref 96–112)
Creatinine, Ser: 0.87 mg/dL (ref 0.40–1.50)
GFR: 82.65 mL/min (ref >60.00)
Glucose, Bld: 89 mg/dL (ref 70–99)
Potassium: 4 meq/L (ref 3.5–5.1)
Sodium: 139 meq/L (ref 135–145)

## 2023-12-06 LAB — HEMOGLOBIN A1C: Hgb A1c MFr Bld: 6.8 % — ABNORMAL HIGH (ref 4.6–6.5)

## 2023-12-06 LAB — MICROALBUMIN / CREATININE URINE RATIO
Creatinine,U: 154.8 mg/dL
Microalb Creat Ratio: 12.7 mg/g (ref 0.0–30.0)
Microalb, Ur: 2 mg/dL — ABNORMAL HIGH (ref 0.0–1.9)

## 2023-12-06 LAB — PSA: PSA: 2.12 ng/mL (ref 0.10–4.00)

## 2023-12-06 NOTE — Patient Instructions (Addendum)
 GO TO THE LAB :  Get the blood work    Then, go to the front desk for the checkout Please make an appointment for a physical  exam in April 2026   You got a flu shot today Consider COVID booster  We are referring you to out urologist  Check the  blood pressure regularly Blood pressure goal:  between 110/65 and  135/85. If it is consistently higher or lower, let me know

## 2023-12-06 NOTE — Assessment & Plan Note (Signed)
 BPH-LUTS: As described above,  sxs are  manageable, medication available if nocturia worsens.  DRE essentially normal except for slightly increased prostate size. - Check PSA level, UA urine culture DM type II with coronary calcifications, obesity, HTN, OSA  managed with metformin .  Last A1c went from 6.5-6.8.  Check A1c- Check A1c level. HTN Managed with telmisartan, metoprolol , and amlodipine . Advised periodic blood pressure checks.  BMP Hyperlipidemia Managed with atorvastatin  and fenofibrate .  Last LDL 65 Bronchiectasis: Saw pulmonary 11/23/2023. HOH: Associated with chronic, stable tinnitus.  Referred to audiology, some wax noted on exam, declined a lavage, recommend peroxide regularly  Vaccine advised: Flu shot today, consider COVID booster RTC CPX 05/2024

## 2023-12-06 NOTE — Progress Notes (Signed)
 Subjective:    Patient ID: Jeffery Cline, male    DOB: August 09, 1945, 78 y.o.   MRN: [id number]  DOS:  12/06/2023 Follow-up  Discussed the use of AI scribe software for clinical note transcription with the patient, who gave verbal consent to proceed.  History of Present Illness Jeffery Cline is a 78 year old male who presents for a routine follow-up visit.  Auditory disturbances - Hearing difficulty primarily observed by his wife, with background noise blending in - No difficulty in one-on-one conversations - Stable tinnitus  Nocturia - Urinates three to four times nightly - No dysuria, hematuria, or difficulty urinating  Diabetes mellitus management - Managed with metformin  twice daily - Does not regularly monitor blood glucose levels  Hypertension management - Takes Micardis, metoprolol , and amlodipine  - Does not frequently check blood pressure, relies on symptoms to indicate elevation  Hyperlipidemia management - Takes Lipitor 80 mg and Tricor  145 mg  General health maintenance - Walks two miles every morning - Feels good overall - Received multiple COVID-19 vaccines, last dose over a year ago - Recent colonoscopy and echocardiogram were normal    Review of Systems See above   Past Medical History:  Diagnosis Date   Allergic rhinitis    Allergy     Basal cell carcinoma 2016   Right medial posterior shoulder, shave and ED&C   Cataract    forming    Decreased carotid pulse    decreased L carotid pulse (?) : Nl carotidu/s 02-2006     Diabetes mellitus without complication (HCC)    ED (erectile dysfunction)    GERD (gastroesophageal reflux disease)    Glaucoma    Hyperlipidemia    Hypertension    SCC (squamous cell carcinoma)    Sleep apnea    on CPAP    Past Surgical History:  Procedure Laterality Date   CYSTECTOMY     back, recurrent, last 01-2016   INGUINAL HERNIA REPAIR     right by dr. vernetta   MOHS SURGERY Left 11/2018   face,  betwen nose-ear    PILONIDAL CYST EXCISION     POLYPECTOMY     TONSILLECTOMY      Current Outpatient Medications  Medication Instructions   amLODipine  (NORVASC ) 5 mg, Oral, Daily   aspirin EC 81 mg, Daily   atorvastatin  (LIPITOR) 80 mg, Oral, Daily at bedtime   azelastine  (ASTELIN ) 0.1 % nasal spray 2 sprays, 2 times daily   cetirizine  (ZYRTEC  ALLERGY ) 10 mg, Oral, Daily   COMBIGAN 0.2-0.5 % ophthalmic solution INSTILL 1 DROP IN BOTH EYES TWICE A DAY   fenofibrate  (TRICOR ) 145 mg, Oral, Daily   metFORMIN  (GLUCOPHAGE ) 1,000 mg, Oral, 2 times daily with meals   metoprolol  tartrate (LOPRESSOR ) 50 mg, Oral, 2 times daily   mupirocin ointment (BACTROBAN) 2 % 1 Application   pantoprazole  (PROTONIX ) 40 mg, Daily before breakfast   telmisartan (MICARDIS) 80 mg, Oral, Daily       Objective:   Physical Exam BP 132/82   Pulse (!) 55   Temp 98 F (36.7 C) (Oral)   Resp 16   Ht 5' 10 (1.778 m)   Wt 227 lb 4 oz (103.1 kg)   SpO2 97%   BMI 32.61 kg/m  General:   Well developed, NAD, BMI noted.  HEENT:  Normocephalic . Face symmetric, atraumatic Ears: + Wax, no impaction Lungs:  CTA B Normal respiratory effort, no intercostal retractions, no accessory muscle use. Heart: RRR,  no murmur.  Abdomen:  Not distended, soft, non-tender. No rebound or rigidity. DRE: Brown stools, normal sphincter tone, prostate is slightly enlarged, nontender, no nodularity. Skin: Not pale. Not jaundice Lower extremities: no pretibial edema bilaterally  Neurologic:  alert & oriented X3.  Speech normal, gait appropriate for age and unassisted Psych--  Cognition and judgment appear intact.  Cooperative with normal attention span and concentration.  Behavior appropriate. No anxious or depressed appearing.     Assessment     Assessment DM  A1c 6.5 (05-2016)  Obesity HTN Hyperlipidemia Coronary calcifications: Per CT, 01/2020 (Rx CV RF) OSA -- on CPAP , rx Dr Jude GERD ( dx based on response to  cough to PPIs) [facility] 2016- sees derm q year Dr Joshua as off 05-2023 Glaucoma    BPH Chest 01/2020: Bronchiectasis.  Scattered solid pulmonary nodules stable.  Three-vessel CAD.  Hepatic steatosis.  Ectatic ascending thoracic aorta unchanged Low testosterone : Saw endocrinology 06/2014 , mild hypogonadotropic hypogonadism. no rx recommended  Decreased libido , ED: not interested in Rx Decreased L carotid pulse? Normal carotid ultrasound 2008  Assessment & Plan BPH-LUTS: As described above,  sxs are  manageable, medication available if nocturia worsens.  DRE essentially normal except for slightly increased prostate size. - Check PSA level, UA urine culture DM type II with coronary calcifications, obesity, HTN, OSA  managed with metformin .  Last A1c went from 6.5-6.8.  Check A1c- Check A1c level. HTN Managed with telmisartan, metoprolol , and amlodipine . Advised periodic blood pressure checks.  BMP Hyperlipidemia Managed with atorvastatin  and fenofibrate .  Last LDL 65 Bronchiectasis: Saw pulmonary 11/23/2023. HOH: Associated with chronic, stable tinnitus.  Referred to audiology, some wax noted on exam, declined a lavage, recommend peroxide regularly  Vaccine advised: Flu shot today, consider COVID booster RTC CPX 05/2024

## 2023-12-07 LAB — URINE CULTURE
MICRO NUMBER:: 17151059
Result:: NO GROWTH
SPECIMEN QUALITY:: ADEQUATE

## 2023-12-09 ENCOUNTER — Ambulatory Visit: Payer: Self-pay | Admitting: Internal Medicine

## 2023-12-09 DIAGNOSIS — R82998 Other abnormal findings in urine: Secondary | ICD-10-CM

## 2023-12-20 ENCOUNTER — Other Ambulatory Visit: Payer: Self-pay | Admitting: Internal Medicine

## 2023-12-29 ENCOUNTER — Other Ambulatory Visit

## 2024-01-10 ENCOUNTER — Other Ambulatory Visit (INDEPENDENT_AMBULATORY_CARE_PROVIDER_SITE_OTHER)

## 2024-01-10 DIAGNOSIS — R82998 Other abnormal findings in urine: Secondary | ICD-10-CM

## 2024-01-10 LAB — URINALYSIS, ROUTINE W REFLEX MICROSCOPIC
Bilirubin Urine: NEGATIVE
Hgb urine dipstick: NEGATIVE
Ketones, ur: NEGATIVE
Leukocytes,Ua: NEGATIVE
Nitrite: NEGATIVE
RBC / HPF: NONE SEEN (ref 0–?)
Specific Gravity, Urine: 1.025 (ref 1.000–1.030)
Total Protein, Urine: NEGATIVE
Urine Glucose: NEGATIVE
Urobilinogen, UA: 1 (ref 0.0–1.0)
pH: 6 (ref 5.0–8.0)

## 2024-01-11 ENCOUNTER — Ambulatory Visit: Payer: Self-pay | Admitting: Family

## 2024-01-11 LAB — URINE CULTURE
MICRO NUMBER:: 17295518
Result:: NO GROWTH
SPECIMEN QUALITY:: ADEQUATE

## 2024-02-01 ENCOUNTER — Ambulatory Visit (INDEPENDENT_AMBULATORY_CARE_PROVIDER_SITE_OTHER): Admitting: Physician Assistant

## 2024-02-01 ENCOUNTER — Encounter (INDEPENDENT_AMBULATORY_CARE_PROVIDER_SITE_OTHER): Payer: Self-pay | Admitting: Physician Assistant

## 2024-02-01 VITALS — BP 134/75 | HR 56 | Ht 69.0 in | Wt 220.0 lb

## 2024-02-01 DIAGNOSIS — J309 Allergic rhinitis, unspecified: Secondary | ICD-10-CM

## 2024-02-01 DIAGNOSIS — H9041 Sensorineural hearing loss, unilateral, right ear, with unrestricted hearing on the contralateral side: Secondary | ICD-10-CM | POA: Diagnosis not present

## 2024-02-01 DIAGNOSIS — H918X3 Other specified hearing loss, bilateral: Secondary | ICD-10-CM

## 2024-02-01 DIAGNOSIS — H6121 Impacted cerumen, right ear: Secondary | ICD-10-CM | POA: Diagnosis not present

## 2024-02-01 DIAGNOSIS — J329 Chronic sinusitis, unspecified: Secondary | ICD-10-CM | POA: Diagnosis not present

## 2024-02-01 NOTE — Progress Notes (Signed)
 Dear Dr. Isaias, Here is my assessment for our mutual patient, Jeffery Cline. Thank you for allowing me the opportunity to care for your patient. Please do not hesitate to contact me should you have any other questions. Sincerely, Chyrl Cohen PA-C  Otolaryngology Clinic Note Referring provider: Dr. Isaias HPI:  Jeffery Cline is a 78 y.o. male kindly referred by Dr. Isaias   Discussed the use of AI scribe software for clinical note transcription with the patient, who gave verbal consent to proceed.  History of Present Illness   Jeffery Cline is a 78 year old male with right-sided sensorineural hearing loss who presents for evaluation of hearing loss.  He was referred by an audiologist after his wife observed hearing difficulties. Audiometric testing performed by the audiologist showed hearing loss in the right ear. He is not personally aware of significant hearing impairment but acknowledges that hearing loss can be insidious and that family members have noticed issues. He experiences difficulty hearing in environments with significant background noise and must concentrate to distinguish voices from ambient noise. He denies otalgia, vertigo, or prior otologic surgery. He produces significant cerumen and manages wax at home with a bulb syringe and drops, though he has not cleaned his ears recently. He has not previously used hearing aids and is considering them following the audiologist's recommendation.  He describes chronic sinus symptoms characterized by intermittent nasal congestion, sensation of fullness, and subjective cognitive impairment described as brain fog. Symptoms fluctuate in severity and are worse in the fall and spring. He uses over-the-counter allergy  medications and expectorants, such as Mucinex, approximately twice weekly, but does not use prescription therapies. He has not been formally diagnosed with allergic rhinitis. He uses a CPAP device, which he finds  beneficial for sleep.           Independent Review of Additional Tests or Records:  Audiological evaluation on 12/16/2018 5 AM hearing asymmetric sensorineural hearing loss       PMH/Meds/All/SocHx/FamHx/ROS:   Past Medical History:  Diagnosis Date   Allergic rhinitis    Allergy     Basal cell carcinoma 2016   Right medial posterior shoulder, shave and ED&C   Cataract    forming    Decreased carotid pulse    decreased L carotid pulse (?) : Nl carotidu/s 02-2006     Diabetes mellitus without complication (HCC)    ED (erectile dysfunction)    GERD (gastroesophageal reflux disease)    Glaucoma    Hyperlipidemia    Hypertension    SCC (squamous cell carcinoma)    Sleep apnea    on CPAP     Past Surgical History:  Procedure Laterality Date   CYSTECTOMY     back, recurrent, last 01-2016   INGUINAL HERNIA REPAIR     right by dr. vernetta   MOHS SURGERY Left 11/2018   face, betwen nose-ear    PILONIDAL CYST EXCISION     POLYPECTOMY     TONSILLECTOMY      Family History  Problem Relation Age of Onset   Dementia Mother    Alcohol abuse Mother    Coronary artery disease Father 51   Coronary artery disease Maternal Grandfather    Leukemia Paternal Grandfather    Colon cancer Neg Hx    Prostate cancer Neg Hx    Colon polyps Neg Hx    Rectal cancer Neg Hx    Stomach cancer Neg Hx    Esophageal cancer Neg Hx  Social Connections: Unknown (12/06/2023)   Social Connection and Isolation Panel    Frequency of Communication with Friends and Family: More than three times a week    Frequency of Social Gatherings with Friends and Family: Three times a week    Attends Religious Services: More than 4 times per year    Active Member of Clubs or Organizations: Not on file    Attends Banker Meetings: Not on file    Marital Status: Married     Current Medications[1]   Physical Exam:   BP 134/75   Pulse (!) 56   Ht 5' 9 (1.753 m)   Wt 220 lb (99.8 kg)    SpO2 95%   BMI 32.49 kg/m   Pertinent Findings  CN II-XII grossly intact Bilateral EAC clear and TM intact with well pneumatized middle ear spaces Weber 512: equal Rinne 512: AC > BC b/l  Anterior rhinoscopy: Septum midline; bilateral inferior turbinates with mild hypertrophy No lesions of oral cavity/oropharynx; dentition in normal limits No obviously palpable neck masses/lymphadenopathy/thyromegaly No respiratory distress or stridor        Seprately Identifiable Procedures:  Procedure: bilateral ear microscopy and cerumen removal using microscope (CPT 30789) - Mod 25 Pre-procedure diagnosis: unilateral cerumen impaction right external auditory canal Post-procedure diagnosis: same Indication:right cerumen impaction; given patient's otologic complaints and history as well as for improved and comprehensive examination of external ear and tympanic membrane, bilateral otologic examination using microscope was performed and impacted cerumen removed  Procedure: Patient was placed semi-recumbent. Both ear canals were examined using the microscope with findings above. Cerumen removed from the right external auditory canal using suction and currette with improvement in EAC examination and patency. Left: EAC was patent. TM was intact . Middle ear was aerated. Drainage: none Right: EAC was patent. TM was intact . Middle ear was aerated . Drainage: none Patient tolerated the procedure well.   Impression & Plans:  Jeffery Cline is a 78 y.o. male with the following   Assessment and Plan    Right-sided sensorineural hearing loss Audiologic testing confirmed right-sided sensorineural hearing loss with greater impairment on the right. The degree of asymmetry warranted evaluation for retrocochlear pathology, such as vestibular schwannoma, though this remains extremely rare. He was a candidate for hearing aids with anticipated improvement in group settings and environments with background  noise, though hearing aids may not fully resolve difficulty in separating voices from ambient noise. - Ordered MRI of the internal auditory canals and brain to evaluate for retrocochlear pathology. - Provided contact information for MRI scheduling. - Planned follow-up telephone visit to review MRI results. - Recommended proceeding with hearing aids after MRI results are reviewed.  Impacted cerumen, right ear Impacted cerumen in the right ear may contribute to hearing loss. He produces prolific cerumen and has attempted self-care, but in-clinic removal is preferred to avoid injury. - Performed cerumen removal of the right ear in clinic using suction. - Advised to contact office for future cerumen impaction rather than attempting self-removal.  Allergic rhinitis with chronic sinusitis He experiences lifelong intermittent sinus congestion and nasal obstruction, with mild nasal mucosal swelling likely due to allergic rhinitis, possibly exacerbated by CPAP use. Symptoms are generally mild and do not significantly impact quality of life. - Recommended regular saline nasal irrigation as first-line therapy. - Discussed stepwise escalation to intranasal steroids and daily antihistamines if symptoms worsen. - Advised to continue current management if symptoms remain mild and not bothersome.           -  f/u phone office visit with MRI results   Thank you for allowing me the opportunity to care for your patient. Please do not hesitate to contact me should you have any other questions.  Sincerely, Chyrl Cohen PA-C [facility] Phone: [phone] Fax: [phone]  02/01/2024, 12:36 PM        [1]  Current Outpatient Medications:    amLODipine  (NORVASC ) 5 MG tablet, Take 1 tablet (5 mg total) by mouth daily., Disp: 90 tablet, Rfl: 1   aspirin EC 81 MG tablet, Take 81 mg by mouth daily. Swallow whole., Disp: , Rfl:    atorvastatin  (LIPITOR) 80 MG tablet, Take 1 tablet (80 mg  total) by mouth at bedtime., Disp: 90 tablet, Rfl: 1   azelastine  (ASTELIN ) 0.1 % nasal spray, Place 2 sprays into both nostrils 2 (two) times daily. AS needed (Patient not taking: Reported on 12/06/2023), Disp: , Rfl:    cetirizine  (ZYRTEC  ALLERGY ) 10 MG tablet, Take 1 tablet (10 mg total) by mouth daily. (Patient taking differently: Take 10 mg by mouth as needed.), Disp: 30 tablet, Rfl: 0   COMBIGAN 0.2-0.5 % ophthalmic solution, INSTILL 1 DROP IN BOTH EYES TWICE A DAY, Disp: , Rfl: 3   fenofibrate  (TRICOR ) 145 MG tablet, Take 1 tablet (145 mg total) by mouth daily., Disp: 90 tablet, Rfl: 1   metFORMIN  (GLUCOPHAGE ) 1000 MG tablet, Take 1 tablet (1,000 mg total) by mouth 2 (two) times daily with a meal., Disp: 180 tablet, Rfl: 1   metoprolol  tartrate (LOPRESSOR ) 50 MG tablet, Take 1 tablet (50 mg total) by mouth 2 (two) times daily., Disp: 180 tablet, Rfl: 1   mupirocin ointment (BACTROBAN) 2 %, Apply 1 Application topically. As needed (Patient not taking: Reported on 12/06/2023), Disp: , Rfl:    pantoprazole  (PROTONIX ) 40 MG tablet, Take 40 mg by mouth daily before breakfast. PRN, Disp: , Rfl:    telmisartan (MICARDIS) 80 MG tablet, Take 1 tablet (80 mg total) by mouth daily., Disp: 90 tablet, Rfl: 1

## 2024-02-01 NOTE — Patient Instructions (Signed)
 I have ordered an imaging study for you to complete prior to your next visit. Please call [facility] Scheduling at [phone] to schedule your imaging if you have not received a call within 24 hours. If you are unable to complete your imaging study prior to your next scheduled visit please call our office to let us  know.     Please call after MRI is complete and schedule phone visit

## 2024-02-04 ENCOUNTER — Ambulatory Visit (HOSPITAL_COMMUNITY)
Admission: RE | Admit: 2024-02-04 | Discharge: 2024-02-04 | Disposition: A | Discharge status: Home / Self Care | Source: Ambulatory Visit | Attending: Physician Assistant | Admitting: Physician Assistant

## 2024-02-04 DIAGNOSIS — H918X3 Other specified hearing loss, bilateral: Secondary | ICD-10-CM | POA: Insufficient documentation

## 2024-02-04 DIAGNOSIS — H918X9 Other specified hearing loss, unspecified ear: Secondary | ICD-10-CM | POA: Diagnosis not present

## 2024-02-04 MED ORDER — GADOBUTROL 1 MMOL/ML IV SOLN
10.0000 mL | Freq: Once | INTRAVENOUS | Status: AC | PRN
  Administered 2024-02-04: 10 mL via INTRAVENOUS

## 2024-02-14 ENCOUNTER — Ambulatory Visit (INDEPENDENT_AMBULATORY_CARE_PROVIDER_SITE_OTHER): Admitting: Physician Assistant

## 2024-02-14 DIAGNOSIS — H918X3 Other specified hearing loss, bilateral: Secondary | ICD-10-CM

## 2024-02-14 NOTE — Progress Notes (Signed)
 I spoke with the patient, MRI results not available at this time. I will call the patient once they are available.

## 2024-02-15 ENCOUNTER — Telehealth (INDEPENDENT_AMBULATORY_CARE_PROVIDER_SITE_OTHER): Payer: Self-pay | Admitting: Physician Assistant

## 2024-02-15 NOTE — Telephone Encounter (Signed)
 MRI normal, patient informed. No questions, follow up PRN.

## 2024-06-05 ENCOUNTER — Encounter: Admitting: Internal Medicine
